# Patient Record
Sex: Female | Born: 1995 | Race: White | Hispanic: No | Marital: Single | Smoking: Former smoker
Health system: Southern US, Community
[De-identification: ages and names within clinical notes are randomized; demographics above are authoritative.]

## PROBLEM LIST (undated history)

## (undated) DIAGNOSIS — O418X10 Other specified disorders of amniotic fluid and membranes, first trimester, not applicable or unspecified: Secondary | ICD-10-CM

## (undated) DIAGNOSIS — R569 Unspecified convulsions: Secondary | ICD-10-CM

## (undated) DIAGNOSIS — G43909 Migraine, unspecified, not intractable, without status migrainosus: Secondary | ICD-10-CM

## (undated) DIAGNOSIS — Z87442 Personal history of urinary calculi: Secondary | ICD-10-CM

## (undated) DIAGNOSIS — Z62820 Parent-biological child conflict: Secondary | ICD-10-CM

## (undated) DIAGNOSIS — M1990 Unspecified osteoarthritis, unspecified site: Secondary | ICD-10-CM

## (undated) DIAGNOSIS — N83209 Unspecified ovarian cyst, unspecified side: Secondary | ICD-10-CM

## (undated) DIAGNOSIS — F419 Anxiety disorder, unspecified: Secondary | ICD-10-CM

## (undated) DIAGNOSIS — F1910 Other psychoactive substance abuse, uncomplicated: Secondary | ICD-10-CM

## (undated) DIAGNOSIS — O468X1 Other antepartum hemorrhage, first trimester: Secondary | ICD-10-CM

## (undated) DIAGNOSIS — R87629 Unspecified abnormal cytological findings in specimens from vagina: Secondary | ICD-10-CM

## (undated) DIAGNOSIS — I10 Essential (primary) hypertension: Secondary | ICD-10-CM

## (undated) DIAGNOSIS — F909 Attention-deficit hyperactivity disorder, unspecified type: Secondary | ICD-10-CM

## (undated) HISTORY — DX: Unspecified abnormal cytological findings in specimens from vagina: R87.629

## (undated) HISTORY — DX: Unspecified ovarian cyst, unspecified side: N83.209

## (undated) HISTORY — PX: WISDOM TOOTH EXTRACTION: SHX21

## (undated) HISTORY — DX: Other antepartum hemorrhage, first trimester: O46.8X1

## (undated) HISTORY — DX: Migraine, unspecified, not intractable, without status migrainosus: G43.909

## (undated) HISTORY — PX: EXTRACORPOREAL SHOCK WAVE LITHOTRIPSY: SHX1557

## (undated) HISTORY — PX: COLPOSCOPY: SHX161

## (undated) HISTORY — DX: Anxiety disorder, unspecified: F41.9

## (undated) HISTORY — DX: Attention-deficit hyperactivity disorder, unspecified type: F90.9

## (undated) HISTORY — DX: Other specified disorders of amniotic fluid and membranes, first trimester, not applicable or unspecified: O41.8X10

## (undated) HISTORY — DX: Parent-biological child conflict: Z62.820

## (undated) HISTORY — DX: Unspecified convulsions: R56.9

---
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
## 2015-03-18 ENCOUNTER — Ambulatory Visit (INDEPENDENT_AMBULATORY_CARE_PROVIDER_SITE_OTHER): Payer: Medicaid Other | Admitting: *Deleted

## 2015-03-18 ENCOUNTER — Encounter: Payer: Self-pay | Admitting: *Deleted

## 2015-03-18 DIAGNOSIS — Z3202 Encounter for pregnancy test, result negative: Secondary | ICD-10-CM

## 2015-03-18 DIAGNOSIS — Z3042 Encounter for surveillance of injectable contraceptive: Secondary | ICD-10-CM

## 2015-03-18 LAB — POCT URINE PREGNANCY: PREG TEST UR: NEGATIVE

## 2015-03-18 MED ORDER — MEDROXYPROGESTERONE ACETATE 150 MG/ML IM SUSP
150.0000 mg | Freq: Once | INTRAMUSCULAR | Status: AC
  Administered 2015-03-18: 150 mg via INTRAMUSCULAR

## 2015-03-18 NOTE — Progress Notes (Signed)
Pt here for Depo. Pt's urine was dark in color. I dipped it and it showed some blood. Pt has a history of kidney stones. Pt not having any pain. I advised to drink plenty of water, flush system out and if she starts having pain, call us or her PCP. Pt voiced understanding. Return in 12 weeks for next shot. JSY

## 2015-06-10 ENCOUNTER — Ambulatory Visit (INDEPENDENT_AMBULATORY_CARE_PROVIDER_SITE_OTHER): Payer: Medicaid Other | Admitting: *Deleted

## 2015-06-10 ENCOUNTER — Encounter: Payer: Self-pay | Admitting: *Deleted

## 2015-06-10 DIAGNOSIS — Z3042 Encounter for surveillance of injectable contraceptive: Secondary | ICD-10-CM | POA: Diagnosis not present

## 2015-06-10 DIAGNOSIS — Z3202 Encounter for pregnancy test, result negative: Secondary | ICD-10-CM | POA: Diagnosis not present

## 2015-06-10 LAB — POCT URINE PREGNANCY: PREG TEST UR: NEGATIVE

## 2015-06-10 MED ORDER — MEDROXYPROGESTERONE ACETATE 150 MG/ML IM SUSP
150.0000 mg | Freq: Once | INTRAMUSCULAR | Status: AC
  Administered 2015-06-10: 150 mg via INTRAMUSCULAR

## 2015-06-10 NOTE — Progress Notes (Signed)
Pt here for Depo. Reports no problems at this time. Return in 12 weeks for next shot. JSY 

## 2015-06-22 ENCOUNTER — Ambulatory Visit: Payer: Medicaid Other | Admitting: Obstetrics & Gynecology

## 2015-06-28 ENCOUNTER — Encounter: Payer: Self-pay | Admitting: Obstetrics & Gynecology

## 2015-06-28 ENCOUNTER — Ambulatory Visit: Payer: Medicaid Other | Admitting: Obstetrics & Gynecology

## 2015-07-10 ENCOUNTER — Emergency Department (HOSPITAL_COMMUNITY)
Admission: EM | Admit: 2015-07-10 | Discharge: 2015-07-10 | Disposition: A | Discharge status: Home / Self Care | Payer: Medicaid Other | Attending: Emergency Medicine | Admitting: Emergency Medicine

## 2015-07-10 ENCOUNTER — Encounter (HOSPITAL_COMMUNITY): Payer: Self-pay | Admitting: Emergency Medicine

## 2015-07-10 DIAGNOSIS — Z8742 Personal history of other diseases of the female genital tract: Secondary | ICD-10-CM | POA: Insufficient documentation

## 2015-07-10 DIAGNOSIS — Z8679 Personal history of other diseases of the circulatory system: Secondary | ICD-10-CM | POA: Diagnosis not present

## 2015-07-10 DIAGNOSIS — K088 Other specified disorders of teeth and supporting structures: Secondary | ICD-10-CM | POA: Diagnosis present

## 2015-07-10 DIAGNOSIS — F419 Anxiety disorder, unspecified: Secondary | ICD-10-CM | POA: Insufficient documentation

## 2015-07-10 DIAGNOSIS — J45909 Unspecified asthma, uncomplicated: Secondary | ICD-10-CM | POA: Diagnosis not present

## 2015-07-10 DIAGNOSIS — K0381 Cracked tooth: Secondary | ICD-10-CM | POA: Diagnosis not present

## 2015-07-10 DIAGNOSIS — Z87442 Personal history of urinary calculi: Secondary | ICD-10-CM | POA: Diagnosis not present

## 2015-07-10 DIAGNOSIS — S02609A Fracture of mandible, unspecified, initial encounter for closed fracture: Secondary | ICD-10-CM

## 2015-07-10 MED ORDER — KETOROLAC TROMETHAMINE 10 MG PO TABS
10.0000 mg | ORAL_TABLET | Freq: Once | ORAL | Status: AC
  Administered 2015-07-10: 10 mg via ORAL
  Filled 2015-07-10: qty 1

## 2015-07-10 MED ORDER — MELOXICAM 15 MG PO TABS: 15.0000 mg | ORAL_TABLET | Freq: Every day | ORAL | Status: DC

## 2015-07-10 MED ORDER — TRAMADOL HCL 50 MG PO TABS: 50.0000 mg | ORAL_TABLET | Freq: Four times a day (QID) | ORAL | Status: DC | PRN

## 2015-07-10 MED ORDER — CLINDAMYCIN HCL 150 MG PO CAPS: ORAL_CAPSULE | ORAL | Status: DC

## 2015-07-10 MED ORDER — PROMETHAZINE HCL 12.5 MG PO TABS
12.5000 mg | ORAL_TABLET | Freq: Once | ORAL | Status: AC
  Administered 2015-07-10: 12.5 mg via ORAL
  Filled 2015-07-10: qty 1

## 2015-07-10 MED ORDER — CLINDAMYCIN HCL 150 MG PO CAPS
300.0000 mg | ORAL_CAPSULE | Freq: Once | ORAL | Status: AC
  Administered 2015-07-10: 300 mg via ORAL
  Filled 2015-07-10: qty 2

## 2015-07-10 MED ORDER — PROMETHAZINE HCL 12.5 MG PO TABS: 12.5000 mg | ORAL_TABLET | Freq: Four times a day (QID) | ORAL | Status: DC | PRN

## 2015-07-10 MED ORDER — TRAMADOL HCL 50 MG PO TABS: ORAL_TABLET | ORAL | Status: DC

## 2015-07-10 MED ORDER — TRAMADOL HCL 50 MG PO TABS
100.0000 mg | ORAL_TABLET | Freq: Once | ORAL | Status: AC
  Administered 2015-07-10: 100 mg via ORAL
  Filled 2015-07-10: qty 2
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
## 2015-07-10 NOTE — ED Provider Notes (Signed)
CSN: [id number]     Arrival date & time 07/10/15  1236 History   First MD Initiated Contact with Patient 07/10/15 1357     Chief Complaint  Patient presents with  . Dental Pain     (Consider location/radiation/quality/duration/timing/severity/associated sxs/prior Treatment) HPI Comments: Patient is a 19 year old Andrea [name] who presents to the emergency department with lower jaw area pain.  The patient states that on Friday, July 1 she had wisdom teeth removed. She continued to have problems and was seen in the dentist's office on Wednesday, July 27. She was told that time that the bottom jaw crack as a result of the wisdom tooth surgery. The patient has an appointment with an oral surgeon on next week, but presents to the emergency department because of pain and discomfort.There has  been no high fever reported. His been no other injury or trauma to the lower jaw area. Patient does complain of difficulty with eating due to the pain of her jaw. She was treated with 3 days of ultram, but states her appointment is not until next week.  Patient is a 19 y.o. Andrea [name] presenting with tooth pain. The history is provided by the patient.  Dental Pain Location:  Lower Associated symptoms: headaches     Past Medical History  Diagnosis Date  . ADHD (attention deficit hyperactivity disorder)   . Anxiety   . Parent-child relational problem   . Asthma   . Kidney stones   . Ovarian cyst   . Migraines    Past Surgical History  Procedure Laterality Date  . Extracorporeal shock wave lithotripsy     Family History  Problem Relation Age of Onset  . Heart disease Other   . Birth defects Other   . Cancer Other   . Diabetes Other    History  Substance Use Topics  . Smoking status: Never Smoker   . Smokeless tobacco: Never Used  . Alcohol Use: No   OB History    No data available     Review of Systems  HENT: Positive for dental problem.   Neurological: Positive for headaches.   Psychiatric/Behavioral: The patient is nervous/anxious.   All other systems reviewed and are negative.     Allergies  Tylenol  Home Medications   Prior to Admission medications   Medication Sig Start Date End Date Taking? Authorizing Provider  medroxyPROGESTERone (DEPO-PROVERA) 150 MG/ML injection INJECT 1 VIAL INTRAMUSCULARLY EVERY 3 MONTHS IN OFFICE. 12/23/14   Adline Potter, NP   BP 143/88 mmHg  Pulse 83  Temp(Src) 98.4 F (36.9 C) (Oral)  Resp 20  Ht 4\' 9"  (1.448 m)  Wt 149 lb (67.586 kg)  BMI 32.23 kg/m2  SpO2 100% Physical Exam  Constitutional: She is oriented to person, place, and time. She appears well-developed and well-nourished.  Non-toxic appearance.  HENT:  Head: Normocephalic.  Right Ear: Tympanic membrane and external ear normal.  Left Ear: Tympanic membrane and external ear normal.  There is swelling of the left lower gum. There is pain to palpation of the left lower jaw. There is mild swelling present. The airway is patent. There is no drooling, no trismus, speech is clear and understandable.  Eyes: EOM and lids are normal. Pupils are equal, round, and reactive to light.  Neck: Normal range of motion. Neck supple. Carotid bruit is not present.  Cardiovascular: Normal rate, regular rhythm, normal heart sounds, intact distal pulses and normal pulses.   Pulmonary/Chest: Breath sounds normal. No respiratory distress.  Abdominal: Soft.  Bowel sounds are normal. There is no tenderness. There is no guarding.  Musculoskeletal: Normal range of motion.  Lymphadenopathy:       Head (right side): No submandibular adenopathy present.       Head (left side): No submandibular adenopathy present.    She has no cervical adenopathy.  Neurological: She is alert and oriented to person, place, and time. She has normal strength. No cranial nerve deficit or sensory deficit.  Skin: Skin is warm and dry.  Psychiatric: She has a normal mood and affect. Her speech is normal.   Nursing note and vitals reviewed.   ED Course  Procedures (including critical care time) Labs Review Labs Reviewed - No data to display  Imaging Review No results found.   EKG Interpretation None      MDM  Vital signs within normal limits. The patient states she has had "green drainage" from the excision site of the wisdom teeth. Patient will be treated with Amoxil 500 mg 3 times daily. The patient complains of increasing pain, she is allergic to Tylenol. Will continue the Ultram, and add Mobic to assist with her discomfort and for inflammation. Patient will follow-up with the oral surgeon next week as scheduled.    Final diagnoses:  None    **I have reviewed nursing notes, vital signs, and all appropriate lab and imaging results for this patient.Ivery Quale, PA-C 07/12/15 Avon Gully, MD 07/20/15 [phone]

## 2015-07-10 NOTE — Discharge Instructions (Signed)
Please use clindamycin 3 times daily, mobile daily with food, Ultram every 6 hours as needed for pain. Please see your oral surgeon as previously scheduled. High protein liquids like boost, [employer], or similar products may be helpful until you're able to chew effectively. Fractured-Jaw Meal Plan The purpose of the fractured-jaw meal plan is to provide foods that can be easily blended and easily swallowed. This plan is typically used after jaw or mouth surgery, wired jaw surgery, or dental surgery. Foods in this plan need to be blended so that they can be sipped from a straw or given through a syringe. You should try to have at least three meals and three snacks daily. It is important to make sure you get enough calories and protein to prevent weight loss and help your body heal, especially after surgery. You may wish to include a liquid multivitamin in your plan to ensure that you get all the vitamins and minerals you need. Ask your health care provider for a recommendation.  HOW DO I PREPARE MY MEALS? All foods in this plan must be blended. Avoid nuts, seeds, skins, peels, bones, or any foods that cannot be blended to the right consistency. Make sure to eat a variety of foods from each food group every day. The following tips can help you as you blend your food:  Remove skins, seeds, and peels from food.  Cook meats and vegetables thoroughly.  Cut foods into small pieces and mix with a small amount of liquid in a food processor or blender. Continue to add liquid until the food becomes thin enough to sip through a straw.  Adding liquids such as juice, milk, cream, broth, gravy, or vegetable juice can help add flavor to foods.  Heat foods after they have been blended to reduce the amount of foam created from blending.  Heat or cool your foods to lukewarm temperatures if your teeth and mouth are sensitive to extreme temperatures. WHAT FOODS CAN I EAT? Make sure to eat a variety  of foods from each food group.  Grains  Hot cereals, such as oatmeal, grits, ground wheat cereals, and polenta.  Rice and pasta.  Couscous. Vegetables  All cooked or canned vegetables, without seeds and skins.  Vegetable juices.  Cooked potatoes, without skins. Fruit  Any cooked or canned fruits, without seeds and skins.  Fresh, peeled soft fruits, such as bananas and peaches, that can be blended until smooth.  All fruit juices, without seeds and skins. Meat and Other Protein Sources  Soft-boiled eggs, scrambled eggs, powdered eggs, pasteurized egg mixtures, and custard.  Ground meats, such as hamburger, [place], sausage, and meatloaf.  Tender, well-cooked meat, poultry, and fish prepared without bones or skin.  Soft soy foods (such as tofu).  Smooth nut butters. Dairy  All are allowed. Beverages  Coffee (regular or decaffeinated), tea, and mineral water. Condiments  All seasonings and condiments that blend well. WHEN MAY I NEED TO SUPPLEMENT MY MEALS? If you begin to lose weight on this plan, you may need to increase the amount of food you are eating or the number of calories in your food or both. You can increase the number of calories by adding any of the following foods:  Protein powder or powdered milk.  Extra fats, such as margarine (without trans fat), sour cream, cream cheese, cream, and nut butters, such as peanut butter or almond butter.  Sweets, such as honey, ice cream, blackstrap molasses, or sugar. Document Released: 05/17/2010 Document Revised:  04/13/2014 Document Reviewed: 10/24/2013 ExitCare Patient Information 2015 [place], [place]. This information is not intended to replace advice given to you by your health care provider. Make sure you discuss any questions you have with your health care provider.

## 2015-09-02 ENCOUNTER — Ambulatory Visit: Payer: Medicaid Other

## 2015-09-03 ENCOUNTER — Ambulatory Visit (INDEPENDENT_AMBULATORY_CARE_PROVIDER_SITE_OTHER): Payer: Medicaid Other | Admitting: *Deleted

## 2015-09-03 ENCOUNTER — Encounter: Payer: Self-pay | Admitting: *Deleted

## 2015-09-03 DIAGNOSIS — Z3200 Encounter for pregnancy test, result unknown: Secondary | ICD-10-CM

## 2015-09-03 DIAGNOSIS — Z3202 Encounter for pregnancy test, result negative: Secondary | ICD-10-CM | POA: Diagnosis not present

## 2015-09-03 DIAGNOSIS — Z3042 Encounter for surveillance of injectable contraceptive: Secondary | ICD-10-CM | POA: Diagnosis not present

## 2015-09-03 LAB — POCT URINE PREGNANCY: Preg Test, Ur: NEGATIVE

## 2015-09-03 MED ORDER — MEDROXYPROGESTERONE ACETATE 150 MG/ML IM SUSP
150.0000 mg | Freq: Once | INTRAMUSCULAR | Status: AC
  Administered 2015-09-03: 150 mg via INTRAMUSCULAR

## 2015-09-03 NOTE — Progress Notes (Signed)
Patient ID: Andrea [name], female   DOB: [birth year], 19 y.o.   MRN: [id number] Pt here today for DEPO injection. Pt denies any problems or concerns at this time.

## 2015-11-26 ENCOUNTER — Ambulatory Visit: Payer: Medicaid Other

## 2015-12-23 ENCOUNTER — Other Ambulatory Visit: Payer: Medicaid Other

## 2015-12-23 ENCOUNTER — Ambulatory Visit (INDEPENDENT_AMBULATORY_CARE_PROVIDER_SITE_OTHER): Payer: Medicaid Other | Admitting: *Deleted

## 2015-12-23 ENCOUNTER — Encounter: Payer: Self-pay | Admitting: *Deleted

## 2015-12-23 DIAGNOSIS — Z3042 Encounter for surveillance of injectable contraceptive: Secondary | ICD-10-CM | POA: Diagnosis not present

## 2015-12-23 DIAGNOSIS — Z3200 Encounter for pregnancy test, result unknown: Secondary | ICD-10-CM

## 2015-12-23 LAB — BETA HCG QUANT (REF LAB): hCG Quant: <1 m[IU]/mL

## 2015-12-23 MED ORDER — MEDROXYPROGESTERONE ACETATE 150 MG/ML IM SUSP
150.0000 mg | Freq: Once | INTRAMUSCULAR | Status: AC
  Administered 2015-12-23: 150 mg via INTRAMUSCULAR

## 2015-12-23 NOTE — Progress Notes (Signed)
Patient ID: Andrea [name], female   DOB: Oct 29, 1996, 20 y.o.   MRN: [id number] Pt here today for DEPO injection. Pt had negative QHCG this morning. Pt tolerated injection well.

## 2016-03-15 ENCOUNTER — Other Ambulatory Visit: Payer: Self-pay | Admitting: Adult Health

## 2016-03-16 ENCOUNTER — Encounter: Payer: Self-pay | Admitting: *Deleted

## 2016-03-16 ENCOUNTER — Ambulatory Visit (INDEPENDENT_AMBULATORY_CARE_PROVIDER_SITE_OTHER): Payer: Medicaid Other | Admitting: *Deleted

## 2016-03-16 DIAGNOSIS — Z3202 Encounter for pregnancy test, result negative: Secondary | ICD-10-CM

## 2016-03-16 DIAGNOSIS — Z3042 Encounter for surveillance of injectable contraceptive: Secondary | ICD-10-CM | POA: Diagnosis not present

## 2016-03-16 LAB — POCT URINE PREGNANCY: PREG TEST UR: NEGATIVE

## 2016-03-16 MED ORDER — MEDROXYPROGESTERONE ACETATE 150 MG/ML IM SUSP
150.0000 mg | Freq: Once | INTRAMUSCULAR | Status: AC
  Administered 2016-03-16: 150 mg via INTRAMUSCULAR

## 2016-03-16 NOTE — Progress Notes (Signed)
Pt here for Depo. Pt tolerated shot well. Return in 12 weeks for next shot. JSY 

## 2016-03-16 NOTE — Addendum Note (Signed)
Addended by: Colen DarlingYOUNG, Juliet Vasbinder S on: 03/16/2016 09:48 AM   Modules accepted: Orders

## 2016-04-24 ENCOUNTER — Encounter (HOSPITAL_COMMUNITY): Payer: Self-pay

## 2016-04-24 ENCOUNTER — Emergency Department (HOSPITAL_COMMUNITY)
Admission: EM | Admit: 2016-04-24 | Discharge: 2016-04-24 | Disposition: A | Discharge status: Home / Self Care | Payer: Medicaid Other | Attending: Emergency Medicine | Admitting: Emergency Medicine

## 2016-04-24 DIAGNOSIS — F17200 Nicotine dependence, unspecified, uncomplicated: Secondary | ICD-10-CM | POA: Diagnosis not present

## 2016-04-24 DIAGNOSIS — K029 Dental caries, unspecified: Secondary | ICD-10-CM | POA: Diagnosis not present

## 2016-04-24 DIAGNOSIS — K047 Periapical abscess without sinus: Secondary | ICD-10-CM

## 2016-04-24 DIAGNOSIS — J45909 Unspecified asthma, uncomplicated: Secondary | ICD-10-CM | POA: Diagnosis not present

## 2016-04-24 DIAGNOSIS — K0889 Other specified disorders of teeth and supporting structures: Secondary | ICD-10-CM | POA: Diagnosis present

## 2016-04-24 MED ORDER — ONDANSETRON HCL 4 MG PO TABS
4.0000 mg | ORAL_TABLET | Freq: Once | ORAL | Status: AC
  Administered 2016-04-24: 4 mg via ORAL
  Filled 2016-04-24: qty 1

## 2016-04-24 MED ORDER — AMOXICILLIN 500 MG PO CAPS: 500.0000 mg | ORAL_CAPSULE | Freq: Three times a day (TID) | ORAL | Status: DC

## 2016-04-24 MED ORDER — CEFTRIAXONE SODIUM 1 G IJ SOLR
1.0000 g | Freq: Once | INTRAMUSCULAR | Status: AC
  Administered 2016-04-24: 1 g via INTRAMUSCULAR
  Filled 2016-04-24: qty 10

## 2016-04-24 MED ORDER — IBUPROFEN 800 MG PO TABS: 800.0000 mg | ORAL_TABLET | Freq: Three times a day (TID) | ORAL | Status: DC

## 2016-04-24 MED ORDER — LIDOCAINE HCL (PF) 1 % IJ SOLN
INTRAMUSCULAR | Status: AC
  Filled 2016-04-24: qty 5

## 2016-04-24 MED ORDER — IBUPROFEN 800 MG PO TABS
800.0000 mg | ORAL_TABLET | Freq: Once | ORAL | Status: AC
  Administered 2016-04-24: 800 mg via ORAL
  Filled 2016-04-24: qty 1

## 2016-04-24 MED ORDER — TRAMADOL HCL 50 MG PO TABS
100.0000 mg | ORAL_TABLET | Freq: Once | ORAL | Status: AC
Start: 2016-04-24 — End: 2016-04-24
  Administered 2016-04-24: 100 mg via ORAL
  Filled 2016-04-24: qty 2

## 2016-04-24 NOTE — ED Notes (Addendum)
Pt reports dental pain left t upper tooth for a month, worsening on Friday. Swelling noted left cheek. Verbalizes  Had wisdom teeth removed first of year

## 2016-04-24 NOTE — Discharge Instructions (Signed)
Dental Caries °Dental caries is tooth decay. This decay can cause a hole in teeth (cavity) that can get bigger and deeper over time. °HOME CARE °· Brush and floss your teeth. Do this at least two times a day. °· Use a fluoride toothpaste. °· Use a mouth rinse if told by your dentist or doctor. °· Eat less sugary and starchy foods. Drink less sugary drinks. °· Avoid snacking often on sugary and starchy foods. Avoid sipping often on sugary drinks. °· Keep regular checkups and cleanings with your dentist. °· Use fluoride supplements if told by your dentist or doctor. °· Allow fluoride to be applied to teeth if told by your dentist or doctor. °  °This information is not intended to replace advice given to you by your health care provider. Make sure you discuss any questions you have with your health care provider. °  °Document Released: 09/05/2008 Document Revised: 12/18/2014 Document Reviewed: 11/29/2012 °Elsevier Interactive Patient Education ©2016 Elsevier Inc. ° °

## 2016-04-24 NOTE — ED Notes (Signed)
Pt stated that she had ibuprofen and amoxicillin at home and that they don't help.  Pt stated that she had amoxicillin at home and that it didn't do any good.  Pt was about to leave without d/c instructions and prescriptions, handed them to her as she was leaving.  Mother had stated that pt had an appt with dentist on Wednesday.  Loney LaurenceH. Bryant, PA made aware of pt stating that the meds she had at home and that pt stated that they did not do any good.  Mother had stated earlier while in room that she would call pt's dentist concerning about getting pain medication.

## 2016-04-24 NOTE — ED Notes (Signed)
Pt with upper left dental pain since Friday.  Pt states she has woke up with blood on her pillow and has spit out brown in color.

## 2016-04-24 NOTE — ED Provider Notes (Signed)
CSN: [id number]     Arrival date & time 04/24/16  1100 History  By signing my name below, I, Placido Sou, attest that this documentation has been prepared under the direction and in the presence of Ivery Quale, PA-C. Electronically Signed: Placido Sou, ED Scribe. 04/24/2016. 12:33 PM.   Chief Complaint  Patient presents with  . Dental Pain   Patient is a 20 y.o. female presenting with tooth pain. The history is provided by the patient. No language interpreter was used.  Dental Pain Location:  Upper Severity:  Moderate Onset quality:  Gradual Duration:  1 month Timing:  Constant Progression:  Worsening Chronicity:  New Context: dental caries   Associated symptoms: facial swelling   Associated symptoms: no fever     HPI Comments: Andrea [name] is a 20 y.o. female who presents to the Emergency Department complaining of constant, moderate, left upper dental pain x 1 month that began worsening 3 days ago. Pt reports associated, mild, facial swelling further noting that she has woken the past 2 days with blood on her pillow and has been producing a brown discharge from the site. She has reached out to her dental provider but was unable to make an appointment on such short notice. She reports a hx of wisdom tooth extraction in early 2017. Pt denies any known allergies, denies currently breastfeeding, denies being on any other rx medications and denies any possibility of pregnancy. She denies fevers, chills and vomiting.    Past Medical History  Diagnosis Date  . ADHD (attention deficit hyperactivity disorder)   . Anxiety   . Parent-child relational problem   . Asthma   . Kidney stones   . Ovarian cyst   . Migraines    Past Surgical History  Procedure Laterality Date  . Extracorporeal shock wave lithotripsy    . Wisdom tooth extraction     Family History  Problem Relation Age of Onset  . Heart disease Other   . Birth defects Other   . Cancer Other   . Diabetes Other     Social History  Substance Use Topics  . Smoking status: Current Every Day Smoker -- 0.50 packs/day  . Smokeless tobacco: Never Used  . Alcohol Use: No   OB History    No data available     Review of Systems  Constitutional: Negative for fever and chills.  HENT: Positive for dental problem and facial swelling.   Gastrointestinal: Negative for vomiting.  All other systems reviewed and are negative.   Allergies  Tylenol  Home Medications   Prior to Admission medications   Medication Sig Start Date End Date Taking? Authorizing Provider  ibuprofen (ADVIL,MOTRIN) 800 MG tablet Take 800 mg by mouth every 8 (eight) hours as needed for moderate pain.    Historical Provider, MD  MedroxyPROGESTERone Acetate 150 MG/ML SUSY INJECT 1 SYRINGE INTRAMUSCULARLY EVERY 3 MONTHS IN OFFICE. 03/15/16   Adline Potter, NP   BP 133/85 mmHg  Pulse 98  Temp(Src) 99.5 F (37.5 C) (Oral)  Resp 16  Ht  (1.549 m)  Wt 140 lb (63.504 kg)  BMI 26.47 kg/m2  SpO2 100%    Physical Exam  Constitutional: She is oriented to person, place, and time. She appears well-developed and well-nourished.  HENT:  Head: Normocephalic and atraumatic.  No temperature changes about the face. Cavity of the left upper posterior molar. No visible abscess. Airway is patent. No swelling beneath the tongue.   Neck: Normal range of motion.  Cardiovascular: Normal rate, regular rhythm, normal heart sounds and intact distal pulses.  Exam reveals no gallop and no friction rub.   No murmur heard. Pulmonary/Chest: Effort normal and breath sounds normal. No respiratory distress. She has no wheezes. She has no rales. She exhibits no tenderness.  Abdominal: Soft.  Musculoskeletal: Normal range of motion.  Neurological: She is alert and oriented to person, place, and time.  Skin: Skin is warm and dry.  Psychiatric: She has a normal mood and affect.  Nursing note and vitals reviewed.   ED Course  Procedures  DIAGNOSTIC  STUDIES: Oxygen Saturation is 100% on RA, normal by my interpretation.    COORDINATION OF CARE: 12:20 PM Discussed next steps with pt including rocephin, amoxil and ibuprofen. She verbalized understanding and is agreeable with the plan.   Labs Review Labs Reviewed - No data to display  Imaging Review No results found.   EKG Interpretation None      MDM  No trismus. No evidence for Ludwig's angina. Pt treated with amoxil and ibuprofen. IM rocephin given in the ED. Pt has appointment with a dentist on Wed 5/17.   Final diagnoses:  None    **I have reviewed nursing notes, vital signs, and all appropriate lab and imaging results for this patient.*  **I personally performed the services described in this documentation, which was scribed in my presence. The recorded information has been reviewed and is accurate.   Ivery QualeHobson Armstead Heiland, PA-C 04/24/16 1306  Geoffery Lyonsouglas Delo, MD 04/24/16 [phone]

## 2016-06-08 ENCOUNTER — Encounter: Payer: Self-pay | Admitting: *Deleted

## 2016-06-08 ENCOUNTER — Ambulatory Visit (INDEPENDENT_AMBULATORY_CARE_PROVIDER_SITE_OTHER): Payer: Medicaid Other | Admitting: *Deleted

## 2016-06-08 DIAGNOSIS — Z3202 Encounter for pregnancy test, result negative: Secondary | ICD-10-CM | POA: Diagnosis not present

## 2016-06-08 DIAGNOSIS — Z3042 Encounter for surveillance of injectable contraceptive: Secondary | ICD-10-CM | POA: Diagnosis not present

## 2016-06-08 LAB — POCT URINE PREGNANCY: Preg Test, Ur: NEGATIVE

## 2016-06-08 MED ORDER — MEDROXYPROGESTERONE ACETATE 150 MG/ML IM SUSP
150.0000 mg | Freq: Once | INTRAMUSCULAR | Status: AC
  Administered 2016-06-08: 150 mg via INTRAMUSCULAR

## 2016-06-08 NOTE — Progress Notes (Signed)
Pt here for Depo. Pt tolerated shot well. Return in 12 weeks for next shot. JSY 

## 2016-07-03 ENCOUNTER — Telehealth: Payer: Self-pay | Admitting: Obstetrics & Gynecology

## 2016-07-03 NOTE — Telephone Encounter (Signed)
Pharmacy changed to [facility] [place] in pt chart.

## 2016-07-03 NOTE — Telephone Encounter (Signed)
Pt called stating that she would like to switch pharmacy. Pt states that she would like to use [facility] [place] now. Pt does not need a call back

## 2016-08-31 ENCOUNTER — Encounter: Payer: Self-pay | Admitting: *Deleted

## 2016-08-31 ENCOUNTER — Ambulatory Visit (INDEPENDENT_AMBULATORY_CARE_PROVIDER_SITE_OTHER): Payer: Medicaid Other | Admitting: *Deleted

## 2016-08-31 DIAGNOSIS — Z3202 Encounter for pregnancy test, result negative: Secondary | ICD-10-CM

## 2016-08-31 DIAGNOSIS — Z3042 Encounter for surveillance of injectable contraceptive: Secondary | ICD-10-CM | POA: Diagnosis not present

## 2016-08-31 LAB — POCT URINE PREGNANCY: Preg Test, Ur: NEGATIVE

## 2016-08-31 MED ORDER — MEDROXYPROGESTERONE ACETATE 150 MG/ML IM SUSP
150.0000 mg | Freq: Once | INTRAMUSCULAR | Status: AC
  Administered 2016-08-31: 150 mg via INTRAMUSCULAR

## 2016-08-31 NOTE — Progress Notes (Signed)
Depo Provera 150 mg IM left deltoid with no complications, negative pregnancy test. Pt to return in 12 weeks for next injection.

## 2016-11-23 ENCOUNTER — Ambulatory Visit: Payer: Medicaid Other

## 2016-11-23 ENCOUNTER — Encounter: Payer: Self-pay | Admitting: *Deleted

## 2016-11-29 ENCOUNTER — Other Ambulatory Visit: Payer: Self-pay | Admitting: Adult Health

## 2016-11-30 ENCOUNTER — Ambulatory Visit (INDEPENDENT_AMBULATORY_CARE_PROVIDER_SITE_OTHER): Payer: Medicaid Other | Admitting: *Deleted

## 2016-11-30 ENCOUNTER — Encounter: Payer: Self-pay | Admitting: *Deleted

## 2016-11-30 DIAGNOSIS — Z3202 Encounter for pregnancy test, result negative: Secondary | ICD-10-CM

## 2016-11-30 DIAGNOSIS — Z3042 Encounter for surveillance of injectable contraceptive: Secondary | ICD-10-CM | POA: Diagnosis not present

## 2016-11-30 DIAGNOSIS — Z308 Encounter for other contraceptive management: Secondary | ICD-10-CM

## 2016-11-30 LAB — POCT URINE PREGNANCY: PREG TEST UR: NEGATIVE

## 2016-11-30 MED ORDER — MEDROXYPROGESTERONE ACETATE 150 MG/ML IM SUSP
150.0000 mg | Freq: Once | INTRAMUSCULAR | Status: AC
  Administered 2016-11-30: 150 mg via INTRAMUSCULAR

## 2016-11-30 NOTE — Progress Notes (Signed)
Pt here for Depo. Pt tolerated shot well. Return in 12 weeks for next shot. JSY 

## 2017-02-22 ENCOUNTER — Ambulatory Visit: Payer: Medicaid Other

## 2017-02-28 ENCOUNTER — Other Ambulatory Visit (HOSPITAL_COMMUNITY)
Admission: RE | Admit: 2017-02-28 | Discharge: 2017-02-28 | Disposition: A | Discharge status: Home / Self Care | Payer: Medicaid Other | Source: Ambulatory Visit | Attending: Obstetrics & Gynecology | Admitting: Obstetrics & Gynecology

## 2017-02-28 ENCOUNTER — Ambulatory Visit (INDEPENDENT_AMBULATORY_CARE_PROVIDER_SITE_OTHER): Payer: Medicaid Other | Admitting: Women's Health

## 2017-02-28 ENCOUNTER — Encounter: Payer: Self-pay | Admitting: Women's Health

## 2017-02-28 VITALS — BP 120/78 | HR 80 | Ht 59.0 in | Wt 135.0 lb

## 2017-02-28 DIAGNOSIS — Z308 Encounter for other contraceptive management: Secondary | ICD-10-CM

## 2017-02-28 DIAGNOSIS — Z3009 Encounter for other general counseling and advice on contraception: Secondary | ICD-10-CM

## 2017-02-28 DIAGNOSIS — Z01419 Encounter for gynecological examination (general) (routine) without abnormal findings: Secondary | ICD-10-CM | POA: Insufficient documentation

## 2017-02-28 DIAGNOSIS — K5900 Constipation, unspecified: Secondary | ICD-10-CM | POA: Insufficient documentation

## 2017-02-28 DIAGNOSIS — Z113 Encounter for screening for infections with a predominantly sexual mode of transmission: Secondary | ICD-10-CM | POA: Insufficient documentation

## 2017-02-28 NOTE — Progress Notes (Signed)
Subjective:   Andrea [name] is a 21 y.o. G0P0000 Caucasian female here for a routine well-woman exam.  No LMP recorded. Patient has had an injection.    Current complaints: constipation, sometimes can go 'weeks' w/o bm, taking stool softener PCP: Denny Peon or Katie at [facility]       Does not desire labs, other than required FP [facility] STD screen  Social History: Sexual: heterosexual Marital Status: single Living situation: with mother Occupation: unemployed Tobacco/alcohol: 1/2ppd cigarettes, etoh: none Illicit drugs: no history of illicit drug use  The following portions of the patient's history were reviewed and updated as appropriate: allergies, current medications, past family history, past medical history, past social history, past surgical history and problem list.  Past Medical History Past Medical History:  Diagnosis Date  . ADHD (attention deficit hyperactivity disorder)   . Anxiety   . Asthma   . Kidney stones   . Migraines   . Ovarian cyst   . Parent-child relational problem     Past Surgical History Past Surgical History:  Procedure Laterality Date  . EXTRACORPOREAL SHOCK WAVE LITHOTRIPSY    . WISDOM TOOTH EXTRACTION      Gynecologic History G0P0000  No LMP recorded. Patient has had an injection. Contraception: Depo-Provera injections Last Pap: never. Results were: n/a Last mammogram: never. Results were: n/a Last TCS: never  Obstetric History OB History  Gravida Para Term Preterm AB Living  0 0 0 0 0 0  SAB TAB Ectopic Multiple Live Births  0 0 0 0 0        Current Medications Current Outpatient Prescriptions on File Prior to Visit  Medication Sig Dispense Refill  . ALPRAZolam (XANAX) 1 MG tablet Take 1 mg by mouth 2 (two) times daily.  2  . baclofen (LIORESAL) 10 MG tablet TAKE 1 TABLET THREE TIMES A DAY AS NEEDED FOR MUSCLE SPASM  0  . ibuprofen (ADVIL,MOTRIN) 800 MG tablet Take 1 tablet (800 mg total) by mouth 3 (three) times daily. Please take  with food (Patient taking differently: Take 800 mg by mouth as needed. Please take with food) 21 tablet 0  . MedroxyPROGESTERone Acetate 150 MG/ML SUSY INJECT 1 SYRINGE INTRAMUSCULARLY EVERY 3 MONTHS IN OFFICE. 1 mL 0  . venlafaxine (EFFEXOR) 75 MG tablet Take 75 mg by mouth 2 (two) times daily.  3   No current facility-administered medications on file prior to visit.     Review of Systems Patient denies any headaches, blurred vision, shortness of breath, chest pain, abdominal pain, problems with urination, or intercourse.  Objective:  BP 120/78 (BP Location: Right Arm, Patient Position: Sitting, Cuff Size: Normal)   Pulse 80   Ht 4\' 11"  (1.499 m)   Wt 135 lb (61.2 kg)   BMI 27.27 kg/m  Physical Exam  General:  Well developed, well nourished, no acute distress. She is alert and oriented x3. Skin:  Warm and dry Neck:  Midline trachea, no thyromegaly or nodules Cardiovascular: Regular rate and rhythm, no murmur heard Lungs:  Effort normal, all lung fields clear to auscultation bilaterally Breasts:  No dominant palpable mass, retraction, or nipple discharge Abdomen:  Soft, non tender, no hepatosplenomegaly or masses Pelvic:  External genitalia is normal in appearance.  The vagina is normal in appearance. The cervix is bulbous, no CMT.  Thin prep pap is done w/ reflex HR HPV cotesting. Uterus is felt to be normal size, shape, and contour.  No adnexal masses or tenderness noted. Extremities:  No swelling  or varicosities noted Psych:  She has a normal mood and affect  Assessment:   Healthy well-woman exam FP [facility] STD screen Constipation  Plan:  GC/CT from pap, HIV, RPR today F/U tomorrow am for depo, then [id number] for physical, or sooner if needed Gave printed prevention/relief measures for constipation, if not helping to discuss w/ PCP Mammogram @21yo  or sooner if problems Colonoscopy @21yo  or sooner if problems  Marge DuncansBooker, Katelen Luepke Randall CNM, [facility]NP-BC 02/28/2017 12:16 PM

## 2017-02-28 NOTE — Patient Instructions (Signed)

## 2017-03-01 ENCOUNTER — Ambulatory Visit (INDEPENDENT_AMBULATORY_CARE_PROVIDER_SITE_OTHER): Payer: Medicaid Other | Admitting: *Deleted

## 2017-03-01 ENCOUNTER — Encounter: Payer: Self-pay | Admitting: *Deleted

## 2017-03-01 DIAGNOSIS — Z3042 Encounter for surveillance of injectable contraceptive: Secondary | ICD-10-CM

## 2017-03-01 DIAGNOSIS — Z3202 Encounter for pregnancy test, result negative: Secondary | ICD-10-CM

## 2017-03-01 DIAGNOSIS — Z3049 Encounter for surveillance of other contraceptives: Secondary | ICD-10-CM | POA: Diagnosis not present

## 2017-03-01 LAB — POCT URINE PREGNANCY: PREG TEST UR: NEGATIVE

## 2017-03-01 LAB — HIV ANTIBODY (ROUTINE TESTING W REFLEX): HIV Screen 4th Generation wRfx: NONREACTIVE

## 2017-03-01 LAB — RPR: RPR Ser Ql: NONREACTIVE

## 2017-03-01 MED ORDER — MEDROXYPROGESTERONE ACETATE 150 MG/ML IM SUSP
150.0000 mg | Freq: Once | INTRAMUSCULAR | Status: AC
  Administered 2017-03-01: 150 mg via INTRAMUSCULAR

## 2017-03-01 NOTE — Progress Notes (Signed)
Pt here for Depo. Pt tolerated shot well. Return in 12 weeks for next shot. JSY 

## 2017-03-02 ENCOUNTER — Telehealth: Payer: Self-pay | Admitting: *Deleted

## 2017-03-02 LAB — CYTOLOGY - PAP
Chlamydia: NEGATIVE
Diagnosis: HIGH — AB
Neisseria Gonorrhea: NEGATIVE

## 2017-03-02 NOTE — Telephone Encounter (Signed)
Andrea BondsGloria from [facility] called to report an abnormal PAP on pt.  Report is in Epic but wanted to make sure Andrea [name], CNM looks at results.

## 2017-03-05 ENCOUNTER — Telehealth: Payer: Self-pay | Admitting: *Deleted

## 2017-03-07 ENCOUNTER — Encounter: Payer: Self-pay | Admitting: *Deleted

## 2017-03-08 ENCOUNTER — Telehealth: Payer: Self-pay | Admitting: *Deleted

## 2017-03-08 ENCOUNTER — Telehealth: Payer: Self-pay | Admitting: Women's Health

## 2017-03-08 ENCOUNTER — Encounter: Payer: Self-pay | Admitting: Women's Health

## 2017-03-08 NOTE — Telephone Encounter (Signed)
Sent Patient a letter stating that she would need to make an appointment with our office as soon as possible for a Colpo appointment

## 2017-03-08 NOTE — Telephone Encounter (Signed)
Attempted to call all numbers listed for patient. None of the numbers go to [facility]. Letter sent to inform patient of need to schedule appointment.

## 2017-03-12 ENCOUNTER — Encounter: Payer: Self-pay | Admitting: Women's Health

## 2017-03-12 ENCOUNTER — Telehealth: Payer: Self-pay | Admitting: Women's Health

## 2017-03-12 ENCOUNTER — Telehealth: Payer: Self-pay | Admitting: *Deleted

## 2017-03-12 DIAGNOSIS — R87619 Unspecified abnormal cytological findings in specimens from cervix uteri: Secondary | ICD-10-CM | POA: Insufficient documentation

## 2017-03-12 NOTE — Telephone Encounter (Signed)
Patient called very tearful about abnormal pap, concerned she may have cancer. Informed patient that she needed a colpo to further testing to examine the cells more closely. Explained to patient what a colposcopy was. Pt verbalized understanding.

## 2017-03-12 NOTE — Telephone Encounter (Signed)
Still attempting to contact pt regarding abnormal pap/need for colpo. No answer, no option to leave voicemail. Certified letter sent last week. Will continue trying to contact.  Cheral Marker, CNM, WHNP-BC

## 2017-03-13 NOTE — Telephone Encounter (Signed)
Was unable to reach pt, she has since called back and spoke with Tish.

## 2017-03-22 ENCOUNTER — Encounter: Payer: Medicaid Other | Admitting: Obstetrics and Gynecology

## 2017-04-02 ENCOUNTER — Encounter: Payer: Medicaid Other | Admitting: Obstetrics and Gynecology

## 2017-04-09 ENCOUNTER — Encounter: Payer: Medicaid Other | Admitting: Obstetrics & Gynecology

## 2017-04-09 ENCOUNTER — Encounter: Payer: Self-pay | Admitting: *Deleted

## 2017-04-24 ENCOUNTER — Ambulatory Visit (INDEPENDENT_AMBULATORY_CARE_PROVIDER_SITE_OTHER): Payer: Self-pay | Admitting: Obstetrics & Gynecology

## 2017-04-24 ENCOUNTER — Encounter: Payer: Self-pay | Admitting: Obstetrics & Gynecology

## 2017-04-24 ENCOUNTER — Other Ambulatory Visit: Payer: Self-pay | Admitting: Obstetrics & Gynecology

## 2017-04-24 VITALS — BP 104/60 | HR 76 | Wt 132.0 lb

## 2017-04-24 DIAGNOSIS — R87613 High grade squamous intraepithelial lesion on cytologic smear of cervix (HGSIL): Secondary | ICD-10-CM

## 2017-04-24 DIAGNOSIS — Z3202 Encounter for pregnancy test, result negative: Secondary | ICD-10-CM

## 2017-04-24 LAB — POCT URINE PREGNANCY: PREG TEST UR: NEGATIVE

## 2017-04-24 NOTE — Progress Notes (Signed)
Colposcopy Procedure Note:  Colposcopy Procedure Note  Indications: Pap smear 2 months ago showed: high-grade squamous intraepithelial neoplasia  (HGSIL-encompassing moderate and severe dysplasia). The prior pap showed this is her first Pap.  Prior cervical/vaginal disease: this is her first Pap. Prior cervical treatment: n/a.  Smoker:  Yes.   New sexual partner:  No.  : time frame:    History of abnormal Pap: no ths was her first Pap smear  Procedure Details  The risks and benefits of the procedure and Written informed consent obtained.  Speculum placed in vagina and excellent visualization of cervix achieved, cervix swabbed x 3 with acetic acid solution.  Findings: Cervix: visible lesion(s) at 12  o'clock, acetowhite lesion(s) noted at 12 o'clock, punctation noted at 12 o'clock and mosaicism noted at 12 o'clock; SCJ visualized - lesion at 12 o'clock, cervical biopsies taken at 12 o'clock, specimen labelled and sent to pathology and hemostasis achieved with Monsel's solution. Vaginal inspection: normal without visible lesions. Vulvar colposcopy: vulvar colposcopy not performed.  Specimens: cervical biopsy  Complications: none.  Plan: Specimens labelled and sent to Pathology. Return to discuss Pathology results in 1 week.

## 2017-04-24 NOTE — Addendum Note (Signed)
Addended by: Tish FredericksonLANCASTER, Kennice Finnie A on: 04/24/2017 10:24 AM   Modules accepted: Orders

## 2017-05-04 ENCOUNTER — Encounter: Payer: Self-pay | Admitting: Obstetrics & Gynecology

## 2017-05-04 ENCOUNTER — Ambulatory Visit (INDEPENDENT_AMBULATORY_CARE_PROVIDER_SITE_OTHER): Payer: Self-pay | Admitting: Obstetrics & Gynecology

## 2017-05-04 VITALS — BP 110/58 | HR 75 | Ht <= 58 in | Wt 131.0 lb

## 2017-05-04 DIAGNOSIS — Z3202 Encounter for pregnancy test, result negative: Secondary | ICD-10-CM

## 2017-05-04 DIAGNOSIS — R87613 High grade squamous intraepithelial lesion on cytologic smear of cervix (HGSIL): Secondary | ICD-10-CM

## 2017-05-04 LAB — POCT URINE PREGNANCY: Preg Test, Ur: NEGATIVE

## 2017-05-04 NOTE — Progress Notes (Signed)
Preoperative History and Physical  Andrea [name] is a 21 y.o. G0P0000 with No LMP recorded. Patient has had an injection. admitted for a colposcopically directed biopsies reveal HSIL, colposcopy revealed a large expansive cervical lesion which requires laser ablation of the cervical lesion.    PMH:    Past Medical History:  Diagnosis Date  . ADHD (attention deficit hyperactivity disorder)   . Anxiety   . Asthma   . Kidney stones   . Migraines   . Ovarian cyst   . Parent-child relational problem     PSH:     Past Surgical History:  Procedure Laterality Date  . COLPOSCOPY    . EXTRACORPOREAL SHOCK WAVE LITHOTRIPSY    . WISDOM TOOTH EXTRACTION      POb/GynH:      OB History    Gravida Para Term Preterm AB Living   0 0 0 0 0 0   SAB TAB Ectopic Multiple Live Births   0 0 0 0 0      SH:   Social History  Substance Use Topics  . Smoking status: Current Every Day Smoker    Packs/day: 0.50    Years: 3.00    Types: Cigarettes  . Smokeless tobacco: Never Used  . Alcohol use No    FH:    Family History  Problem Relation Age of Onset  . Heart disease Other   . Birth defects Other   . Cancer Other   . Diabetes Other      Allergies:  Allergies  Allergen Reactions  . Tylenol [Acetaminophen] Hives    Medications:       Current Outpatient Prescriptions:  .  ALPRAZolam (XANAX) 1 MG tablet, Take 1 mg by mouth 3 (three) times daily. , Disp: , Rfl: 2 .  baclofen (LIORESAL) 10 MG tablet, TAKE 1 TABLET THREE TIMES A DAY AS NEEDED FOR MUSCLE SPASM, Disp: , Rfl: 0 .  ibuprofen (ADVIL,MOTRIN) 800 MG tablet, Take 1 tablet (800 mg total) by mouth 3 (three) times daily. Please take with food (Patient taking differently: Take 800 mg by mouth as needed. Please take with food), Disp: 21 tablet, Rfl: 0 .  MedroxyPROGESTERone Acetate 150 MG/ML SUSY, INJECT 1 SYRINGE INTRAMUSCULARLY EVERY 3 MONTHS IN OFFICE., Disp: 1 mL, Rfl: 0 .  venlafaxine (EFFEXOR) 75 MG tablet, Take 75 mg by  mouth 2 (two) times daily., Disp: , Rfl: 3  Review of Systems:   Review of Systems  Constitutional: Negative for fever, chills, weight loss, malaise/fatigue and diaphoresis.  HENT: Negative for hearing loss, ear pain, nosebleeds, congestion, sore throat, neck pain, tinnitus and ear discharge.   Eyes: Negative for blurred vision, double vision, photophobia, pain, discharge and redness.  Respiratory: Negative for cough, hemoptysis, sputum production, shortness of breath, wheezing and stridor.   Cardiovascular: Negative for chest pain, palpitations, orthopnea, claudication, leg swelling and PND.  Gastrointestinal: Positive for abdominal pain. Negative for heartburn, nausea, vomiting, diarrhea, constipation, blood in stool and melena.  Genitourinary: Negative for dysuria, urgency, frequency, hematuria and flank pain.  Musculoskeletal: Negative for myalgias, back pain, joint pain and falls.  Skin: Negative for itching and rash.  Neurological: Negative for dizziness, tingling, tremors, sensory change, speech change, focal weakness, seizures, loss of consciousness, weakness and headaches.  Endo/Heme/Allergies: Negative for environmental allergies and polydipsia. Does not bruise/bleed easily.  Psychiatric/Behavioral: Negative for depression, suicidal ideas, hallucinations, memory loss and substance abuse. The patient is not nervous/anxious and does not have insomnia.  PHYSICAL EXAM:  Blood pressure (!) 110/58, pulse 75, height 4\' 9"  (1.448 m), weight 131 lb (59.4 kg).    Vitals reviewed. Constitutional: She is oriented to person, place, and time. She appears well-developed and well-nourished.  HENT:  Head: Normocephalic and atraumatic.  Right Ear: External ear normal.  Left Ear: External ear normal.  Nose: Nose normal.  Mouth/Throat: Oropharynx is clear and moist.  Eyes: Conjunctivae and EOM are normal. Pupils are equal, round, and reactive to light. Right eye exhibits no discharge. Left  eye exhibits no discharge. No scleral icterus.  Neck: Normal range of motion. Neck supple. No tracheal deviation present. No thyromegaly present.  Cardiovascular: Normal rate, regular rhythm, normal heart sounds and intact distal pulses.  Exam reveals no gallop and no friction rub.   No murmur heard. Respiratory: Effort normal and breath sounds normal. No respiratory distress. She has no wheezes. She has no rales. She exhibits no tenderness.  GI: Soft. Bowel sounds are normal. She exhibits no distension and no mass. There is tenderness. There is no rebound and no guarding.  Genitourinary:       Vulva is normal without lesions Vagina is pink moist without discharge Cervix normal in appearance grossly , see colpo note Uterus is normal size, contour, position, consistency, mobility, non-tender Adnexa is negative with normal sized ovaries by sonogram  Musculoskeletal: Normal range of motion. She exhibits no edema and no tenderness.  Neurological: She is alert and oriented to person, place, and time. She has normal reflexes. She displays normal reflexes. No cranial nerve deficit. She exhibits normal muscle tone. Coordination normal.  Skin: Skin is warm and dry. No rash noted. No erythema. No pallor.  Psychiatric: She has a normal mood and affect. Her behavior is normal. Judgment and thought content normal.    Labs: Results for orders placed or performed in visit on 05/04/17 (from the past 336 hour(s))  POCT urine pregnancy   Collection Time: 05/04/17 11:26 AM  Result Value Ref Range   Preg Test, Ur Negative Negative  Results for orders placed or performed in visit on 04/24/17 (from the past 336 hour(s))  POCT urine pregnancy   Collection Time: 04/24/17  9:40 AM  Result Value Ref Range   Preg Test, Ur Negative Negative    EKG: No orders found for this or any previous visit.  Imaging Studies: No results found.    Assessment: High grade squamous intraepithelial cervical dysplasia -  large expansive lesion will require laser ablation, laser conization would be too destructive to the cervix  Pregnancy examination or test, negative result - Plan: POCT urine pregnancy   Patient Active Problem List   Diagnosis Date Noted  . Abnormal Pap smear of cervix 03/12/2017  . Constipation 02/28/2017    Plan: Laser conization of the cervix due to high grade dysplasia with a very large lesion: 05/23/2017  EURE,LUTHER H 05/04/2017 12:05 PM      Face to face time:  25 minutes  Greater than 50% of the visit time was spent in counseling and coordination of care with the patient.  The summary and outline of the counseling and care coordination is summarized in the note above.   All questions were answered.

## 2017-05-15 NOTE — Patient Instructions (Signed)
Andrea [name]  05/15/2017     @PREFPERIOPPHARMACY @   Your procedure is scheduled on 05/23/2017.  Report to [facility] at 9:30 A.M.  Call this number if you have problems the morning of surgery:  [phone]   Remember:  Do not eat food or drink liquids after midnight.  Take these medicines the morning of surgery with A SIP OF WATER Xanax, Celexa, Baclofen   Do not wear jewelry, make-up or nail polish.  Do not wear lotions, powders, or perfumes, or deoderant.  Do not shave 48 hours prior to surgery.  Men may shave face and neck.  Do not bring valuables to the hospital.  [facility] is not responsible for any belongings or valuables.  Contacts, dentures or bridgework may not be worn into surgery.  Leave your suitcase in the car.  After surgery it may be brought to your room.  For patients admitted to the hospital, discharge time will be determined by your treatment team.  Patients discharged the day of surgery will not be allowed to drive home.    Please read over the following fact sheets that you were given. Surgical Site Infection Prevention and Anesthesia Post-op Instructions     PATIENT INSTRUCTIONS POST-ANESTHESIA  IMMEDIATELY FOLLOWING SURGERY:  Do not drive or operate machinery for the first twenty four hours after surgery.  Do not make any important decisions for twenty four hours after surgery or while taking narcotic pain medications or sedatives.  If you develop intractable nausea and vomiting or a severe headache please notify your doctor immediately.  FOLLOW-UP:  Please make an appointment with your surgeon as instructed. You do not need to follow up with anesthesia unless specifically instructed to do so.  WOUND CARE INSTRUCTIONS (if applicable):  Keep a dry clean dressing on the anesthesia/puncture wound site if there is drainage.  Once the wound has quit draining you may leave it open to air.  Generally you should leave the bandage intact for twenty four  hours unless there is drainage.  If the epidural site drains for more than 36-48 hours please call the anesthesia department.  QUESTIONS?:  Please feel free to call your physician or the hospital operator if you have any questions, and they will be happy to assist you.      Cervical Laser Surgery Cervical laser surgery is a procedure that uses a focused beam of light (laser) to shrink, destroy, or remove tissue from the opening of the uterus (cervix). You may have this surgery if:  You have abnormal cells (dysplasia) in your cervix that are an early sign of cancer.  Tissue from your cervix is to be tested for signs of disease.  Tell a health care provider about:  Any allergies you have.  All medicines you are taking, including vitamins, herbs, eye drops, creams, and over-the-counter medicines.  Any problems you or family members have had with anesthetic medicines.  Any blood disorders you have.  Any surgeries you have had.  Any medical conditions you have.  Whether you are pregnant or may be pregnant. What are the risks? Generally, this is a safe procedure. However, problems may occur, including:  Bleeding.  Infection.  Allergic reactions to medicines or dyes.  Damage to other structures or organs.  Narrowing of the cervix (cervical stenosis). This can lead to infertility, cervical incompetency, and miscarriage or preterm labor in a future pregnancy.  What happens before the procedure?  Ask your health care provider about changing or stopping  your regular medicines. This is especially important if you are taking diabetes medicines or blood thinners.  For at least 24 hours before the procedure: ? Do not have sex. ? Do not use tampons. ? Do not douche. ? Do not use vaginal creams or medicines. What happens during the procedure?  To lower your risk of infection, your health care team will wash or sanitize their hands.  You will lie on your back with your feet raised  in footrests (stirrups).  A device called a speculum will be put into your vagina to hold it open.  You will be given a medicine to numb the area (local anesthetic). You may feel cramping when the medicine is injected.  A magnifying instrument called a colposcope will be placed into your vagina. It will shine a light and allow your health care provider to see your vagina and cervix.  A solution will be swabbed on your cervix to help your health care provider see the tissue.  A thin, flexible tube called an endoscope will be inserted into your vagina. The laser will be on the end of the endoscope.  The laser will be used to shrink, destroy, or remove the tissue.  A cotton swab soaked in solution may be used to remove any burned or destroyed tissue.  A paste may be applied to your cervix to stop bleeding. The procedure may vary among health care providers and hospitals. What happens after the procedure?  You may feel some pain or cramping for a few days.  You may have some bleeding and brownish discharge.  Wear sanitary pads for discharge.  Do not have sex or put anything in your vagina until your health care provider says it is okay.  Your health care provider may recommend that you limit your physical activity for a few days. Ask your health care provider what activities are safe for you. Summary  Cervical laser surgery is a procedure that uses a focused beam of light (laser) to shrink, destroy, or remove tissue from the cervix.  You may have this procedure if you have abnormal cells in your cervix that are an early sign of cancer, or if tissue from your cervix is to be tested for signs of disease.  Generally, this is a safe procedure. However, problems may occur.  Do not have sex, use tampons, douche, or use vaginal creams or medicines for at least 24 hours before the procedure.  You may have pain, cramping, bleeding, and discharge for a few days after the procedure. This  information is not intended to replace advice given to you by your health care provider. Make sure you discuss any questions you have with your health care provider. Document Released: 10/16/2016 Document Revised: 10/16/2016 Document Reviewed: 10/16/2016 Elsevier Interactive Patient Education  [employer]2018 [employer]

## 2017-05-17 ENCOUNTER — Encounter (HOSPITAL_COMMUNITY): Payer: Self-pay

## 2017-05-17 ENCOUNTER — Encounter (HOSPITAL_COMMUNITY)
Admission: RE | Admit: 2017-05-17 | Discharge: 2017-05-17 | Disposition: A | Discharge status: Home / Self Care | Payer: Medicaid Other | Source: Ambulatory Visit | Attending: Obstetrics & Gynecology | Admitting: Obstetrics & Gynecology

## 2017-05-17 ENCOUNTER — Inpatient Hospital Stay (HOSPITAL_COMMUNITY): Admission: RE | Admit: 2017-05-17 | Payer: Medicaid Other | Source: Ambulatory Visit

## 2017-05-17 DIAGNOSIS — Z01812 Encounter for preprocedural laboratory examination: Secondary | ICD-10-CM | POA: Insufficient documentation

## 2017-05-17 HISTORY — DX: Unspecified osteoarthritis, unspecified site: M19.90

## 2017-05-17 HISTORY — DX: Personal history of urinary calculi: Z87.442

## 2017-05-17 LAB — COMPREHENSIVE METABOLIC PANEL
ALT: 22 U/L (ref 14–54)
AST: 23 U/L (ref 15–41)
Albumin: 4.2 g/dL (ref 3.5–5.0)
Alkaline Phosphatase: 73 U/L (ref 38–126)
Anion gap: 8 (ref 5–15)
BILIRUBIN TOTAL: 0.4 mg/dL (ref 0.3–1.2)
BUN: 13 mg/dL (ref 6–20)
CO2: 23 mmol/L (ref 22–32)
CREATININE: 0.75 mg/dL (ref 0.44–1.00)
Calcium: 9 mg/dL (ref 8.9–10.3)
Chloride: 106 mmol/L (ref 101–111)
GFR calc Af Amer: >60 mL/min (ref >60)
Glucose, Bld: 71 mg/dL (ref 65–99)
POTASSIUM: 3.4 mmol/L — AB (ref 3.5–5.1)
Sodium: 137 mmol/L (ref 135–145)
TOTAL PROTEIN: 7.5 g/dL (ref 6.5–8.1)

## 2017-05-17 LAB — URINALYSIS, ROUTINE W REFLEX MICROSCOPIC
BILIRUBIN URINE: NEGATIVE
GLUCOSE, UA: NEGATIVE mg/dL
KETONES UR: NEGATIVE mg/dL
NITRITE: NEGATIVE
PROTEIN: NEGATIVE mg/dL
Specific Gravity, Urine: 1.017 (ref 1.005–1.030)
pH: 5 (ref 5.0–8.0)

## 2017-05-17 LAB — CBC
HCT: 40.9 % (ref 36.0–46.0)
Hemoglobin: 14 g/dL (ref 12.0–15.0)
MCH: 31 pg (ref 26.0–34.0)
MCHC: 34.2 g/dL (ref 30.0–36.0)
MCV: 90.5 fL (ref 78.0–100.0)
PLATELETS: 295 10*3/uL (ref 150–400)
RBC: 4.52 MIL/uL (ref 3.87–5.11)
RDW: 12.6 % (ref 11.5–15.5)
WBC: 7.7 10*3/uL (ref 4.0–10.5)

## 2017-05-17 LAB — RAPID HIV SCREEN (HIV 1/2 AB+AG)
HIV 1/2 Antibodies: NONREACTIVE
HIV-1 P24 Antigen - HIV24: NONREACTIVE

## 2017-05-17 LAB — SURGICAL PCR SCREEN
MRSA, PCR: NEGATIVE
Staphylococcus aureus: NEGATIVE

## 2017-05-17 LAB — HCG, QUANTITATIVE, PREGNANCY

## 2017-05-17 LAB — TSH: TSH: 1.17 u[IU]/mL (ref 0.350–4.500)

## 2017-05-23 ENCOUNTER — Ambulatory Visit (HOSPITAL_COMMUNITY)
Admission: RE | Admit: 2017-05-23 | Discharge: 2017-05-23 | Disposition: A | Discharge status: Home / Self Care | Payer: Self-pay | Source: Ambulatory Visit | Attending: Obstetrics & Gynecology | Admitting: Obstetrics & Gynecology

## 2017-05-23 ENCOUNTER — Ambulatory Visit (HOSPITAL_COMMUNITY): Payer: Self-pay | Admitting: Anesthesiology

## 2017-05-23 ENCOUNTER — Encounter (HOSPITAL_COMMUNITY)
Admission: RE | Disposition: A | Discharge status: Home / Self Care | Payer: Self-pay | Source: Ambulatory Visit | Attending: Obstetrics & Gynecology

## 2017-05-23 ENCOUNTER — Encounter (HOSPITAL_COMMUNITY): Payer: Self-pay | Admitting: *Deleted

## 2017-05-23 DIAGNOSIS — F17210 Nicotine dependence, cigarettes, uncomplicated: Secondary | ICD-10-CM | POA: Insufficient documentation

## 2017-05-23 DIAGNOSIS — R87613 High grade squamous intraepithelial lesion on cytologic smear of cervix (HGSIL): Secondary | ICD-10-CM

## 2017-05-23 DIAGNOSIS — F419 Anxiety disorder, unspecified: Secondary | ICD-10-CM | POA: Insufficient documentation

## 2017-05-23 DIAGNOSIS — Z79899 Other long term (current) drug therapy: Secondary | ICD-10-CM | POA: Insufficient documentation

## 2017-05-23 HISTORY — PX: CERVICAL ABLATION: SHX5771

## 2017-05-23 SURGERY — ABLATION, CERVIX
Anesthesia: General

## 2017-05-23 MED ORDER — CEFAZOLIN SODIUM-DEXTROSE 2-4 GM/100ML-% IV SOLN
2.0000 g | INTRAVENOUS | Status: AC
  Administered 2017-05-23: 2 g via INTRAVENOUS
  Filled 2017-05-23: qty 100

## 2017-05-23 MED ORDER — FENTANYL CITRATE (PF) 100 MCG/2ML IJ SOLN
INTRAMUSCULAR | Status: AC
  Filled 2017-05-23: qty 2

## 2017-05-23 MED ORDER — KETOROLAC TROMETHAMINE 30 MG/ML IJ SOLN
30.0000 mg | Freq: Once | INTRAMUSCULAR | Status: AC
  Administered 2017-05-23: 30 mg via INTRAVENOUS
  Filled 2017-05-23: qty 1

## 2017-05-23 MED ORDER — PROPOFOL 10 MG/ML IV BOLUS
INTRAVENOUS | Status: AC
  Filled 2017-05-23: qty 40

## 2017-05-23 MED ORDER — FERRIC SUBSULFATE 259 MG/GM EX SOLN
CUTANEOUS | Status: DC | PRN
  Administered 2017-05-23: 1

## 2017-05-23 MED ORDER — FENTANYL CITRATE (PF) 100 MCG/2ML IJ SOLN
25.0000 ug | INTRAMUSCULAR | Status: DC | PRN
  Administered 2017-05-23: 25 ug via INTRAVENOUS

## 2017-05-23 MED ORDER — LACTATED RINGERS IV SOLN
INTRAVENOUS | Status: DC
  Administered 2017-05-23: 10:00:00 via INTRAVENOUS

## 2017-05-23 MED ORDER — ONDANSETRON HCL 4 MG/2ML IJ SOLN
4.0000 mg | Freq: Once | INTRAMUSCULAR | Status: AC
  Administered 2017-05-23: 4 mg via INTRAVENOUS

## 2017-05-23 MED ORDER — LIDOCAINE HCL (PF) 1 % IJ SOLN
INTRAMUSCULAR | Status: AC
  Filled 2017-05-23: qty 15

## 2017-05-23 MED ORDER — OXYCODONE HCL 5 MG PO TABS: 5.0000 mg | ORAL_TABLET | ORAL | 0 refills | Status: DC | PRN

## 2017-05-23 MED ORDER — ONDANSETRON 8 MG PO TBDP: 8.0000 mg | ORAL_TABLET | Freq: Three times a day (TID) | ORAL | 0 refills | Status: DC | PRN

## 2017-05-23 MED ORDER — FERRIC SUBSULFATE 259 MG/GM EX SOLN
CUTANEOUS | Status: AC
  Filled 2017-05-23: qty 8

## 2017-05-23 MED ORDER — MIDAZOLAM HCL 2 MG/2ML IJ SOLN
INTRAMUSCULAR | Status: AC
  Filled 2017-05-23: qty 2

## 2017-05-23 MED ORDER — LIDOCAINE HCL (CARDIAC) 20 MG/ML IV SOLN
INTRAVENOUS | Status: DC | PRN
  Administered 2017-05-23: 40 mg via INTRAVENOUS

## 2017-05-23 MED ORDER — ONDANSETRON HCL 4 MG/2ML IJ SOLN
INTRAMUSCULAR | Status: AC
  Filled 2017-05-23: qty 2

## 2017-05-23 MED ORDER — KETOROLAC TROMETHAMINE 10 MG PO TABS: 10.0000 mg | ORAL_TABLET | Freq: Three times a day (TID) | ORAL | 0 refills | Status: DC | PRN

## 2017-05-23 MED ORDER — FENTANYL CITRATE (PF) 100 MCG/2ML IJ SOLN
25.0000 ug | Freq: Once | INTRAMUSCULAR | Status: AC
  Administered 2017-05-23: 25 ug via INTRAVENOUS

## 2017-05-23 MED ORDER — PROPOFOL 10 MG/ML IV BOLUS
INTRAVENOUS | Status: DC | PRN
  Administered 2017-05-23 (×2): 50 mg via INTRAVENOUS
  Administered 2017-05-23: 150 mg via INTRAVENOUS

## 2017-05-23 MED ORDER — MIDAZOLAM HCL 2 MG/2ML IJ SOLN
1.0000 mg | INTRAMUSCULAR | Status: AC
  Administered 2017-05-23: 2 mg via INTRAVENOUS

## 2017-05-23 MED ORDER — FENTANYL CITRATE (PF) 100 MCG/2ML IJ SOLN
INTRAMUSCULAR | Status: DC | PRN
  Administered 2017-05-23: 50 ug via INTRAVENOUS
  Administered 2017-05-23: 25 ug via INTRAVENOUS

## 2017-05-23 SURGICAL SUPPLY — 24 items
APL FBRTP 16 NS LF PRCTSCP (MISCELLANEOUS) ×2
APPLICATOR COTTON TIP 6IN STRL (MISCELLANEOUS) ×3 IMPLANT
BAG HAMPER (MISCELLANEOUS) ×3 IMPLANT
CLOTH BEACON ORANGE TIMEOUT ST (SAFETY) ×3 IMPLANT
COVER LIGHT HANDLE STERIS (MISCELLANEOUS) ×6 IMPLANT
COVER TABLE BACK 60X90 (DRAPES) ×3 IMPLANT
GAUZE SPONGE 4X4 16PLY XRAY LF (GAUZE/BANDAGES/DRESSINGS) ×3 IMPLANT
GLOVE BIOGEL PI IND STRL 7.0 (GLOVE) ×1 IMPLANT
GLOVE BIOGEL PI IND STRL 8 (GLOVE) ×1 IMPLANT
GLOVE BIOGEL PI INDICATOR 7.0 (GLOVE) ×2
GLOVE BIOGEL PI INDICATOR 8 (GLOVE) ×2
GLOVE ECLIPSE 8.0 STRL XLNG CF (GLOVE) ×3 IMPLANT
GOWN STRL REUS W/TWL LRG LVL3 (GOWN DISPOSABLE) ×3 IMPLANT
GOWN STRL REUS W/TWL XL LVL3 (GOWN DISPOSABLE) ×3 IMPLANT
KIT ROOM TURNOVER APOR (KITS) ×3 IMPLANT
LASER FIBER DISP 1000U (UROLOGICAL SUPPLIES) ×3 IMPLANT
MANIFOLD NEPTUNE II (INSTRUMENTS) ×3 IMPLANT
MARKER SKIN DUAL TIP RULER LAB (MISCELLANEOUS) ×3 IMPLANT
PAD ARMBOARD 7.5X6 YLW CONV (MISCELLANEOUS) ×3 IMPLANT
PREFILTER SMOKE EVAC (FILTER) ×3 IMPLANT
SET BASIN LINEN APH (SET/KITS/TRAYS/PACK) ×3 IMPLANT
SWAB PROCTOSCOPIC (MISCELLANEOUS) ×6 IMPLANT
TUBING SMOKE EVAC CO2 (TUBING) ×3 IMPLANT
WATER STERILE IRR 1000ML POUR (IV SOLUTION) ×6 IMPLANT

## 2017-05-23 NOTE — Discharge Instructions (Signed)
Cervical Laser Surgery, Care After °This sheet gives you information about how to care for yourself after your procedure. Your health care provider may also give you more specific instructions. If you have problems or questions, contact your health care provider. °What can I expect after the procedure? °After the procedure, it is common to have: °· Pain or discomfort. °· Mild cramping. °· Bleeding, spotting, or brownish discharge from your vagina. ° °Follow these instructions at home: °Activity °· Return to your normal activities as told by your health care provider. Ask your health care provider what activities are safe for you. °· Do not lift anything that is heavier than 10 lb (4.5 kg), or the limit that your health care provider tells you, until he or she says that it is safe. °· Do not have sex or put anything in your vagina until your health care provider says it is okay. °General instructions °· Take over-the-counter and prescription medicines only as told by your health care provider. °· Do not drive or use heavy machinery while taking prescription pain medicine. °· Wear sanitary pads to protect from bleeding, spotting, and discharge. °· Do not use tampons or douche until your health care provider says it is okay. °· It is up to you to get the results of your procedure. Ask your health care provider, or the department that is doing the procedure, when your results will be ready. °· Keep all follow-up visits as told by your health care provider. This is important. °Contact a health care provider if: °· Your pain or cramping does not improve. °· Your periods are more painful than usual. °· You do not get your period as expected. °Get help right away if: °· You have any symptoms of infection, such as: °? A fever. °? Chills. °? Discharge that smells bad. °· You have severe pain in your abdomen. °· You have heavy bleeding from your vagina (more than a normal period). °· You have vaginal bleeding with clumps of  blood (blood clots). °Summary °· After this procedure, it is common to have pain or discomfort and mild cramping. It is also common to have bleeding, spotting, or brownish discharge from your vagina. °· You may need to wear sanitary pads to protect from bleeding, spotting, and discharge. °· Do not have sex, use tampons, or douche until your health care provider says it is okay. °· Return to your normal activities as told by your health care provider. Ask your health care provider what activities are safe for you. °· Take over-the-counter and prescription medicines only as told by your health care provider. These include medicines for pain. °This information is not intended to replace advice given to you by your health care provider. Make sure you discuss any questions you have with your health care provider. °Document Released: 10/16/2016 Document Revised: 10/16/2016 Document Reviewed: 10/16/2016 °Elsevier Interactive Patient Education © 2018 Elsevier Inc. ° °

## 2017-05-23 NOTE — Anesthesia Preprocedure Evaluation (Signed)
Anesthesia Evaluation  Patient identified by MRN, date of birth, ID band Patient awake    Reviewed: Allergy & Precautions, NPO status , Patient's Chart, lab work & pertinent test results  Airway Mallampati: II  TM Distance: >3 FB     Dental  (+) Teeth Intact   Pulmonary asthma (inactive) , Current Smoker,    breath sounds clear to auscultation       Cardiovascular negative cardio ROS   Rhythm:Regular Rate:Normal     Neuro/Psych  Headaches, PSYCHIATRIC DISORDERS (ADHD) Anxiety    GI/Hepatic negative GI ROS,   Endo/Other    Renal/GU      Musculoskeletal   Abdominal   Peds  Hematology   Anesthesia Other Findings   Reproductive/Obstetrics                             Anesthesia Physical Anesthesia Plan  ASA: II  Anesthesia Plan: General   Post-op Pain Management:    Induction: Intravenous  PONV Risk Score and Plan:   Airway Management Planned: LMA  Additional Equipment:   Intra-op Plan:   Post-operative Plan: Extubation in OR  Informed Consent:   Plan Discussed with:   Anesthesia Plan Comments:         Anesthesia Quick Evaluation

## 2017-05-23 NOTE — Transfer of Care (Signed)
Immediate Anesthesia Transfer of Care Note  Patient: Andrea [name]  Procedure(s) Performed: Procedure(s): Laser Ablation of Cervix (N/A)  Patient Location: PACU  Anesthesia Type:General  Level of Consciousness: awake, sedated and patient cooperative  Airway & Oxygen Therapy: Patient Spontanous Breathing  Post-op Assessment: Report given to RN and Post -op Vital signs reviewed and stable  Post vital signs: Reviewed and stable  Last Vitals:  Vitals:   05/23/17 1100 05/23/17 1200  BP: 109/68 102/60  Pulse:    Resp: 12   Temp:      Last Pain:  Vitals:   05/23/17 0922  TempSrc: Oral         Complications: No apparent anesthesia complications

## 2017-05-23 NOTE — Anesthesia Postprocedure Evaluation (Signed)
Anesthesia Post Note  Patient: Andrea [name]  Procedure(s) Performed: Procedure(s) (LRB): Laser Ablation of Cervix (N/[name])  Patient location during evaluation: PACU Anesthesia Type: General Level of consciousness: awake and alert and oriented Pain management: pain level controlled Vital Signs Assessment: post-procedure vital signs reviewed and stable Respiratory status: spontaneous breathing Cardiovascular status: stable Postop Assessment: no signs of nausea or vomiting Anesthetic complications: no     Last Vitals:  Vitals:   05/23/17 1300 05/23/17 1301  BP: 121/75 121/75  Pulse: 96 96  Resp:  10  Temp:  36.6 C    Last Pain:  Vitals:   05/23/17 1337  TempSrc:   PainSc: 2                  Andrea [name]

## 2017-05-23 NOTE — Op Note (Signed)
Preoperative Diagnosis:  High Grade Squamous Intraepithelial lesion, adequate colposcopy with large lesion  Postoperative Diagnosis:  Same as above  Procedure:  Laser ablation of the cervix  Surgeon:  Lazaro ArmsLuther H Eure MD  Anaesthesia:  Laryngeal Mask Airway  Findings:  Patient had an abnormal pap smear which was evaluated in the office with colposcopy and directed biopsies.  Pathology report returned as high Grade SIL. The lesion was quite large and expansive. The colposcopy was adequate.  At her age if I did a conization it would destroy too much cervix.  As a result, the patient is admitted for laser ablation of the cervix.  Description of Note:  Patient was taken to the OR and placed in the supine position where she underwent laryngeal mask airway anaesthesia.  She was placed in the dorsal lithotomy position.  She was draped for laser.  Graves speculum was placed and 3% acetic acid used and the laser microscope employed to perform colposcopy which confirmed the office findings.  Laser was used on typical cervical settings and used to vaporized the squamocolumnar junction to  depth of  5-7 mm peripherally and 7-9 mm centrally.  Surgical margin of several mm was employed beyond the acetowhite epithelium.  Hemostasis was achieved with the laser and Monsel's solution.  Patient was awakened from anaesthesia in good stable condition and all counts were correct.  She received Ancef 1 gram and Toradol 30 mg IV preoperatively prophylactically.  Lazaro ArmsEURE,LUTHER H, MD 05/23/2017 12:56 PM

## 2017-05-23 NOTE — H&P (Signed)
Preoperative History and Physical  Andrea [name] is a 21 y.o. G0P0000 with No LMP recorded. Patient has had an injection. admitted for a colposcopically directed biopsies reveal HSIL, colposcopy revealed a large expansive cervical lesion which requires laser ablation of the cervical lesion.    PMH:        Past Medical History:  Diagnosis Date  . ADHD (attention deficit hyperactivity disorder)   . Anxiety   . Asthma   . Kidney stones   . Migraines   . Ovarian cyst   . Parent-child relational problem     PSH:     Past Surgical History:  Procedure Laterality Date  . COLPOSCOPY    . EXTRACORPOREAL SHOCK WAVE LITHOTRIPSY    . WISDOM TOOTH EXTRACTION      POb/GynH:              OB History    Gravida Para Term Preterm AB Living   0 0 0 0 0 0   SAB TAB Ectopic Multiple Live Births   0 0 0 0 0      SH:        Social History  Substance Use Topics  . Smoking status: Current Every Day Smoker    Packs/day: 0.50    Years: 3.00    Types: Cigarettes  . Smokeless tobacco: Never Used  . Alcohol use No    FH:         Family History  Problem Relation Age of Onset  . Heart disease Other   . Birth defects Other   . Cancer Other   . Diabetes Other      Allergies:      Allergies  Allergen Reactions  . Tylenol [Acetaminophen] Hives    Medications:       Current Outpatient Prescriptions:  .  ALPRAZolam (XANAX) 1 MG tablet, Take 1 mg by mouth 3 (three) times daily. , Disp: , Rfl: 2 .  baclofen (LIORESAL) 10 MG tablet, TAKE 1 TABLET THREE TIMES A DAY AS NEEDED FOR MUSCLE SPASM, Disp: , Rfl: 0 .  ibuprofen (ADVIL,MOTRIN) 800 MG tablet, Take 1 tablet (800 mg total) by mouth 3 (three) times daily. Please take with food (Patient taking differently: Take 800 mg by mouth as needed. Please take with food), Disp: 21 tablet, Rfl: 0 .  MedroxyPROGESTERone Acetate 150 MG/ML SUSY, INJECT 1 SYRINGE INTRAMUSCULARLY EVERY 3 MONTHS IN  OFFICE., Disp: 1 mL, Rfl: 0 .  venlafaxine (EFFEXOR) 75 MG tablet, Take 75 mg by mouth 2 (two) times daily., Disp: , Rfl: 3  Review of Systems:   Review of Systems  Constitutional: Negative for fever, chills, weight loss, malaise/fatigue and diaphoresis.  HENT: Negative for hearing loss, ear pain, nosebleeds, congestion, sore throat, neck pain, tinnitus and ear discharge.   Eyes: Negative for blurred vision, double vision, photophobia, pain, discharge and redness.  Respiratory: Negative for cough, hemoptysis, sputum production, shortness of breath, wheezing and stridor.   Cardiovascular: Negative for chest pain, palpitations, orthopnea, claudication, leg swelling and PND.  Gastrointestinal: Positive for abdominal pain. Negative for heartburn, nausea, vomiting, diarrhea, constipation, blood in stool and melena.  Genitourinary: Negative for dysuria, urgency, frequency, hematuria and flank pain.  Musculoskeletal: Negative for myalgias, back pain, joint pain and falls.  Skin: Negative for itching and rash.  Neurological: Negative for dizziness, tingling, tremors, sensory change, speech change, focal weakness, seizures, loss of consciousness, weakness and headaches.  Endo/Heme/Allergies: Negative for environmental allergies and polydipsia. Does not bruise/bleed easily.  Psychiatric/Behavioral: Negative for depression, suicidal ideas, hallucinations, memory loss and substance abuse. The patient is not nervous/anxious and does not have insomnia.      PHYSICAL EXAM:  Blood pressure (!) 110/58, pulse 75, height 4\' 9"  (1.448 m), weight 131 lb (59.4 kg).    Vitals reviewed. Constitutional: She is oriented to person, place, and time. She appears well-developed and well-nourished.  HENT:  Head: Normocephalic and atraumatic.  Right Ear: External ear normal.  Left Ear: External ear normal.  Nose: Nose normal.  Mouth/Throat: Oropharynx is clear and moist.  Eyes: Conjunctivae and EOM are  normal. Pupils are equal, round, and reactive to light. Right eye exhibits no discharge. Left eye exhibits no discharge. No scleral icterus.  Neck: Normal range of motion. Neck supple. No tracheal deviation present. No thyromegaly present.  Cardiovascular: Normal rate, regular rhythm, normal heart sounds and intact distal pulses.  Exam reveals no gallop and no friction rub.   No murmur heard. Respiratory: Effort normal and breath sounds normal. No respiratory distress. She has no wheezes. She has no rales. She exhibits no tenderness.  GI: Soft. Bowel sounds are normal. She exhibits no distension and no mass. There is tenderness. There is no rebound and no guarding.  Genitourinary:       Vulva is normal without lesions Vagina is pink moist without discharge Cervix normal in appearance grossly , see colpo note Uterus is normal size, contour, position, consistency, mobility, non-tender Adnexa is negative with normal sized ovaries by sonogram  Musculoskeletal: Normal range of motion. She exhibits no edema and no tenderness.  Neurological: She is alert and oriented to person, place, and time. She has normal reflexes. She displays normal reflexes. No cranial nerve deficit. She exhibits normal muscle tone. Coordination normal.  Skin: Skin is warm and dry. No rash noted. No erythema. No pallor.  Psychiatric: She has a normal mood and affect. Her behavior is normal. Judgment and thought content normal.    Labs: Results for orders placed or performed during the hospital encounter of 05/17/17 (from the past 168 hour(s))  TSH   Collection Time: 05/17/17  2:17 PM  Result Value Ref Range   TSH 1.170 0.350 - 4.500 uIU/mL  CBC   Collection Time: 05/17/17  2:17 PM  Result Value Ref Range   WBC 7.7 4.0 - 10.5 K/uL   RBC 4.52 3.87 - 5.11 MIL/uL   Hemoglobin 14.0 12.0 - 15.0 g/dL   HCT 16.1 09.6 - 04.5 %   MCV 90.5 78.0 - 100.0 fL   MCH 31.0 26.0 - 34.0 pg   MCHC 34.2 30.0 - 36.0 g/dL   RDW 40.9 81.1  - 91.4 %   Platelets 295 150 - 400 K/uL  Comprehensive metabolic panel   Collection Time: 05/17/17  2:17 PM  Result Value Ref Range   Sodium 137 135 - 145 mmol/L   Potassium 3.4 (L) 3.5 - 5.1 mmol/L   Chloride 106 101 - 111 mmol/L   CO2 23 22 - 32 mmol/L   Glucose, Bld 71 65 - 99 mg/dL   BUN 13 6 - 20 mg/dL   Creatinine, Ser 7.82 0.44 - 1.00 mg/dL   Calcium 9.0 8.9 - 95.6 mg/dL   Total Protein 7.5 6.5 - 8.1 g/dL   Albumin 4.2 3.5 - 5.0 g/dL   AST 23 15 - 41 U/L   ALT 22 14 - 54 U/L   Alkaline Phosphatase 73 38 - 126 U/L   Total Bilirubin 0.4 0.3 - 1.2  mg/dL   GFR calc non Af Amer >60 >60 mL/min   GFR calc Af Amer >60 >60 mL/min   Anion gap 8 5 - 15  hCG, quantitative, pregnancy   Collection Time: 05/17/17  2:17 PM  Result Value Ref Range   hCG, Beta Chain, Quant, S <1 <5 mIU/mL  Rapid HIV screen (HIV 1/2 Ab+Ag)   Collection Time: 05/17/17  2:17 PM  Result Value Ref Range   HIV-1 P24 Antigen - HIV24 NON REACTIVE NON REACTIVE   HIV 1/2 Antibodies NON REACTIVE NON REACTIVE   Interpretation (HIV Ag Ab)      A non reactive test result means that HIV 1 or HIV 2 antibodies and HIV 1 p24 antigen were not detected in the specimen.  Urinalysis, Routine w reflex microscopic   Collection Time: 05/17/17  2:17 PM  Result Value Ref Range   Color, Urine YELLOW YELLOW   APPearance CLOUDY (A) CLEAR   Specific Gravity, Urine 1.017 1.005 - 1.030   pH 5.0 5.0 - 8.0   Glucose, UA NEGATIVE NEGATIVE mg/dL   Hgb urine dipstick LARGE (A) NEGATIVE   Bilirubin Urine NEGATIVE NEGATIVE   Ketones, ur NEGATIVE NEGATIVE mg/dL   Protein, ur NEGATIVE NEGATIVE mg/dL   Nitrite NEGATIVE NEGATIVE   Leukocytes, UA LARGE (A) NEGATIVE   RBC / HPF TOO NUMEROUS TO COUNT 0 - 5 RBC/hpf   WBC, UA 6-30 0 - 5 WBC/hpf   Bacteria, UA RARE (A) NONE SEEN   Squamous Epithelial / LPF 6-30 (A) NONE SEEN   Mucous PRESENT   Type and screen   Collection Time: 05/17/17  2:17 PM  Result Value Ref Range   ABO/RH(D) A POS     Antibody Screen NEG    Sample Expiration 05/31/2017   Surgical pcr screen   Collection Time: 05/17/17  2:21 PM  Result Value Ref Range   MRSA, PCR NEGATIVE NEGATIVE   Staphylococcus aureus NEGATIVE NEGATIVE         Results for orders placed or performed in visit on 05/04/17 (from the past 336 hour(s))  POCT urine pregnancy   Collection Time: 05/04/17 11:26 AM  Result Value Ref Range   Preg Test, Ur Negative Negative  Results for orders placed or performed in visit on 04/24/17 (from the past 336 hour(s))  POCT urine pregnancy   Collection Time: 04/24/17  9:40 AM  Result Value Ref Range   Preg Test, Ur Negative Negative    EKG: No orders found for this or any previous visit.  Imaging Studies: ImagingResults  No results found.      Assessment: High grade squamous intraepithelial cervical dysplasia - large expansive lesion will require laser ablation, laser conization would be too destructive to the cervix  Pregnancy examination or test, negative result - Plan: POCT urine pregnancy       Patient Active Problem List   Diagnosis Date Noted  . Abnormal Pap smear of cervix 03/12/2017  . Constipation 02/28/2017    Plan: Laser conization of the cervix due to high grade dysplasia with a very large lesion: 05/23/2017  Willette Mudry H 05/23/2017 11:37 AM

## 2017-05-23 NOTE — Anesthesia Procedure Notes (Signed)
Procedure Name: LMA Insertion Date/Time: 05/23/2017 12:14 PM Performed by: Pernell DupreADAMS, AMY A Pre-anesthesia Checklist: Patient identified, Timeout performed, Emergency Drugs available, Suction available and Patient being monitored Patient Re-evaluated:Patient Re-evaluated prior to inductionOxygen Delivery Method: Circle system utilized Preoxygenation: Pre-oxygenation with 100% oxygen Intubation Type: IV induction LMA: LMA inserted LMA Size: 3.0 Number of attempts: 1 Tube secured with: Tape Dental Injury: Teeth and Oropharynx as per pre-operative assessment

## 2017-05-24 ENCOUNTER — Ambulatory Visit: Payer: Medicaid Other

## 2017-05-24 ENCOUNTER — Encounter (HOSPITAL_COMMUNITY): Payer: Self-pay | Admitting: Obstetrics & Gynecology

## 2017-05-25 LAB — TYPE AND SCREEN
ABO/RH(D): A POS
Antibody Screen: NEGATIVE

## 2017-05-28 ENCOUNTER — Telehealth: Payer: Self-pay | Admitting: Obstetrics & Gynecology

## 2017-05-28 ENCOUNTER — Other Ambulatory Visit: Payer: Self-pay | Admitting: Obstetrics & Gynecology

## 2017-05-28 ENCOUNTER — Telehealth: Payer: Self-pay | Admitting: *Deleted

## 2017-05-28 MED ORDER — PROMETHAZINE HCL 25 MG PO TABS: 25.0000 mg | ORAL_TABLET | Freq: Four times a day (QID) | ORAL | 1 refills | Status: DC | PRN

## 2017-05-28 MED ORDER — OXYCODONE HCL 5 MG PO TABS: 5.0000 mg | ORAL_TABLET | ORAL | 0 refills | Status: DC | PRN

## 2017-05-28 MED ORDER — ONDANSETRON 8 MG PO TBDP: 8.0000 mg | ORAL_TABLET | Freq: Three times a day (TID) | ORAL | 0 refills | Status: DC | PRN

## 2017-05-28 NOTE — Telephone Encounter (Signed)
Phenergan was e prescribed

## 2017-05-28 NOTE — Telephone Encounter (Deleted)
Patient had surgery on Wednesday and is still in a lot of pain. Patient would like a refill of pain medication and naussea medication. Patient is also having a black discharge.

## 2017-05-28 NOTE — Telephone Encounter (Signed)
She will have to come and pick up the oxycodone, the zofran was e prescribed

## 2017-05-28 NOTE — Telephone Encounter (Signed)
Informed patient nausea and pain medications prescriptions were refilled but she will need to come pick up. Verbalized understaning. Will pick up soon.

## 2017-05-28 NOTE — Telephone Encounter (Signed)
Patient had surgery on Wednesday and is still in a lot of pain. Patient is having a black discharge and would like a refill of pain medication and naussea medication.

## 2017-05-28 NOTE — Telephone Encounter (Signed)
Informed patient phenergan was e prescribed and advised it will make her drowsy so not to drive after she takes it. Verbalized understanding.

## 2017-05-28 NOTE — Telephone Encounter (Signed)
Patient states Medicaid will not cover Zofran. Any thing else she can try? Please advise.

## 2017-05-28 NOTE — Telephone Encounter (Signed)
Patient states she is still having some dark bleeding, nausea and pain. She is requesting more pain and nausea medicine. Please advise.

## 2017-05-31 ENCOUNTER — Ambulatory Visit (INDEPENDENT_AMBULATORY_CARE_PROVIDER_SITE_OTHER): Payer: Self-pay | Admitting: Obstetrics & Gynecology

## 2017-05-31 ENCOUNTER — Other Ambulatory Visit: Payer: Self-pay | Admitting: Adult Health

## 2017-05-31 ENCOUNTER — Encounter: Payer: Self-pay | Admitting: Obstetrics & Gynecology

## 2017-05-31 VITALS — BP 100/70 | HR 88 | Wt 136.0 lb

## 2017-05-31 DIAGNOSIS — R87613 High grade squamous intraepithelial lesion on cytologic smear of cervix (HGSIL): Secondary | ICD-10-CM

## 2017-05-31 DIAGNOSIS — Z9889 Other specified postprocedural states: Secondary | ICD-10-CM

## 2017-05-31 NOTE — Progress Notes (Signed)
  HPI: Patient returns for routine postoperative follow-up having undergone laser ablation of the cervix on 05/23/2017.  The patient's immediate postoperative recovery has been unremarkable. Since hospital discharge the patient reports cramping and foul-smelling discharge both of which are normal.   Current Outpatient Prescriptions: ALPRAZolam (XANAX) 1 MG tablet, Take 1 mg by mouth 3 (three) times daily. , Disp: , Rfl: 2 baclofen (LIORESAL) 10 MG tablet, TAKE 1 TABLET TWICE DAILY AS NEEDED FOR MUSCLE SPASMS, Disp: , Rfl: 0 citalopram (CELEXA) 20 MG tablet, Take 20 mg by mouth daily., Disp: , Rfl:  ibuprofen (ADVIL,MOTRIN) 800 MG tablet, Take 1 tablet (800 mg total) by mouth 3 (three) times daily. Please take with food (Patient taking differently: Take 800 mg by mouth 2 (two) times daily as needed for moderate pain. Please take with food), Disp: 21 tablet, Rfl: 0 MedroxyPROGESTERone Acetate 150 MG/ML SUSY, INJECT 1 SYRINGE INTRAMUSCULARLY EVERY 3 MONTHS IN OFFICE., Disp: 1 mL, Rfl: 0 ondansetron (ZOFRAN ODT) 8 MG disintegrating tablet, Take 1 tablet (8 mg total) by mouth every 8 (eight) hours as needed for nausea or vomiting., Disp: 20 tablet, Rfl: 0 oxyCODONE (OXY IR/ROXICODONE) 5 MG immediate release tablet, Take 1 tablet (5 mg total) by mouth every 4 (four) hours as needed for severe pain., Disp: 20 tablet, Rfl: 0 promethazine (PHENERGAN) 25 MG tablet, Take 1 tablet (25 mg total) by mouth every 6 (six) hours as needed for nausea or vomiting., Disp: 15 tablet, Rfl: 1  No current facility-administered medications for this visit.     Blood pressure 100/70, pulse 88, weight 136 lb (61.7 kg).  Physical Exam: Abdomen soft Speculum exam reveals a dry cervical ablation bed no active bleeding Nonetheless I put more Monsel's and the laser bed of the cervix  Diagnostic Tests:   Pathology: None  Impression: Status post laser ablation of the cervix due to high-grade dysplasia  Plan: Repeat  Pap smear every 6 months for the next 2 years Patient is reminded to not have any intercourse of proceeding thing in her vagina for at least the next 5 weeks for her final postop visit  Follow up: 5  weeks for final postop visit  Lazaro ArmsEURE,LUTHER H, MD

## 2017-06-01 ENCOUNTER — Encounter: Payer: Self-pay | Admitting: *Deleted

## 2017-06-01 ENCOUNTER — Ambulatory Visit (INDEPENDENT_AMBULATORY_CARE_PROVIDER_SITE_OTHER): Payer: Medicaid Other | Admitting: *Deleted

## 2017-06-01 DIAGNOSIS — Z3042 Encounter for surveillance of injectable contraceptive: Secondary | ICD-10-CM

## 2017-06-01 DIAGNOSIS — Z3049 Encounter for surveillance of other contraceptives: Secondary | ICD-10-CM | POA: Diagnosis not present

## 2017-06-01 MED ORDER — MEDROXYPROGESTERONE ACETATE 150 MG/ML IM SUSP
150.0000 mg | Freq: Once | INTRAMUSCULAR | Status: AC
  Administered 2017-06-01: 150 mg via INTRAMUSCULAR

## 2017-06-01 NOTE — Progress Notes (Signed)
Pt here for Depo. No need for urine pregnancy test per Dr. Despina HiddenEure. Pt tolerated shot well. Return in 12 weeks for next shot. JSY

## 2017-07-05 ENCOUNTER — Encounter: Payer: Medicaid Other | Admitting: Obstetrics & Gynecology

## 2017-07-17 ENCOUNTER — Encounter: Payer: Medicaid Other | Admitting: Obstetrics & Gynecology

## 2017-08-24 ENCOUNTER — Ambulatory Visit: Payer: Medicaid Other

## 2017-08-24 ENCOUNTER — Encounter: Payer: Self-pay | Admitting: *Deleted

## 2018-04-27 ENCOUNTER — Emergency Department (HOSPITAL_COMMUNITY)
Admission: EM | Admit: 2018-04-27 | Discharge: 2018-04-27 | Disposition: A | Discharge status: Home / Self Care | Payer: No Typology Code available for payment source | Attending: Emergency Medicine | Admitting: Emergency Medicine

## 2018-04-27 ENCOUNTER — Emergency Department (HOSPITAL_COMMUNITY): Payer: No Typology Code available for payment source

## 2018-04-27 ENCOUNTER — Encounter (HOSPITAL_COMMUNITY): Payer: Self-pay

## 2018-04-27 DIAGNOSIS — Y939 Activity, unspecified: Secondary | ICD-10-CM | POA: Diagnosis not present

## 2018-04-27 DIAGNOSIS — Y92410 Unspecified street and highway as the place of occurrence of the external cause: Secondary | ICD-10-CM | POA: Insufficient documentation

## 2018-04-27 DIAGNOSIS — R55 Syncope and collapse: Secondary | ICD-10-CM | POA: Diagnosis present

## 2018-04-27 DIAGNOSIS — Z79899 Other long term (current) drug therapy: Secondary | ICD-10-CM | POA: Insufficient documentation

## 2018-04-27 DIAGNOSIS — J45909 Unspecified asthma, uncomplicated: Secondary | ICD-10-CM | POA: Diagnosis not present

## 2018-04-27 DIAGNOSIS — Y998 Other external cause status: Secondary | ICD-10-CM | POA: Diagnosis not present

## 2018-04-27 DIAGNOSIS — F17210 Nicotine dependence, cigarettes, uncomplicated: Secondary | ICD-10-CM | POA: Insufficient documentation

## 2018-04-27 LAB — URINALYSIS, ROUTINE W REFLEX MICROSCOPIC
BACTERIA UA: NONE SEEN
BILIRUBIN URINE: NEGATIVE
Glucose, UA: NEGATIVE mg/dL
KETONES UR: 5 mg/dL — AB
Leukocytes, UA: NEGATIVE
Nitrite: NEGATIVE
Protein, ur: NEGATIVE mg/dL
SPECIFIC GRAVITY, URINE: 1.018 (ref 1.005–1.030)
pH: 6 (ref 5.0–8.0)

## 2018-04-27 LAB — CBC WITH DIFFERENTIAL/PLATELET
Basophils Absolute: 0 10*3/uL (ref 0.0–0.1)
Basophils Relative: 0 %
EOS PCT: 2 %
Eosinophils Absolute: 0.2 10*3/uL (ref 0.0–0.7)
HEMATOCRIT: 39 % (ref 36.0–46.0)
Hemoglobin: 13 g/dL (ref 12.0–15.0)
LYMPHS ABS: 2 10*3/uL (ref 0.7–4.0)
LYMPHS PCT: 15 %
MCH: 30.5 pg (ref 26.0–34.0)
MCHC: 33.3 g/dL (ref 30.0–36.0)
MCV: 91.5 fL (ref 78.0–100.0)
MONO ABS: 0.8 10*3/uL (ref 0.1–1.0)
MONOS PCT: 6 %
NEUTROS ABS: 10.5 10*3/uL — AB (ref 1.7–7.7)
Neutrophils Relative %: 77 %
Platelets: 226 10*3/uL (ref 150–400)
RBC: 4.26 MIL/uL (ref 3.87–5.11)
RDW: 12.8 % (ref 11.5–15.5)
WBC: 13.6 10*3/uL — ABNORMAL HIGH (ref 4.0–10.5)

## 2018-04-27 LAB — BASIC METABOLIC PANEL
Anion gap: 7 (ref 5–15)
BUN: 12 mg/dL (ref 6–20)
CALCIUM: 8.4 mg/dL — AB (ref 8.9–10.3)
CO2: 24 mmol/L (ref 22–32)
Chloride: 107 mmol/L (ref 101–111)
Creatinine, Ser: 0.6 mg/dL (ref 0.44–1.00)
GFR calc Af Amer: >60 mL/min (ref >60)
GFR calc non Af Amer: >60 mL/min (ref >60)
GLUCOSE: 100 mg/dL — AB (ref 65–99)
Potassium: 3.5 mmol/L (ref 3.5–5.1)
Sodium: 138 mmol/L (ref 135–145)

## 2018-04-27 LAB — RAPID URINE DRUG SCREEN, HOSP PERFORMED
Amphetamines: NOT DETECTED
Barbiturates: NOT DETECTED
Benzodiazepines: POSITIVE — AB
COCAINE: POSITIVE — AB
OPIATES: NOT DETECTED
Tetrahydrocannabinol: POSITIVE — AB

## 2018-04-27 LAB — HCG, SERUM, QUALITATIVE: PREG SERUM: NEGATIVE

## 2018-04-27 MED ORDER — SODIUM CHLORIDE 0.9 % IV BOLUS
1000.0000 mL | Freq: Once | INTRAVENOUS | Status: AC
  Administered 2018-04-27: 1000 mL via INTRAVENOUS
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
## 2018-04-27 NOTE — ED Notes (Signed)
Called to pt's room, states she was going to leave. Advised that transport was on the way to pick her up for radiology, pt states she would have xrays done but is leaving 30 minutes afterward.

## 2018-10-04 ENCOUNTER — Ambulatory Visit (INDEPENDENT_AMBULATORY_CARE_PROVIDER_SITE_OTHER): Payer: Medicaid Other | Admitting: Adult Health

## 2018-10-04 ENCOUNTER — Encounter: Payer: Self-pay | Admitting: Adult Health

## 2018-10-04 VITALS — BP 131/83 | HR 108 | Ht 59.0 in | Wt 150.0 lb

## 2018-10-04 DIAGNOSIS — N926 Irregular menstruation, unspecified: Secondary | ICD-10-CM

## 2018-10-04 DIAGNOSIS — O219 Vomiting of pregnancy, unspecified: Secondary | ICD-10-CM | POA: Insufficient documentation

## 2018-10-04 DIAGNOSIS — R112 Nausea with vomiting, unspecified: Secondary | ICD-10-CM

## 2018-10-04 DIAGNOSIS — O3680X0 Pregnancy with inconclusive fetal viability, not applicable or unspecified: Secondary | ICD-10-CM

## 2018-10-04 DIAGNOSIS — Z3A01 Less than 8 weeks gestation of pregnancy: Secondary | ICD-10-CM | POA: Insufficient documentation

## 2018-10-04 DIAGNOSIS — Z3201 Encounter for pregnancy test, result positive: Secondary | ICD-10-CM

## 2018-10-04 LAB — POCT URINE PREGNANCY: Preg Test, Ur: POSITIVE — AB

## 2018-10-04 MED ORDER — PROMETHAZINE HCL 25 MG PO TABS: 25.0000 mg | ORAL_TABLET | Freq: Four times a day (QID) | ORAL | 0 refills | Status: DC | PRN

## 2018-10-04 MED ORDER — PRENATAL PLUS 27-1 MG PO TABS
1.0000 | ORAL_TABLET | Freq: Every day | ORAL | 12 refills | Status: DC
Start: 2018-10-04 — End: 2019-07-14

## 2018-10-04 NOTE — Patient Instructions (Signed)
First Trimester of Pregnancy The first trimester of pregnancy is from week 1 until the end of week 13 (months 1 through 3). A week after a sperm fertilizes an egg, the egg will implant on the wall of the uterus. This embryo will begin to develop into a baby. Genes from you and your partner will form the baby. The female genes will determine whether the baby will be a boy or a girl. At 6-8 weeks, the eyes and face will be formed, and the heartbeat can be seen on ultrasound. At the end of 12 weeks, all the baby's organs will be formed. Now that you are pregnant, you will want to do everything you can to have a healthy baby. Two of the most important things are to get good prenatal care and to follow your health care provider's instructions. Prenatal care is all the medical care you receive before the baby's birth. This care will help prevent, find, and treat any problems during the pregnancy and childbirth. Body changes during your first trimester Your body goes through many changes during pregnancy. The changes vary from woman to woman.  You may gain or lose a couple of pounds at first.  You may feel sick to your stomach (nauseous) and you may throw up (vomit). If the vomiting is uncontrollable, call your health care provider.  You may tire easily.  You may develop headaches that can be relieved by medicines. All medicines should be approved by your health care provider.  You may urinate more often. Painful urination may mean you have a bladder infection.  You may develop heartburn as a result of your pregnancy.  You may develop constipation because certain hormones are causing the muscles that push stool through your intestines to slow down.  You may develop hemorrhoids or swollen veins (varicose veins).  Your breasts may begin to grow larger and become tender. Your nipples may stick out more, and the tissue that surrounds them (areola) may become darker.  Your gums may bleed and may be  sensitive to brushing and flossing.  Dark spots or blotches (chloasma, mask of pregnancy) may develop on your face. This will likely fade after the baby is born.  Your menstrual periods will stop.  You may have a loss of appetite.  You may develop cravings for certain kinds of food.  You may have changes in your emotions from day to day, such as being excited to be pregnant or being concerned that something may go wrong with the pregnancy and baby.  You may have more vivid and strange dreams.  You may have changes in your hair. These can include thickening of your hair, rapid growth, and changes in texture. Some women also have hair loss during or after pregnancy, or hair that feels dry or thin. Your hair will most likely return to normal after your baby is born.  What to expect at prenatal visits During a routine prenatal visit:  You will be weighed to make sure you and the baby are growing normally.  Your blood pressure will be taken.  Your abdomen will be measured to track your baby's growth.  The fetal heartbeat will be listened to between weeks 10 and 14 of your pregnancy.  Test results from any previous visits will be discussed.  Your health care provider may ask you:  How you are feeling.  If you are feeling the baby move.  If you have had any abnormal symptoms, such as leaking fluid, bleeding, severe headaches,   or abdominal cramping.  If you are using any tobacco products, including cigarettes, chewing tobacco, and electronic cigarettes.  If you have any questions.  Other tests that may be performed during your first trimester include:  Blood tests to find your blood type and to check for the presence of any previous infections. The tests will also be used to check for low iron levels (anemia) and protein on red blood cells (Rh antibodies). Depending on your risk factors, or if you previously had diabetes during pregnancy, you may have tests to check for high blood  sugar that affects pregnant women (gestational diabetes).  Urine tests to check for infections, diabetes, or protein in the urine.  An ultrasound to confirm the proper growth and development of the baby.  Fetal screens for spinal cord problems (spina bifida) and Down syndrome.  HIV (human immunodeficiency virus) testing. Routine prenatal testing includes screening for HIV, unless you choose not to have this test.  You may need other tests to make sure you and the baby are doing well.  Follow these instructions at home: Medicines  Follow your health care provider's instructions regarding medicine use. Specific medicines may be either safe or unsafe to take during pregnancy.  Take a prenatal vitamin that contains at least 600 micrograms (mcg) of folic acid.  If you develop constipation, try taking a stool softener if your health care provider approves. Eating and drinking  Eat a balanced diet that includes fresh fruits and vegetables, whole grains, good sources of protein such as meat, eggs, or tofu, and low-fat dairy. Your health care provider will help you determine the amount of weight gain that is right for you.  Avoid raw meat and uncooked cheese. These carry germs that can cause birth defects in the baby.  Eating four or five small meals rather than three large meals a day may help relieve nausea and vomiting. If you start to feel nauseous, eating a few soda crackers can be helpful. Drinking liquids between meals, instead of during meals, also seems to help ease nausea and vomiting.  Limit foods that are high in fat and processed sugars, such as fried and sweet foods.  To prevent constipation: ? Eat foods that are high in fiber, such as fresh fruits and vegetables, whole grains, and beans. ? Drink enough fluid to keep your urine clear or pale yellow. Activity  Exercise only as directed by your health care provider. Most women can continue their usual exercise routine during  pregnancy. Try to exercise for 30 minutes at least 5 days a week. Exercising will help you: ? Control your weight. ? Stay in shape. ? Be prepared for labor and delivery.  Experiencing pain or cramping in the lower abdomen or lower back is a good sign that you should stop exercising. Check with your health care provider before continuing with normal exercises.  Try to avoid standing for long periods of time. Move your legs often if you must stand in one place for a long time.  Avoid heavy lifting.  Wear low-heeled shoes and practice good posture.  You may continue to have sex unless your health care provider tells you not to. Relieving pain and discomfort  Wear a good support bra to relieve breast tenderness.  Take warm sitz baths to soothe any pain or discomfort caused by hemorrhoids. Use hemorrhoid cream if your health care provider approves.  Rest with your legs elevated if you have leg cramps or low back pain.  If you develop   varicose veins in your legs, wear support hose. Elevate your feet for 15 minutes, 3-4 times a day. Limit salt in your diet. Prenatal care  Schedule your prenatal visits by the twelfth week of pregnancy. They are usually scheduled monthly at first, then more often in the last 2 months before delivery.  Write down your questions. Take them to your prenatal visits.  Keep all your prenatal visits as told by your health care provider. This is important. Safety  Wear your seat belt at all times when driving.  Make a list of emergency phone numbers, including numbers for family, friends, the hospital, and police and fire departments. General instructions  Ask your health care provider for a referral to a local prenatal education class. Begin classes no later than the beginning of month 6 of your pregnancy.  Ask for help if you have counseling or nutritional needs during pregnancy. Your health care provider can offer advice or refer you to specialists for help  with various needs.  Do not use hot tubs, steam rooms, or saunas.  Do not douche or use tampons or scented sanitary pads.  Do not cross your legs for long periods of time.  Avoid cat litter boxes and soil used by cats. These carry germs that can cause birth defects in the baby and possibly loss of the fetus by miscarriage or stillbirth.  Avoid all smoking, herbs, alcohol, and medicines not prescribed by your health care provider. Chemicals in these products affect the formation and growth of the baby.  Do not use any products that contain nicotine or tobacco, such as cigarettes and e-cigarettes. If you need help quitting, ask your health care provider. You may receive counseling support and other resources to help you quit.  Schedule a dentist appointment. At home, brush your teeth with a soft toothbrush and be gentle when you floss. Contact a health care provider if:  You have dizziness.  You have mild pelvic cramps, pelvic pressure, or nagging pain in the abdominal area.  You have persistent nausea, vomiting, or diarrhea.  You have a bad smelling vaginal discharge.  You have pain when you urinate.  You notice increased swelling in your face, hands, legs, or ankles.  You are exposed to fifth disease or chickenpox.  You are exposed to German measles (rubella) and have never had it. Get help right away if:  You have a fever.  You are leaking fluid from your vagina.  You have spotting or bleeding from your vagina.  You have severe abdominal cramping or pain.  You have rapid weight gain or loss.  You vomit blood or material that looks like coffee grounds.  You develop a severe headache.  You have shortness of breath.  You have any kind of trauma, such as from a fall or a car accident. Summary  The first trimester of pregnancy is from week 1 until the end of week 13 (months 1 through 3).  Your body goes through many changes during pregnancy. The changes vary from  woman to woman.  You will have routine prenatal visits. During those visits, your health care provider will examine you, discuss any test results you may have, and talk with you about how you are feeling. This information is not intended to replace advice given to you by your health care provider. Make sure you discuss any questions you have with your health care provider. Document Released: 11/21/2001 Document Revised: 11/08/2016 Document Reviewed: 11/08/2016 Elsevier Interactive Patient Education  2018 [employer]   Inc[employer]  

## 2018-10-04 NOTE — Progress Notes (Signed)
  Subjective:     Patient ID: Andrea [name], female   DOB: 14-Jun-1996, 22 y.o.   MRN: [id number]  HPI Andrea [name] is a 22 year old white female in for UPT, has missed a period and had 2+HPTs and has nausea and vomiting.  Review of Systems +missed period with 2+HPTs +nausea and vomiting Reviewed past medical,surgical, social and family history. Reviewed medications and allergies.     Objective:   Physical Exam BP 131/83 (BP Location: Left Arm, Patient Position: Sitting, Cuff Size: Normal)   Pulse (!) 108   Ht 4\' 11"  (1.499 m)   Wt 150 lb (68 kg)   LMP 08/16/2018 (Approximate)   BMI 30.30 kg/m UPT +about 7 weeks  By LMP with EDD 05/24/19.Skin warm and dry. Neck: mid line trachea, normal thyroid, good ROM, no lymphadenopathy noted. Lungs: clear to ausculation bilaterally. Cardiovascular: regular rate and rhythm.Abdomen is soft and non tender.    Assessment:     1. Pregnancy examination or test, positive result   2. Less than [redacted] weeks gestation of pregnancy   3. Encounter to determine fetal viability of pregnancy, single or unspecified fetus   4. Nausea and vomiting during pregnancy prior to [redacted] weeks gestation       Plan:     Meds ordered this encounter  Medications  . prenatal vitamin w/FE, FA (PRENATAL 1 + 1) 27-1 MG TABS tablet    Sig: Take 1 tablet by mouth daily at 12 noon.    Dispense:  30 each    Refill:  12    Order Specific Question:   Supervising Provider    Answer:   Despina Hidden, LUTHER H [[id number]]  . promethazine (PHENERGAN) 25 MG tablet    Sig: Take 1 tablet (25 mg total) by mouth every 6 (six) hours as needed for nausea or vomiting.    Dispense:  30 tablet    Refill:  0    Order Specific Question:   Supervising Provider    Answer:   Duane Lope H [[id number]]  Return in 1 week for dating [place] and 3 weeks for new OB Review handouts on First trimester and by [facility]

## 2018-10-14 ENCOUNTER — Ambulatory Visit (INDEPENDENT_AMBULATORY_CARE_PROVIDER_SITE_OTHER): Payer: Medicaid Other

## 2018-10-14 DIAGNOSIS — O3680X0 Pregnancy with inconclusive fetal viability, not applicable or unspecified: Secondary | ICD-10-CM

## 2018-10-14 NOTE — Progress Notes (Addendum)
[place] 10+5 wks,single IUP,positive FHT 176 bpm,normal ovaries bilat,crl 38.54 mm,subchorionic hemorrhage 5.5 x 3.6 x 1.7 cm

## 2018-10-17 ENCOUNTER — Encounter: Payer: Self-pay | Admitting: Adult Health

## 2018-10-17 ENCOUNTER — Telehealth: Payer: Self-pay | Admitting: Adult Health

## 2018-10-17 DIAGNOSIS — O208 Other hemorrhage in early pregnancy: Secondary | ICD-10-CM | POA: Insufficient documentation

## 2018-10-17 DIAGNOSIS — O468X1 Other antepartum hemorrhage, first trimester: Principal | ICD-10-CM

## 2018-10-17 DIAGNOSIS — O418X10 Other specified disorders of amniotic fluid and membranes, first trimester, not applicable or unspecified: Secondary | ICD-10-CM | POA: Insufficient documentation

## 2018-10-17 HISTORY — DX: Other specified disorders of amniotic fluid and membranes, first trimester, not applicable or unspecified: O41.8X10

## 2018-10-17 HISTORY — DX: Other hemorrhage in early pregnancy: O20.8

## 2018-10-17 NOTE — Telephone Encounter (Signed)
Pt aware US showed [facility] , so no sex for now, will recheck at 19 week [place]

## 2018-10-28 ENCOUNTER — Ambulatory Visit (INDEPENDENT_AMBULATORY_CARE_PROVIDER_SITE_OTHER): Payer: Medicaid Other | Admitting: Women's Health

## 2018-10-28 ENCOUNTER — Ambulatory Visit: Payer: Medicaid Other | Admitting: *Deleted

## 2018-10-28 ENCOUNTER — Other Ambulatory Visit (HOSPITAL_COMMUNITY)
Admission: RE | Admit: 2018-10-28 | Discharge: 2018-10-28 | Disposition: A | Discharge status: Home / Self Care | Payer: Medicaid Other | Source: Ambulatory Visit | Attending: Obstetrics & Gynecology | Admitting: Obstetrics & Gynecology

## 2018-10-28 ENCOUNTER — Encounter: Payer: Self-pay | Admitting: Women's Health

## 2018-10-28 VITALS — BP 126/82 | HR 99 | Wt 153.0 lb

## 2018-10-28 DIAGNOSIS — Z3A12 12 weeks gestation of pregnancy: Secondary | ICD-10-CM | POA: Diagnosis not present

## 2018-10-28 DIAGNOSIS — Z3682 Encounter for antenatal screening for nuchal translucency: Secondary | ICD-10-CM

## 2018-10-28 DIAGNOSIS — Z3401 Encounter for supervision of normal first pregnancy, first trimester: Secondary | ICD-10-CM

## 2018-10-28 DIAGNOSIS — Z3400 Encounter for supervision of normal first pregnancy, unspecified trimester: Secondary | ICD-10-CM | POA: Insufficient documentation

## 2018-10-28 DIAGNOSIS — Z1389 Encounter for screening for other disorder: Secondary | ICD-10-CM

## 2018-10-28 DIAGNOSIS — F418 Other specified anxiety disorders: Secondary | ICD-10-CM

## 2018-10-28 DIAGNOSIS — Z331 Pregnant state, incidental: Secondary | ICD-10-CM

## 2018-10-28 DIAGNOSIS — F17200 Nicotine dependence, unspecified, uncomplicated: Secondary | ICD-10-CM | POA: Insufficient documentation

## 2018-10-28 DIAGNOSIS — Z23 Encounter for immunization: Secondary | ICD-10-CM | POA: Diagnosis not present

## 2018-10-28 DIAGNOSIS — F1121 Opioid dependence, in remission: Secondary | ICD-10-CM

## 2018-10-28 DIAGNOSIS — Z79899 Other long term (current) drug therapy: Secondary | ICD-10-CM

## 2018-10-28 LAB — POCT URINALYSIS DIPSTICK OB
Blood, UA: NEGATIVE
Glucose, UA: NEGATIVE
Ketones, UA: NEGATIVE
Leukocytes, UA: NEGATIVE
NITRITE UA: NEGATIVE
PROTEIN: NEGATIVE

## 2018-10-28 MED ORDER — ALBUTEROL SULFATE HFA 108 (90 BASE) MCG/ACT IN AERS: 2.0000 | INHALATION_SPRAY | Freq: Four times a day (QID) | RESPIRATORY_TRACT | 2 refills | Status: DC | PRN
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
## 2018-10-28 NOTE — Patient Instructions (Addendum)
Andrea [name], I greatly value your feedback.  If you receive a survey following your visit with [place] today, we appreciate you taking the time to fill it out.  Thanks, Joellyn Haff, CNM, [facility]  [facility]  . [employer]   [address] [place], [phone]  . [facility], [facility]. Cathey Endow  [address] [place] (beside [address]) [phone]  . [facility]  [address] [place], [phone]    Nausea & Vomiting  Have saltine crackers or pretzels by your bed and eat a few bites before you raise your head out of bed in the morning  Eat small frequent meals throughout the day instead of large meals  Drink plenty of fluids throughout the day to stay hydrated, just don't drink a lot of fluids with your meals.  This can make your stomach fill up faster making you feel sick  Do not brush your teeth right after you eat  Products with real ginger are good for nausea, like ginger ale and ginger hard candy Make sure it says made with real ginger!  Sucking on sour candy like lemon heads is also good for nausea  If your prenatal vitamins make you nauseated, take them at night so you will sleep through the nausea  Sea Bands  If you feel like you need medicine for the nausea & vomiting please let us know  If you are unable to keep any fluids or food down please let us know   Constipation  Drink plenty of fluid, preferably water, throughout the day  Eat foods high in fiber such as fruits, vegetables, and grains  Exercise, such as walking, is a good way to keep your bowels regular  Drink warm fluids, especially warm prune juice, or decaf coffee  Eat a 1/2 cup of real oatmeal (not instant), 1/2 cup applesauce, and 1/2-1 cup warm prune juice every day  If needed, you may take Colace (docusate sodium) stool softener once or twice a day to help keep the stool soft. If you are pregnant, wait until  you are out of your first trimester (12-14 weeks of pregnancy)  If you still are having problems with constipation, you may take Miralax once daily as needed to help keep your bowels regular.  If you are pregnant, wait until you are out of your first trimester (12-14 weeks of pregnancy)   First Trimester of Pregnancy The first trimester of pregnancy is from week 1 until the end of week 12 (months 1 through 3). A week after a sperm fertilizes an egg, the egg will implant on the wall of the uterus. This embryo will begin to develop into a baby. Genes from you and your partner are forming the baby. The female genes determine whether the baby is a boy or a girl. At 6-8 weeks, the eyes and face are formed, and the heartbeat can be seen on ultrasound. At the end of 12 weeks, all the baby's organs are formed.  Now that you are pregnant, you will want to do everything you can to have a healthy baby. Two of the most important things are to get good prenatal care and to follow your health care provider's instructions. Prenatal care is all the medical care you receive before the baby's birth. This care will help prevent, find, and treat any problems during the pregnancy and childbirth. BODY CHANGES Your body goes through many changes during pregnancy. The changes vary from woman  to woman.   You may gain or lose a couple of pounds at first.  You may feel sick to your stomach (nauseous) and throw up (vomit). If the vomiting is uncontrollable, call your health care provider.  You may tire easily.  You may develop headaches that can be relieved by medicines approved by your health care provider.  You may urinate more often. Painful urination may mean you have a bladder infection.  You may develop heartburn as a result of your pregnancy.  You may develop constipation because certain hormones are causing the muscles that push waste through your intestines to slow down.  You may develop hemorrhoids or swollen,  bulging veins (varicose veins).  Your breasts may begin to grow larger and become tender. Your nipples may stick out more, and the tissue that surrounds them (areola) may become darker.  Your gums may bleed and may be sensitive to brushing and flossing.  Dark spots or blotches (chloasma, mask of pregnancy) may develop on your face. This will likely fade after the baby is born.  Your menstrual periods will stop.  You may have a loss of appetite.  You may develop cravings for certain kinds of food.  You may have changes in your emotions from day to day, such as being excited to be pregnant or being concerned that something may go wrong with the pregnancy and baby.  You may have more vivid and strange dreams.  You may have changes in your hair. These can include thickening of your hair, rapid growth, and changes in texture. Some women also have hair loss during or after pregnancy, or hair that feels dry or thin. Your hair will most likely return to normal after your baby is born. WHAT TO EXPECT AT YOUR PRENATAL VISITS During a routine prenatal visit:  You will be weighed to make sure you and the baby are growing normally.  Your blood pressure will be taken.  Your abdomen will be measured to track your baby's growth.  The fetal heartbeat will be listened to starting around week 10 or 12 of your pregnancy.  Test results from any previous visits will be discussed. Your health care provider may ask you:  How you are feeling.  If you are feeling the baby move.  If you have had any abnormal symptoms, such as leaking fluid, bleeding, severe headaches, or abdominal cramping.  If you have any questions. Other tests that may be performed during your first trimester include:  Blood tests to find your blood type and to check for the presence of any previous infections. They will also be used to check for low iron levels (anemia) and Rh antibodies. Later in the pregnancy, blood tests for  diabetes will be done along with other tests if problems develop.  Urine tests to check for infections, diabetes, or protein in the urine.  An ultrasound to confirm the proper growth and development of the baby.  An amniocentesis to check for possible genetic problems.  Fetal screens for spina bifida and Down syndrome.  You may need other tests to make sure you and the baby are doing well. HOME CARE INSTRUCTIONS  Medicines  Follow your health care provider's instructions regarding medicine use. Specific medicines may be either safe or unsafe to take during pregnancy.  Take your prenatal vitamins as directed.  If you develop constipation, try taking a stool softener if your health care provider approves. Diet  Eat regular, well-balanced meals. Choose a variety of foods, such as  meat or vegetable-based protein, fish, milk and low-fat dairy products, vegetables, fruits, and whole grain breads and cereals. Your health care provider will help you determine the amount of weight gain that is right for you.  Avoid raw meat and uncooked cheese. These carry germs that can cause birth defects in the baby.  Eating four or five small meals rather than three large meals a day may help relieve nausea and vomiting. If you start to feel nauseous, eating a few soda crackers can be helpful. Drinking liquids between meals instead of during meals also seems to help nausea and vomiting.  If you develop constipation, eat more high-fiber foods, such as fresh vegetables or fruit and whole grains. Drink enough fluids to keep your urine clear or pale yellow. Activity and Exercise  Exercise only as directed by your health care provider. Exercising will help you:  Control your weight.  Stay in shape.  Be prepared for labor and delivery.  Experiencing pain or cramping in the lower abdomen or low back is a good sign that you should stop exercising. Check with your health care provider before continuing normal  exercises.  Try to avoid standing for long periods of time. Move your legs often if you must stand in one place for a long time.  Avoid heavy lifting.  Wear low-heeled shoes, and practice good posture.  You may continue to have sex unless your health care provider directs you otherwise. Relief of Pain or Discomfort  Wear a good support bra for breast tenderness.   Take warm sitz baths to soothe any pain or discomfort caused by hemorrhoids. Use hemorrhoid cream if your health care provider approves.   Rest with your legs elevated if you have leg cramps or low back pain.  If you develop varicose veins in your legs, wear support hose. Elevate your feet for 15 minutes, 3-4 times a day. Limit salt in your diet. Prenatal Care  Schedule your prenatal visits by the twelfth week of pregnancy. They are usually scheduled monthly at first, then more often in the last 2 months before delivery.  Write down your questions. Take them to your prenatal visits.  Keep all your prenatal visits as directed by your health care provider. Safety  Wear your seat belt at all times when driving.  Make a list of emergency phone numbers, including numbers for family, friends, the hospital, and police and fire departments. General Tips  Ask your health care provider for a referral to a local prenatal education class. Begin classes no later than at the beginning of month 6 of your pregnancy.  Ask for help if you have counseling or nutritional needs during pregnancy. Your health care provider can offer advice or refer you to specialists for help with various needs.  Do not use hot tubs, steam rooms, or saunas.  Do not douche or use tampons or scented sanitary pads.  Do not cross your legs for long periods of time.  Avoid cat litter boxes and soil used by cats. These carry germs that can cause birth defects in the baby and possibly loss of the fetus by miscarriage or stillbirth.  Avoid all smoking,  herbs, alcohol, and medicines not prescribed by your health care provider. Chemicals in these affect the formation and growth of the baby.  Schedule a dentist appointment. At home, brush your teeth with a soft toothbrush and be gentle when you floss. SEEK MEDICAL CARE IF:   You have dizziness.  You have mild pelvic cramps,  pelvic pressure, or nagging pain in the abdominal area.  You have persistent nausea, vomiting, or diarrhea.  You have a bad smelling vaginal discharge.  You have pain with urination.  You notice increased swelling in your face, hands, legs, or ankles. SEEK IMMEDIATE MEDICAL CARE IF:   You have a fever.  You are leaking fluid from your vagina.  You have spotting or bleeding from your vagina.  You have severe abdominal cramping or pain.  You have rapid weight gain or loss.  You vomit blood or material that looks like coffee grounds.  You are exposed to [place] and have never had them.  You are exposed to fifth disease or chickenpox.  You develop a severe headache.  You have shortness of breath.  You have any kind of trauma, such as from a fall or a car accident. Document Released: 11/21/2001 Document Revised: 04/13/2014 Document Reviewed: 10/07/2013 [facility] Patient Information 2015 [place], [place]. This information is not intended to replace advice given to you by your health care provider. Make sure you discuss any questions you have with your health care provider.

## 2018-10-29 ENCOUNTER — Other Ambulatory Visit: Payer: Self-pay | Admitting: Women's Health

## 2018-10-29 DIAGNOSIS — Z79899 Other long term (current) drug therapy: Secondary | ICD-10-CM | POA: Insufficient documentation

## 2018-10-29 DIAGNOSIS — Z3682 Encounter for antenatal screening for nuchal translucency: Secondary | ICD-10-CM

## 2018-10-29 DIAGNOSIS — F1120 Opioid dependence, uncomplicated: Secondary | ICD-10-CM | POA: Insufficient documentation

## 2018-10-29 LAB — OBSTETRIC PANEL, INCLUDING HIV
Antibody Screen: NEGATIVE
BASOS ABS: 0 10*3/uL (ref 0.0–0.2)
Basos: 0 %
EOS (ABSOLUTE): 0.1 10*3/uL (ref 0.0–0.4)
Eos: 1 %
HEP B S AG: NEGATIVE
HIV SCREEN 4TH GENERATION: NONREACTIVE
Hematocrit: 36 % (ref 34.0–46.6)
Hemoglobin: 12.4 g/dL (ref 11.1–15.9)
IMMATURE GRANULOCYTES: 0 %
Immature Grans (Abs): 0 10*3/uL (ref 0.0–0.1)
LYMPHS ABS: 2.1 10*3/uL (ref 0.7–3.1)
Lymphs: 17 %
MCH: 30.7 pg (ref 26.6–33.0)
MCHC: 34.4 g/dL (ref 31.5–35.7)
MCV: 89 fL (ref 79–97)
MONOS ABS: 0.7 10*3/uL (ref 0.1–0.9)
Monocytes: 6 %
NEUTROS PCT: 76 %
Neutrophils Absolute: 9.8 10*3/uL — ABNORMAL HIGH (ref 1.4–7.0)
PLATELETS: 252 10*3/uL (ref 150–450)
RBC: 4.04 x10E6/uL (ref 3.77–5.28)
RDW: 12.2 % — ABNORMAL LOW (ref 12.3–15.4)
RPR Ser Ql: NONREACTIVE
Rh Factor: POSITIVE
Rubella Antibodies, IGG: 7.69 index (ref >0.99)
WBC: 12.9 10*3/uL — ABNORMAL HIGH (ref 3.4–10.8)

## 2018-10-29 LAB — URINALYSIS, ROUTINE W REFLEX MICROSCOPIC
Bilirubin, UA: NEGATIVE
GLUCOSE, UA: NEGATIVE
KETONES UA: NEGATIVE
Leukocytes, UA: NEGATIVE
Nitrite, UA: NEGATIVE
PROTEIN UA: NEGATIVE
RBC, UA: NEGATIVE
SPEC GRAV UA: 1.013 (ref 1.005–1.030)
UUROB: 0.2 mg/dL (ref 0.2–1.0)
pH, UA: 5.5 (ref 5.0–7.5)

## 2018-10-29 LAB — PMP SCREEN PROFILE (10S), URINE
AMPHETAMINE SCREEN URINE: NEGATIVE ng/mL
BARBITURATE SCREEN URINE: NEGATIVE ng/mL
BENZODIAZEPINE SCREEN, URINE: NEGATIVE ng/mL
CANNABINOIDS UR QL SCN: NEGATIVE ng/mL
COCAINE(METAB.)SCREEN, URINE: NEGATIVE ng/mL
Creatinine(Crt), U: 46.5 mg/dL (ref 20.0–300.0)
Methadone Screen, Urine: NEGATIVE ng/mL
OXYCODONE+OXYMORPHONE UR QL SCN: NEGATIVE ng/mL
Opiate Scrn, Ur: NEGATIVE ng/mL
PHENCYCLIDINE QUANTITATIVE URINE: NEGATIVE ng/mL
Ph of Urine: 5.7 (ref 4.5–8.9)
Propoxyphene Scrn, Ur: NEGATIVE ng/mL

## 2018-10-30 ENCOUNTER — Other Ambulatory Visit: Payer: Medicaid Other

## 2018-10-30 ENCOUNTER — Ambulatory Visit (INDEPENDENT_AMBULATORY_CARE_PROVIDER_SITE_OTHER): Payer: Medicaid Other

## 2018-10-30 DIAGNOSIS — F17200 Nicotine dependence, unspecified, uncomplicated: Secondary | ICD-10-CM

## 2018-10-30 DIAGNOSIS — Z3682 Encounter for antenatal screening for nuchal translucency: Secondary | ICD-10-CM

## 2018-10-30 DIAGNOSIS — Z3402 Encounter for supervision of normal first pregnancy, second trimester: Secondary | ICD-10-CM

## 2018-10-30 LAB — CYTOLOGY - PAP
Chlamydia: NEGATIVE
DIAGNOSIS: NEGATIVE
Neisseria Gonorrhea: NEGATIVE

## 2018-10-30 LAB — URINE CULTURE

## 2018-10-30 NOTE — Progress Notes (Signed)
US 13 wks,measurements c/w dates,normal ovaries bilat,NB present,NT 1.9 mm,crl 65.88 mm,fhr 174 bpm,posterior placenta gr 0,subchorionic hemorrhage x 2,(#1) superior left 2.9 x 2.2 x 2.3 cm (#2) inferior left 3.9 x 4.5 x 2.9 cm,fhr 174 bpm

## 2018-11-01 LAB — INTEGRATED 1
Crown Rump Length: 65.9 mm
Gest. Age on Collection Date: 12.7 weeks
Maternal Age at EDD: 23.3 yr
Nuchal Translucency (NT): 1.9 mm
Number of Fetuses: 1
PAPP-A Value: 885.3 ng/mL
Weight: 152 [lb_av]

## 2018-11-02 LAB — CYSTIC FIBROSIS MUTATION 97: Interpretation: NOT DETECTED

## 2018-11-25 ENCOUNTER — Ambulatory Visit (INDEPENDENT_AMBULATORY_CARE_PROVIDER_SITE_OTHER): Payer: Medicaid Other | Admitting: Women's Health

## 2018-11-25 ENCOUNTER — Other Ambulatory Visit: Payer: Self-pay

## 2018-11-25 ENCOUNTER — Encounter: Payer: Self-pay | Admitting: Women's Health

## 2018-11-25 VITALS — BP 123/80 | HR 98 | Wt 156.0 lb

## 2018-11-25 DIAGNOSIS — Z1379 Encounter for other screening for genetic and chromosomal anomalies: Secondary | ICD-10-CM

## 2018-11-25 DIAGNOSIS — Z363 Encounter for antenatal screening for malformations: Secondary | ICD-10-CM

## 2018-11-25 DIAGNOSIS — Z3A16 16 weeks gestation of pregnancy: Secondary | ICD-10-CM

## 2018-11-25 DIAGNOSIS — Z331 Pregnant state, incidental: Secondary | ICD-10-CM

## 2018-11-25 DIAGNOSIS — Z1389 Encounter for screening for other disorder: Secondary | ICD-10-CM

## 2018-11-25 DIAGNOSIS — Z3402 Encounter for supervision of normal first pregnancy, second trimester: Secondary | ICD-10-CM

## 2018-11-25 LAB — POCT URINALYSIS DIPSTICK OB
Glucose, UA: NEGATIVE
KETONES UA: NEGATIVE
LEUKOCYTES UA: NEGATIVE
NITRITE UA: NEGATIVE
PROTEIN: NEGATIVE
RBC UA: NEGATIVE

## 2018-11-25 MED ORDER — AMOXICILLIN-POT CLAVULANATE 875-125 MG PO TABS: 1.0000 | ORAL_TABLET | Freq: Two times a day (BID) | ORAL | 0 refills | Status: DC

## 2018-11-25 NOTE — Patient Instructions (Signed)
Andrea [name], I greatly value your feedback.  If you receive a survey following your visit with us today, we appreciate you taking the time to fill it out.  Thanks, Andrea [name], CNM, WHNP-BC   Second Trimester of Pregnancy The second trimester is from week 14 through week 27 (months 4 through 6). The second trimester is often a time when you feel your best. Your body has adjusted to being pregnant, and you begin to feel better physically. Usually, morning sickness has lessened or quit completely, you may have more energy, and you may have an increase in appetite. The second trimester is also a time when the fetus is growing rapidly. At the end of the sixth month, the fetus is about 9 inches long and weighs about 1 pounds. You will likely begin to feel the baby move (quickening) between 16 and 20 weeks of pregnancy. Body changes during your second trimester Your body continues to go through many changes during your second trimester. The changes vary from woman to woman.  Your weight will continue to increase. You will notice your lower abdomen bulging out.  You may begin to get stretch marks on your hips, abdomen, and breasts.  You may develop headaches that can be relieved by medicines. The medicines should be approved by your health care provider.  You may urinate more often because the fetus is pressing on your bladder.  You may develop or continue to have heartburn as a result of your pregnancy.  You may develop constipation because certain hormones are causing the muscles that push waste through your intestines to slow down.  You may develop hemorrhoids or swollen, bulging veins (varicose veins).  You may have back pain. This is caused by: ? Weight gain. ? Pregnancy hormones that are relaxing the joints in your pelvis. ? A shift in weight and the muscles that support your balance.  Your breasts will continue to grow and they will continue to become tender.  Your gums may bleed  and may be sensitive to brushing and flossing.  Dark spots or blotches (chloasma, mask of pregnancy) may develop on your face. This will likely fade after the baby is born.  A dark line from your belly button to the pubic area (linea nigra) may appear. This will likely fade after the baby is born.  You may have changes in your hair. These can include thickening of your hair, rapid growth, and changes in texture. Some women also have hair loss during or after pregnancy, or hair that feels dry or thin. Your hair will most likely return to normal after your baby is born.  What to expect at prenatal visits During a routine prenatal visit:  You will be weighed to make sure you and the fetus are growing normally.  Your blood pressure will be taken.  Your abdomen will be measured to track your baby's growth.  The fetal heartbeat will be listened to.  Any test results from the previous visit will be discussed.  Your health care provider may ask you:  How you are feeling.  If you are feeling the baby move.  If you have had any abnormal symptoms, such as leaking fluid, bleeding, severe headaches, or abdominal cramping.  If you are using any tobacco products, including cigarettes, chewing tobacco, and electronic cigarettes.  If you have any questions.  Other tests that may be performed during your second trimester include:  Blood tests that check for: ? Low iron levels (anemia). ? High  blood sugar that affects pregnant women (gestational diabetes) between 3 and 28 weeks. ? Rh antibodies. This is to check for a protein on red blood cells (Rh factor).  Urine tests to check for infections, diabetes, or protein in the urine.  An ultrasound to confirm the proper growth and development of the baby.  An amniocentesis to check for possible genetic problems.  Fetal screens for spina bifida and Down syndrome.  HIV (human immunodeficiency virus) testing. Routine prenatal testing includes  screening for HIV, unless you choose not to have this test.  Follow these instructions at home: Medicines  Follow your health care provider's instructions regarding medicine use. Specific medicines may be either safe or unsafe to take during pregnancy.  Take a prenatal vitamin that contains at least 600 micrograms (mcg) of folic acid.  If you develop constipation, try taking a stool softener if your health care provider approves. Eating and drinking  Eat a balanced diet that includes fresh fruits and vegetables, whole grains, good sources of protein such as meat, eggs, or tofu, and low-fat dairy. Your health care provider will help you determine the amount of weight gain that is right for you.  Avoid raw meat and uncooked cheese. These carry germs that can cause birth defects in the baby.  If you have low calcium intake from food, talk to your health care provider about whether you should take a daily calcium supplement.  Limit foods that are high in fat and processed sugars, such as fried and sweet foods.  To prevent constipation: ? Drink enough fluid to keep your urine clear or pale yellow. ? Eat foods that are high in fiber, such as fresh fruits and vegetables, whole grains, and beans. Activity  Exercise only as directed by your health care provider. Most women can continue their usual exercise routine during pregnancy. Try to exercise for 30 minutes at least 5 days a week. Stop exercising if you experience uterine contractions.  Avoid heavy lifting, wear low heel shoes, and practice good posture.  A sexual relationship may be continued unless your health care provider directs you otherwise. Relieving pain and discomfort  Wear a good support bra to prevent discomfort from breast tenderness.  Take warm sitz baths to soothe any pain or discomfort caused by hemorrhoids. Use hemorrhoid cream if your health care provider approves.  Rest with your legs elevated if you have leg cramps  or low back pain.  If you develop varicose veins, wear support hose. Elevate your feet for 15 minutes, 3-4 times a day. Limit salt in your diet. Prenatal Care  Write down your questions. Take them to your prenatal visits.  Keep all your prenatal visits as told by your health care provider. This is important. Safety  Wear your seat belt at all times when driving.  Make a list of emergency phone numbers, including numbers for family, friends, the hospital, and police and fire departments. General instructions  Ask your health care provider for a referral to a local prenatal education class. Begin classes no later than the beginning of month 6 of your pregnancy.  Ask for help if you have counseling or nutritional needs during pregnancy. Your health care provider can offer advice or refer you to specialists for help with various needs.  Do not use hot tubs, steam rooms, or saunas.  Do not douche or use tampons or scented sanitary pads.  Do not cross your legs for long periods of time.  Avoid cat litter boxes and soil  used by cats. These carry germs that can cause birth defects in the baby and possibly loss of the fetus by miscarriage or stillbirth.  Avoid all smoking, herbs, alcohol, and unprescribed drugs. Chemicals in these products can affect the formation and growth of the baby.  Do not use any products that contain nicotine or tobacco, such as cigarettes and e-cigarettes. If you need help quitting, ask your health care provider.  Visit your dentist if you have not gone yet during your pregnancy. Use a soft toothbrush to brush your teeth and be gentle when you floss. Contact a health care provider if:  You have dizziness.  You have mild pelvic cramps, pelvic pressure, or nagging pain in the abdominal area.  You have persistent nausea, vomiting, or diarrhea.  You have a bad smelling vaginal discharge.  You have pain when you urinate. Get help right away if:  You have a  fever.  You are leaking fluid from your vagina.  You have spotting or bleeding from your vagina.  You have severe abdominal cramping or pain.  You have rapid weight gain or weight loss.  You have shortness of breath with chest pain.  You notice sudden or extreme swelling of your face, hands, ankles, feet, or legs.  You have not felt your baby move in over an hour.  You have severe headaches that do not go away when you take medicine.  You have vision changes. Summary  The second trimester is from week 14 through week 27 (months 4 through 6). It is also a time when the fetus is growing rapidly.  Your body goes through many changes during pregnancy. The changes vary from woman to woman.  Avoid all smoking, herbs, alcohol, and unprescribed drugs. These chemicals affect the formation and growth your baby.  Do not use any tobacco products, such as cigarettes, chewing tobacco, and e-cigarettes. If you need help quitting, ask your health care provider.  Contact your health care provider if you have any questions. Keep all prenatal visits as told by your health care provider. This is important. This information is not intended to replace advice given to you by your health care provider. Make sure you discuss any questions you have with your health care provider. Document Released: 11/21/2001 Document Revised: 05/04/2016 Document Reviewed: 01/28/2013 [employer]vier Interactive Patient Education  2017 [employer].

## 2018-11-25 NOTE — Progress Notes (Signed)
LOW-RISK PREGNANCY VISIT Patient name: Andrea [name] MRN [id number]  Date of birth: Jul 29, 1996 Chief Complaint:   Routine Prenatal Visit (2nd IT)  History of Present Illness:   Andrea [name] is a 22 y.o. G1P0000 female at [redacted]w[redacted]d with an Estimated Date of Delivery: 05/07/19 being seen today for ongoing management of a low-risk pregnancy.  Today she reports Lt ear pain and drainage and trouble hearing x , keeps forgetting to mention it. H/O opiate addiction, on suboxone 12.5mg  daily, has appt w/ Subutex provider in [place] this week.  . Vag. Bleeding: None.  Movement: Present. denies leaking of fluid. Review of Systems:   Pertinent items are noted in HPI Denies abnormal vaginal discharge w/ itching/odor/irritation, headaches, visual changes, shortness of breath, chest pain, abdominal pain, severe nausea/vomiting, or problems with urination or bowel movements unless otherwise stated above. Pertinent History Reviewed:  Reviewed past medical,surgical, social, obstetrical and family history.  Reviewed problem list, medications and allergies. Physical Assessment:   Vitals:   11/25/18 1523  BP: 123/80  Pulse: 98  Weight: 156 lb (70.8 kg)  Body mass index is 31.51 kg/m.        Physical Examination:   General appearance: Well appearing, and in no distress  Mental status: Alert, oriented to person, place, and time  Ears: Ears - right ear white patches on TM, Lt ear bulging TM w/ white patches and erythematous areas, tender to exam, large amount wax in canal   Skin: Warm & dry  Cardiovascular: Normal heart rate noted  Respiratory: Normal respiratory effort, no distress  Abdomen: Soft, gravid, nontender  Pelvic: Cervical exam deferred         Extremities: Edema: Trace  Fetal Status: Fetal Heart Rate (bpm): 151   Movement: Present    Results for orders placed or performed in visit on 11/25/18 (from the past 24 hour(s))  POC Urinalysis Dipstick OB   Collection Time: 11/25/18   3:22 PM  Result Value Ref Range   Color, UA     Clarity, UA     Glucose, UA Negative Negative   Bilirubin, UA     Ketones, UA neg    Spec Grav, UA     Blood, UA neg    pH, UA     POC,PROTEIN,UA Negative Negative, Trace, Small (1+), Moderate (2+), Large (3+), 4+   Urobilinogen, UA     Nitrite, UA neg    Leukocytes, UA Negative Negative   Appearance     Odor      Assessment & Plan:  1) Low-risk pregnancy G1P0000 at [redacted]w[redacted]d with an Estimated Date of Delivery: 05/07/19   2) H/O opiate addiction, on suboxone 12.5mg  daily-lost her provider in [place], has appt w/ provider in [place] this week to switch to subutex  3) Lt otitis media> rx augmentin, get otc drops to clear wax   Meds:  Meds ordered this encounter  Medications  . amoxicillin-clavulanate (AUGMENTIN) 875-125 MG tablet    Sig: Take 1 tablet by mouth 2 (two) times daily. X 7days    Dispense:  14 tablet    Refill:  0    Order Specific Question:   Supervising Provider    Answer:   Lazaro Arms [[id number]]   Labs/procedures today: 2nd IT  Plan:  Continue routine obstetrical care   Reviewed: Preterm labor symptoms and general obstetric precautions including but not limited to vaginal bleeding, contractions, leaking of fluid and fetal movement were reviewed in detail with the patient.  All  questions were answered  Follow-up: Return in about 2 weeks (around 12/09/2018) for LROB, UJ:[id number]:Anatomy.  Orders Placed This Encounter  Procedures  . US OB Comp + 14 Wk  . INTEGRATED 2  . POC Urinalysis Dipstick OB   Cheral MarkerKimberly R Ngai Parcell CNM, [facility]NP-BC 11/25/2018 3:47 PM

## 2018-11-27 LAB — INTEGRATED 2
AFP MOM: 1.16
Alpha-Fetoprotein: 38.1 ng/mL
Crown Rump Length: 65.9 mm
DIA MoM: 2.44
DIA Value: 398.2 pg/mL
Estriol, Unconjugated: 1.41 ng/mL
GEST. AGE ON COLLECTION DATE: 12.7 wk
Gestational Age: 16.4 weeks
HCG MOM: 1.79
HCG VALUE: 59 [IU]/mL
MATERNAL AGE AT EDD: 23.3 a
NUCHAL TRANSLUCENCY (NT): 1.9 mm
Nuchal Translucency MoM: 1.28
Number of Fetuses: 1
PAPP-A MoM: 0.87
PAPP-A VALUE: 885.3 ng/mL
Test Results:: NEGATIVE
WEIGHT: 152 [lb_av]
Weight: 152 [lb_av]
uE3 MoM: 1.48

## 2018-12-05 ENCOUNTER — Telehealth: Payer: Self-pay | Admitting: Women's Health

## 2018-12-05 NOTE — Telephone Encounter (Signed)
Patient called, is sick with a sore throat, really red, possibly a low grade fever, nauseated.  She would like to know what she can take.  [facility]  [phone]

## 2018-12-11 NOTE — L&D Delivery Note (Signed)
OB/GYN Faculty Practice Delivery Note  Andrea [name] is a 23 y.o. G1P0000 s/p VAVD at [redacted]w[redacted]d. She was admitted for induction of labor for gestational hypertension.   ROM: 8h 39m with clear fluid GBS Status: negative Maximum Maternal Temperature: Temp (24hrs), Avg:98.7 F (37.1 C), Min:98.1 F (36.7 C), Max:99.3 F (37.4 C)  Labor Progress: . Induction started with pitocin given favorable cervix . FB placed . Continued on pitocin for just over 12 hours, 4-hour break given minimal change . Pitocin restarted, SROM clear fluid . Progressed to complete  Delivery Date/Time: 04/25/19 at 2315 Delivery: Called to room and patient had been complete for nearly 2 hours and laboring down. Pushed for an hour with minimal progress so labored down with peanut ball for additional 30 minutes. Resumed pushing for another hour after epidural turned off to help with feeling contractions. Progressed to +3 station, offered vacuum assistance to help with delivery given prolonged 2nd stage. Risks/benefits explained by Dr. Emelda Fear and patient elected to proceed with vacuum. Kiwi vacuum placed and head delivered LOA with 4 pulls over the course of 2 contractions. No nuchal cord present. Shoulder and body delivered in usual fashion. Infant with spontaneous cry, placed on mother's abdomen, dried and stimulated. Cord clamped x 2 after 1-minute delay, and cut by father of baby. Cord blood drawn. Placenta delivered spontaneously with gentle cord traction. Fundus firm with massage and Pitocin. Labia, perineum, vagina, and cervix inspected inspected with 3A laceration.   Placenta: spontaneous, intact, 3-vessel cord Complications: vacuum assistance Lacerations: 3A - repaired in usual fashion with 3-0 Vicryl EBL: 329cc per triton Analgesia: epidural and lidocaine  Postpartum Planning [x]  message to sent to schedule follow-up  [x]  vaccines UTD  Infant: Vigorous female  APGARs 8, 9  weight pending but appears  AGA  Brighid Koch S. Earlene Plater, DO OB/GYN Fellow, Faculty Practice

## 2018-12-12 ENCOUNTER — Ambulatory Visit (INDEPENDENT_AMBULATORY_CARE_PROVIDER_SITE_OTHER): Payer: Medicaid Other

## 2018-12-12 DIAGNOSIS — Z363 Encounter for antenatal screening for malformations: Secondary | ICD-10-CM | POA: Diagnosis not present

## 2018-12-12 DIAGNOSIS — Z3402 Encounter for supervision of normal first pregnancy, second trimester: Secondary | ICD-10-CM

## 2018-12-12 DIAGNOSIS — F17200 Nicotine dependence, unspecified, uncomplicated: Secondary | ICD-10-CM

## 2018-12-12 NOTE — Progress Notes (Signed)
[place] 19+1 wks,breech,posterior placenta gr 0,normal ovaries bilat,cx 3.1 cm,svp of fluid 6 cm,fhr 150 bpm,efw 264 g ,anatomy complete,no obvious abnormalities

## 2018-12-13 ENCOUNTER — Encounter: Payer: Medicaid Other | Admitting: Women's Health

## 2018-12-19 ENCOUNTER — Encounter: Payer: Medicaid Other | Admitting: Women's Health

## 2018-12-26 ENCOUNTER — Encounter: Payer: Medicaid Other | Admitting: Advanced Practice Midwife

## 2019-01-02 ENCOUNTER — Ambulatory Visit (INDEPENDENT_AMBULATORY_CARE_PROVIDER_SITE_OTHER): Payer: Medicaid Other | Admitting: Obstetrics and Gynecology

## 2019-01-02 VITALS — BP 134/83 | HR 108 | Wt 164.8 lb

## 2019-01-02 DIAGNOSIS — Z3A22 22 weeks gestation of pregnancy: Secondary | ICD-10-CM

## 2019-01-02 DIAGNOSIS — Z331 Pregnant state, incidental: Secondary | ICD-10-CM

## 2019-01-02 DIAGNOSIS — Z1389 Encounter for screening for other disorder: Secondary | ICD-10-CM

## 2019-01-02 DIAGNOSIS — Z3402 Encounter for supervision of normal first pregnancy, second trimester: Secondary | ICD-10-CM

## 2019-01-02 LAB — POCT URINALYSIS DIPSTICK OB
Glucose, UA: NEGATIVE
Ketones, UA: NEGATIVE
Leukocytes, UA: NEGATIVE
Nitrite, UA: NEGATIVE
POC,PROTEIN,UA: NEGATIVE
RBC UA: NEGATIVE

## 2019-01-02 NOTE — Progress Notes (Signed)
   LOW-RISK PREGNANCY VISIT Patient name: Andrea [name] MRN [id number]  Date of birth: September 19, 1996 Chief Complaint:   Routine Prenatal Visit  History of Present Illness:   Andrea [name] is a 23 y.o. G1P0000 female at [redacted]w[redacted]d with an Estimated Date of Delivery: 05/07/19 being seen today for ongoing management of a low-risk pregnancy. She works at a [employer] and stands on her feet 8 hours 7 days a week.  Today she reports no complaints.  .  .   . denies leaking of fluid. Review of Systems:   Pertinent items are noted in HPI Denies abnormal vaginal discharge w/ itching/odor/irritation, headaches, visual changes, shortness of breath, chest pain, abdominal pain, severe nausea/vomiting, or problems with urination or bowel movements unless otherwise stated above. Pertinent History Reviewed:  Reviewed past medical,surgical, social, obstetrical and family history.  Reviewed problem list, medications and allergies. Physical Assessment:  There were no vitals filed for this visit.There is no height or weight on file to calculate BMI.        Physical Examination:   General appearance: Well appearing, and in no distress  Mental status: Alert, oriented to person, place, and time  Skin: Warm & dry  Cardiovascular: Normal heart rate noted  Respiratory: Normal respiratory effort, no distress  Abdomen: Soft, gravid, nontender  Pelvic: Cervical exam deferred         Extremities:    Fetal Status:          No results found for this or any previous visit (from the past 24 hour(s)).  Assessment & Plan:  1) Low-risk pregnancy G1P0000 at [redacted]w[redacted]d with an Estimated Date of Delivery: 05/07/19    Meds: No orders of the defined types were placed in this encounter.  Labs/procedures today: N/A  Plan:  Continue routine obstetrical care. Follow up in 4 weeks for LROB and PN2.   Reviewed: Preterm labor symptoms and general obstetric precautions including but not limited to vaginal bleeding,  contractions, leaking of fluid and fetal movement were reviewed in detail with the patient.  All questions were answered  Follow-up: No follow-ups on file.  4-week visit for prenatal 2 labs and routine low risk OB appointment  Orders Placed This Encounter  Procedures  . POC Urinalysis Dipstick OB   Tilda Burrow, MD 01/02/2019 4:03 PM   By signing my name below, I, Soijett Blue, attest that this documentation has been prepared under the direction and in the presence of Tilda Burrow, MD. Electronically Signed: Soijett Blue, Stage manager. 01/02/19. 4:03 PM.  I personally performed the services described in this documentation, which was SCRIBED in my presence. The recorded information has been reviewed and considered accurate. It has been edited as necessary during review. Tilda Burrow, MD

## 2019-01-30 ENCOUNTER — Other Ambulatory Visit: Payer: Medicaid Other

## 2019-01-30 ENCOUNTER — Ambulatory Visit (INDEPENDENT_AMBULATORY_CARE_PROVIDER_SITE_OTHER): Payer: Medicaid Other | Admitting: Advanced Practice Midwife

## 2019-01-30 VITALS — BP 121/80 | HR 93 | Wt 167.0 lb

## 2019-01-30 DIAGNOSIS — Z3A26 26 weeks gestation of pregnancy: Secondary | ICD-10-CM

## 2019-01-30 DIAGNOSIS — Z3402 Encounter for supervision of normal first pregnancy, second trimester: Secondary | ICD-10-CM

## 2019-01-30 DIAGNOSIS — F1121 Opioid dependence, in remission: Secondary | ICD-10-CM

## 2019-01-30 DIAGNOSIS — Z331 Pregnant state, incidental: Secondary | ICD-10-CM

## 2019-01-30 DIAGNOSIS — Z1389 Encounter for screening for other disorder: Secondary | ICD-10-CM

## 2019-01-30 NOTE — Progress Notes (Signed)
LOW-RISK PREGNANCY VISIT Patient name: Andrea [name] MRN [id number]  Date of birth: [birth year] Chief Complaint:   Routine Prenatal Visit (PN2)  History of Present Illness:   Andrea [name] is a 23 y.o. G1P0000 female at [redacted]w[redacted]d with an Estimated Date of Delivery: 05/07/19 being seen today for ongoing management of a low-risk pregnancy.  Today she reports no complaints. Contractions: Not present. Vag. Bleeding: None.  Movement: Present. denies leaking of fluid. Review of Systems:   Pertinent items are noted in HPI Denies abnormal vaginal discharge w/ itching/odor/irritation, headaches, visual changes, shortness of breath, chest pain, abdominal pain, severe nausea/vomiting, or problems with urination or bowel movements unless otherwise stated above.  Pertinent History Reviewed:  Medical & Surgical Hx:   Past Medical History:  Diagnosis Date  . ADHD (attention deficit hyperactivity disorder)   . Anxiety   . Arthritis   . Asthma   . History of kidney stones   . Migraines   . Ovarian cyst   . Parent-child relational problem   . Seizures (HCC)   . Subchorionic hemorrhage in first trimester 10/17/2018   Recheck at 19 weeks per JVF  . Vaginal Pap smear, abnormal    Past Surgical History:  Procedure Laterality Date  . CERVICAL ABLATION N/A 05/23/2017   Procedure: Laser Ablation of Cervix;  Surgeon: Lazaro Arms, MD;  Location: [facility];  Service: Gynecology;  Laterality: N/A;  . COLPOSCOPY    . EXTRACORPOREAL SHOCK WAVE LITHOTRIPSY    . WISDOM TOOTH EXTRACTION     Family History  Problem Relation Age of Onset  . Birth defects Maternal Grandmother   . Cancer Maternal Grandmother   . Diabetes Maternal Grandmother   . Heart disease Maternal Grandmother     Current Outpatient Medications:  .  albuterol (PROVENTIL HFA;VENTOLIN HFA) 108 (90 Base) MCG/ACT inhaler, Inhale 2 puffs into the lungs every 6 (six) hours as needed for wheezing or shortness of breath., Disp: 1 Inhaler, Rfl:  2 .  buprenorphine (SUBUTEX) 2 MG SUBL SL tablet, Place 2 mg under the tongue 2 (two) times daily., Disp: , Rfl:  .  prenatal vitamin w/FE, FA (PRENATAL 1 + 1) 27-1 MG TABS tablet, Take 1 tablet by mouth daily at 12 noon., Disp: 30 each, Rfl: 12 Social History: Reviewed -  reports that she has been smoking cigarettes. She has a 1.50 pack-year smoking history. She has never used smokeless tobacco.  Physical Assessment:   Vitals:   01/30/19 0928  BP: 121/80  Pulse: 93  Weight: 167 lb (75.8 kg)  Body mass index is 33.73 kg/m.        Physical Examination:   General appearance: Well appearing, and in no distress  Mental status: Alert, oriented to person, place, and time  Skin: Warm & dry  Cardiovascular: Normal heart rate noted  Respiratory: Normal respiratory effort, no distress  Abdomen: Soft, gravid, nontender  Pelvic: Cervical exam deferred         Extremities: Edema: Trace  Fetal Status:     Movement: Present    No results found for this or any previous visit (from the past 24 hour(s)).  Assessment & Plan:  1) Low-risk pregnancy G1P0000 at [redacted]w[redacted]d with an Estimated Date of Delivery: 05/07/19   2) Suboxone maintenance, 4mg  BID   Labs/procedures/US today: PN2 today  Plan:  Continue routine obstetrical care    Follow-up: Return in about 3 weeks (around 02/20/2019) for LROB.  Orders Placed This Encounter  Procedures  .  POC Urinalysis Dipstick OB   Jacklyn Shell CNM 01/30/2019 9:40 AM

## 2019-01-30 NOTE — Patient Instructions (Signed)
Andrea [name], I greatly value your feedback.  If you receive a survey following your visit with [place] today, we appreciate you taking the time to fill it out.  Thanks, Cathie Beams, CNM   Call the office [phone]) or go to [facility] if:  You begin to have strong, frequent contractions  Your water breaks.  Sometimes it is a big gush of fluid, sometimes it is just a trickle that keeps getting your panties wet or running down your legs  You have vaginal bleeding.  It is normal to have a small amount of spotting if your cervix was checked.   You don't feel your baby moving like normal.  If you don't, get you something to eat and drink and lay down and focus on feeling your baby move.  You should feel at least 10 movements in 2 hours.  If you don't, you should call the office or go to [facility].    Tdap Vaccine  It is recommended that you get the Tdap vaccine during the third trimester of EACH pregnancy to help protect your baby from getting pertussis (whooping cough)  27-36 weeks is the BEST time to do this so that you can pass the protection on to your baby. During pregnancy is better than after pregnancy, but if you are unable to get it during pregnancy it will be offered at the hospital.   You will be offered this vaccine in the office after 27 weeks. If you do not have health insurance, you can get this vaccine at the health department or your family doctor  Everyone who will be around your baby should also be up-to-date on their vaccines. Adults (who are not pregnant) only need 1 dose of Tdap during adulthood.   Third Trimester of Pregnancy The third trimester is from week 29 through week 42, months 7 through 9. The third trimester is a time when the fetus is growing rapidly. At the end of the ninth month, the fetus is about 20 inches in length and weighs 6-10 pounds.  BODY CHANGES Your body goes through many changes during pregnancy. The changes vary from woman  to woman.   Your weight will continue to increase. You can expect to gain 25-35 pounds (11-16 kg) by the end of the pregnancy.  You may begin to get stretch marks on your hips, abdomen, and breasts.  You may urinate more often because the fetus is moving lower into your pelvis and pressing on your bladder.  You may develop or continue to have heartburn as a result of your pregnancy.  You may develop constipation because certain hormones are causing the muscles that push waste through your intestines to slow down.  You may develop hemorrhoids or swollen, bulging veins (varicose veins).  You may have pelvic pain because of the weight gain and pregnancy hormones relaxing your joints between the bones in your pelvis. Backaches may result from overexertion of the muscles supporting your posture.  You may have changes in your hair. These can include thickening of your hair, rapid growth, and changes in texture. Some women also have hair loss during or after pregnancy, or hair that feels dry or thin. Your hair will most likely return to normal after your baby is born.  Your breasts will continue to grow and be tender. A yellow discharge may leak from your breasts called colostrum.  Your belly button may stick out.  You may feel short of breath because of your expanding uterus.  You may notice the fetus "dropping," or moving lower in your abdomen.  You may have a bloody mucus discharge. This usually occurs a few days to a week before labor begins.  Your cervix becomes thin and soft (effaced) near your due date. WHAT TO EXPECT AT YOUR PRENATAL EXAMS  You will have prenatal exams every 2 weeks until week 36. Then, you will have weekly prenatal exams. During a routine prenatal visit:  You will be weighed to make sure you and the fetus are growing normally.  Your blood pressure is taken.  Your abdomen will be measured to track your baby's growth.  The fetal heartbeat will be listened  to.  Any test results from the previous visit will be discussed.  You may have a cervical check near your due date to see if you have effaced. At around 36 weeks, your caregiver will check your cervix. At the same time, your caregiver will also perform a test on the secretions of the vaginal tissue. This test is to determine if a type of bacteria, Group B streptococcus, is present. Your caregiver will explain this further. Your caregiver may ask you:  What your birth plan is.  How you are feeling.  If you are feeling the baby move.  If you have had any abnormal symptoms, such as leaking fluid, bleeding, severe headaches, or abdominal cramping.  If you have any questions. Other tests or screenings that may be performed during your third trimester include:  Blood tests that check for low iron levels (anemia).  Fetal testing to check the health, activity level, and growth of the fetus. Testing is done if you have certain medical conditions or if there are problems during the pregnancy. FALSE LABOR You may feel small, irregular contractions that eventually go away. These are called Braxton Hicks contractions, or false labor. Contractions may last for hours, days, or even weeks before true labor sets in. If contractions come at regular intervals, intensify, or become painful, it is best to be seen by your caregiver.  SIGNS OF LABOR   Menstrual-like cramps.  Contractions that are 5 minutes apart or less.  Contractions that start on the top of the uterus and spread down to the lower abdomen and back.  A sense of increased pelvic pressure or back pain.  A watery or bloody mucus discharge that comes from the vagina. If you have any of these signs before the 37th week of pregnancy, call your caregiver right away. You need to go to the hospital to get checked immediately. HOME CARE INSTRUCTIONS   Avoid all smoking, herbs, alcohol, and unprescribed drugs. These chemicals affect the  formation and growth of the baby.  Follow your caregiver's instructions regarding medicine use. There are medicines that are either safe or unsafe to take during pregnancy.  Exercise only as directed by your caregiver. Experiencing uterine cramps is a good sign to stop exercising.  Continue to eat regular, healthy meals.  Wear a good support bra for breast tenderness.  Do not use hot tubs, steam rooms, or saunas.  Wear your seat belt at all times when driving.  Avoid raw meat, uncooked cheese, cat litter boxes, and soil used by cats. These carry germs that can cause birth defects in the baby.  Take your prenatal vitamins.  Try taking a stool softener (if your caregiver approves) if you develop constipation. Eat more high-fiber foods, such as fresh vegetables or fruit and whole grains. Drink plenty of fluids to keep your urine  clear or pale yellow.  Take warm sitz baths to soothe any pain or discomfort caused by hemorrhoids. Use hemorrhoid cream if your caregiver approves.  If you develop varicose veins, wear support hose. Elevate your feet for 15 minutes, 3-4 times a day. Limit salt in your diet.  Avoid heavy lifting, wear low heal shoes, and practice good posture.  Rest a lot with your legs elevated if you have leg cramps or low back pain.  Visit your dentist if you have not gone during your pregnancy. Use a soft toothbrush to brush your teeth and be gentle when you floss.  A sexual relationship may be continued unless your caregiver directs you otherwise.  Do not travel far distances unless it is absolutely necessary and only with the approval of your caregiver.  Take prenatal classes to understand, practice, and ask questions about the labor and delivery.  Make a trial run to the hospital.  Pack your hospital bag.  Prepare the baby's nursery.  Continue to go to all your prenatal visits as directed by your caregiver. SEEK MEDICAL CARE IF:  You are unsure if you are in  labor or if your water has broken.  You have dizziness.  You have mild pelvic cramps, pelvic pressure, or nagging pain in your abdominal area.  You have persistent nausea, vomiting, or diarrhea.  You have a bad smelling vaginal discharge.  You have pain with urination. SEEK IMMEDIATE MEDICAL CARE IF:   You have a fever.  You are leaking fluid from your vagina.  You have spotting or bleeding from your vagina.  You have severe abdominal cramping or pain.  You have rapid weight loss or gain.  You have shortness of breath with chest pain.  You notice sudden or extreme swelling of your face, hands, ankles, feet, or legs.  You have not felt your baby move in over an hour.  You have severe headaches that do not go away with medicine.  You have vision changes. Document Released: 11/21/2001 Document Revised: 12/02/2013 Document Reviewed: 01/28/2013 [facility] Patient Information 2015 [place], [place]. This information is not intended to replace advice given to you by your health care provider. Make sure you discuss any questions you have with your health care provider.

## 2019-01-31 LAB — RPR: RPR Ser Ql: NONREACTIVE

## 2019-01-31 LAB — GLUCOSE TOLERANCE, 2 HOURS W/ 1HR
GLUCOSE, FASTING: 79 mg/dL (ref 65–91)
Glucose, 1 hour: 125 mg/dL (ref 65–179)
Glucose, 2 hour: 107 mg/dL (ref 65–152)

## 2019-01-31 LAB — CBC
HEMATOCRIT: 34 % (ref 34.0–46.6)
Hemoglobin: 11.5 g/dL (ref 11.1–15.9)
MCH: 30.7 pg (ref 26.6–33.0)
MCHC: 33.8 g/dL (ref 31.5–35.7)
MCV: 91 fL (ref 79–97)
Platelets: 240 10*3/uL (ref 150–450)
RBC: 3.75 x10E6/uL — ABNORMAL LOW (ref 3.77–5.28)
RDW: 12.6 % (ref 11.7–15.4)
WBC: 13.2 10*3/uL — AB (ref 3.4–10.8)

## 2019-01-31 LAB — HIV ANTIBODY (ROUTINE TESTING W REFLEX): HIV Screen 4th Generation wRfx: NONREACTIVE

## 2019-01-31 LAB — ANTIBODY SCREEN: Antibody Screen: NEGATIVE

## 2019-02-20 ENCOUNTER — Ambulatory Visit (INDEPENDENT_AMBULATORY_CARE_PROVIDER_SITE_OTHER): Payer: Medicaid Other | Admitting: Advanced Practice Midwife

## 2019-02-20 ENCOUNTER — Other Ambulatory Visit: Payer: Self-pay

## 2019-02-20 ENCOUNTER — Encounter: Payer: Self-pay | Admitting: Advanced Practice Midwife

## 2019-02-20 VITALS — BP 125/74 | HR 101 | Wt 170.4 lb

## 2019-02-20 DIAGNOSIS — Z3403 Encounter for supervision of normal first pregnancy, third trimester: Secondary | ICD-10-CM

## 2019-02-20 DIAGNOSIS — Z1389 Encounter for screening for other disorder: Secondary | ICD-10-CM

## 2019-02-20 DIAGNOSIS — Z23 Encounter for immunization: Secondary | ICD-10-CM

## 2019-02-20 DIAGNOSIS — Z331 Pregnant state, incidental: Secondary | ICD-10-CM

## 2019-02-20 DIAGNOSIS — Z3A29 29 weeks gestation of pregnancy: Secondary | ICD-10-CM

## 2019-02-20 LAB — POCT URINALYSIS DIPSTICK OB
Glucose, UA: NEGATIVE
Ketones, UA: NEGATIVE
Leukocytes, UA: NEGATIVE
Nitrite, UA: NEGATIVE
POC,PROTEIN,UA: NEGATIVE
RBC UA: NEGATIVE

## 2019-02-20 NOTE — Progress Notes (Signed)
  G1P0000 [redacted]w[redacted]d Estimated Date of Delivery: 05/07/19  Blood pressure 125/74, pulse (!) 101, weight 170 lb 6.4 oz (77.3 kg), last menstrual period 08/16/2018.   BP weight and urine results all reviewed and noted.  Please refer to the obstetrical flow sheet for the fundal height and fetal heart rate documentation:  Patient reports good fetal movement, denies any bleeding and no rupture of membranes symptoms or regular contractions. Patient is without complaints. All questions were answered.   Physical Assessment:   Vitals:   02/20/19 1538  BP: 125/74  Pulse: (!) 101  Weight: 170 lb 6.4 oz (77.3 kg)  Body mass index is 34.42 kg/m.        Physical Examination:   General appearance: Well appearing, and in no distress  Mental status: Alert, oriented to person, place, and time  Skin: Warm & dry  Cardiovascular: Normal heart rate noted  Respiratory: Normal respiratory effort, no distress  Abdomen: Soft, gravid, nontender  Pelvic: Cervical exam deferred         Extremities: Edema: Trace  Fetal Status: Fetal Heart Rate (bpm): 149 Fundal Height: 30 cm Movement: Present    Results for orders placed or performed in visit on 02/20/19 (from the past 24 hour(s))  POC Urinalysis Dipstick OB   Collection Time: 02/20/19  3:45 PM  Result Value Ref Range   Color, UA     Clarity, UA     Glucose, UA Negative Negative   Bilirubin, UA     Ketones, UA neg    Spec Grav, UA     Blood, UA neg    pH, UA     POC,PROTEIN,UA Negative Negative, Trace, Small (1+), Moderate (2+), Large (3+), 4+   Urobilinogen, UA     Nitrite, UA neg    Leukocytes, UA Negative Negative   Appearance     Odor       Orders Placed This Encounter  Procedures  . Tdap vaccine greater than or equal to 7yo IM  . POC Urinalysis Dipstick OB    Plan:  Continued routine obstetrical care, continue suboxone 4 mg BID   Return in about 3 weeks (around 03/13/2019) for LROB.

## 2019-02-20 NOTE — Patient Instructions (Addendum)
BENEFITS OF BREASTFEEDING Many women wonder if they should breastfeed. Research shows that breast milk contains the perfect balance of vitamins, protein and fat that your baby needs to grow. It also contains antibodies that help your baby's immune system to fight off viruses and bacteria and can reduce the risk of sudden infant death syndrome (SIDS). In addition, the colostrum (a fluid secreted from the breast in the first few days after delivery) helps your newborn's digestive system to grow and function well. Breast milk is easier to digest than formula. Also, if your baby is born preterm, breast milk can help to reduce both short- and long-term health problems. BENEFITS OF BREASTFEEDING FOR MOM . Breastfeeding causes a hormone to be released that helps the uterus to contract and return to its normal size more quickly. . It aids in postpartum weight loss, reduces risk of breast and ovarian cancer, heart disease and rheumatoid arthritis. . It decreases the amount of bleeding after the baby is born. benefits of breastfeeding for baby . Provides comfort and nutrition . Protects baby against - Obesity - Diabetes - Asthma - Childhood cancers - Heart disease - Ear infections - Diarrhea - Pneumonia - Stomach problems - Serious allergies - Skin rashes . Promotes growth and development . Reduces the risk of baby having Sudden Infant Death Syndrome (SIDS) only breastmilk for the first 6 months . Protects baby against diseases/allergies . It's the perfect amount for tiny bellies . It restores baby's energy . Provides the best nutrition for baby . Giving water or formula can make baby more likely to get sick, decrease Mom's milk supply, make baby less content with breastfeeding Skin to Skin After delivery, the staff will place your baby on your chest. This helps with the following: . Regulates baby's temperature, breathing, heart rate and blood sugar . Increases Mom's milk supply . Promotes  bonding . Keeps baby and Mom calm and decreases baby's crying Rooming In Your baby will stay in your room with you for the entire time you are in the hospital. This helps with the following: . Allows Mom to learn baby's feeding cues - Fluttering eyes - Sucking on tongue or hand - Rooting (opens mouth and turns head) - Nuzzling into the breast - Bringing hand to mouth . Allows breastfeeding on demand (when your baby is ready) . Helps baby to be calm and content . Ensures a good milk supply . Prevents complications with breastfeeding . Allows parents to learn to care for baby . Allows you to request assistance with breastfeeding Importance of a good latch . Increases milk transfer to baby - baby gets enough milk . Ensures you have enough milk for your baby . Decreases nipple soreness . Don't use pacifiers and bottles - these cause baby to suck differently than breastfeeding . Promotes continuation of breastfeeding Risks of Formula Supplementation with Breastfeeding Giving your infant formula in addition to your breast-milk EXCEPT when medically necessary can lead to: . Decreases your milk supply  . Loss of confidence in yourself for providing baby's nutrition  . Engorgement and possibly mastitis  . Asthma & allergies in the baby BREASTFEEDING FAQS How long should I breastfeed my baby? It is recommended that you provide your baby with breast milk only for the first 6 months and then continue for the first year and longer as desired. During the first few weeks after birth, your baby will need to feed 8-12 times every 24 hours, or every 2-3 hours. They will likely feed   for 15-30 minutes. How can I help my baby begin breastfeeding? Babies are born with an instinct to breastfeed. A healthy baby can begin breastfeeding right away without specific help. At the hospital, a nurse (or lactation consultant) will help you begin the process and will give you tips on good positioning. It may be  helpful to take a breastfeeding class before you deliver in order to know what to expect. How can I help my baby latch on? In order to assist your baby in latching-on, cup your breast in your hand and stroke your baby's lower lip with your nipple to stimulate your baby's rooting reflex. Your baby will look like he or she is yawning, at which point you should bring the baby towards your breast, while aiming the nipple at the roof of his or her mouth. Remember to bring the baby towards you and not your breast towards the baby. How can I tell if my baby is latched-on? Your baby will have all of your nipple and part of the dark area around the nipple in his or her mouth and your baby's nose will be touching your breast. You should see or hear the baby swallowing. If the baby is not latched-on properly, start the process over. To remove the suction, insert a clean finger between your breast and the baby's mouth. Should I switch breasts during feeding? After feeding on one side, switch the baby to your other breast. If he or she does not continue feeding - that is OK. Your baby will not necessarily need to feed from both breasts in a single feeding. On the next feeding, start with the other breast for efficiency and comfort. How can I tell if my baby is hungry? When your baby is hungry, they will nuzzle against your breast, make sucking noises and tongue motions and may put their hands near their mouth. Crying is a late sign of hunger, so you should not wait until this point. When they have received enough milk, they will unlatch from the breast. Is it okay to use a pacifier? Until your baby gets the hang of breastfeeding, experts recommend limiting pacifier usage. If you have questions about this, please contact your pediatrician. What can I do to ensure proper nutrition while breastfeeding? . Make sure that you support your own health and your baby's by eating a healthy, well-balanced diet . Your provider  may recommend that you continue to take your prenatal vitamin . Drink plenty of fluids. It is a good rule to drink one glass of water before or after feeding . Alcohol will remain in the breast milk for as long as it will remain in the blood stream. If you choose to have a drink, it is recommended that you wait at least 2 hours before feeding . Moderate amounts of caffeine are OK . Some over-the-counter or prescription medications are not recommended during breastfeeding. Check with your provider if you have questions What types of birth control methods are safe while breastfeeding? Progestin-only methods, including a daily pill, an IUD, the implant and the injection are safe while breastfeeding. Methods that contain estrogen (such as combination birth control pills, the vaginal ring and the patch) should not be used during the first month of breastfeeding as these can decrease your milk supply.  Carpal Tunnel Syndrome  Carpal tunnel syndrome is a condition that causes pain in your hand and arm. The carpal tunnel is a narrow area located on the palm side of your wrist. Repeated  wrist motion or certain diseases may cause swelling within the tunnel. This swelling pinches the main nerve in the wrist (median nerve). What are the causes? This condition may be caused by:  Repeated wrist motions.  Wrist injuries.  Arthritis.  A cyst or tumor in the carpal tunnel.  Fluid buildup during pregnancy. Sometimes the cause of this condition is not known. What increases the risk? The following factors may make you more likely to develop this condition:  Having a job, such as being a Haematologist, that requires you to repeatedly move your wrist in the same motion.  Being a woman.  Having certain conditions, such as: ? Diabetes. ? Obesity. ? An underactive thyroid (hypothyroidism). ? Kidney failure. What are the signs or symptoms? Symptoms of this condition include:  A tingling feeling  in your fingers, especially in your thumb, index, and middle fingers.  Tingling or numbness in your hand.  An aching feeling in your entire arm, especially when your wrist and elbow are bent for a long time.  Wrist pain that goes up your arm to your shoulder.  Pain that goes down into your palm or fingers.  A weak feeling in your hands. You may have trouble grabbing and holding items. Your symptoms may feel worse during the night. How is this diagnosed? This condition is diagnosed with a medical history and physical exam. You may also have tests, including:  Electromyogram (EMG). This test measures electrical signals sent by your nerves into the muscles.  Nerve conduction study. This test measures how well electrical signals pass through your nerves.  Imaging tests, such as X-rays, ultrasound, and MRI. These tests check for possible causes of your condition. How is this treated? This condition may be treated with:  Lifestyle changes. It is important to stop or change the activity that caused your condition.  Doing exercise and activities to strengthen your muscles and bones (physical therapy).  Learning how to use your hand again after diagnosis (occupational therapy).  Medicines for pain and inflammation. This may include medicine that is injected into your wrist.  A wrist splint.  Surgery. Follow these instructions at home: If you have a splint:  Wear the splint as told by your health care provider. Remove it only as told by your health care provider.  Loosen the splint if your fingers tingle, become numb, or turn cold and blue.  Keep the splint clean.  If the splint is not waterproof: ? Do not let it get wet. ? Cover it with a watertight covering when you take a bath or shower. Managing pain, stiffness, and swelling   If directed, put ice on the painful area: ? If you have a removable splint, remove it as told by your health care provider. ? Put ice in a plastic  bag. ? Place a towel between your skin and the bag. ? Leave the ice on for 20 minutes, 2-3 times per day. General instructions  Take over-the-counter and prescription medicines only as told by your health care provider.  Rest your wrist from any activity that may be causing your pain. If your condition is work related, talk with your employer about changes that can be made, such as getting a wrist pad to use while typing.  Do any exercises as told by your health care provider, physical therapist, or occupational therapist.  Keep all follow-up visits as told by your health care provider. This is important. Contact a health care provider if:  You  have new symptoms.  Your pain is not controlled with medicines.  Your symptoms get worse. Get help right away if:  You have severe numbness or tingling in your wrist or hand. Summary  Carpal tunnel syndrome is a condition that causes pain in your hand and arm.  It is usually caused by repeated wrist motions.  Lifestyle changes and medicines are used to treat carpal tunnel syndrome. Surgery may be recommended.  Follow your health care provider's instructions about wearing a splint, resting from activity, keeping follow-up visits, and calling for help. This information is not intended to replace advice given to you by your health care provider. Make sure you discuss any questions you have with your health care provider. Document Released: 11/24/2000 Document Revised: 04/05/2018 Document Reviewed: 04/05/2018 Elsevier Interactive Patient Education  2019 [employer].

## 2019-03-13 ENCOUNTER — Ambulatory Visit (INDEPENDENT_AMBULATORY_CARE_PROVIDER_SITE_OTHER): Payer: Medicaid Other | Admitting: Women's Health

## 2019-03-13 ENCOUNTER — Encounter: Payer: Self-pay | Admitting: Women's Health

## 2019-03-13 ENCOUNTER — Other Ambulatory Visit: Payer: Self-pay

## 2019-03-13 VITALS — BP 130/91 | Wt 169.0 lb

## 2019-03-13 DIAGNOSIS — Z3A32 32 weeks gestation of pregnancy: Secondary | ICD-10-CM

## 2019-03-13 DIAGNOSIS — Z3403 Encounter for supervision of normal first pregnancy, third trimester: Secondary | ICD-10-CM

## 2019-03-13 NOTE — Progress Notes (Signed)
   TELEHEALTH VIRTUAL OBSTETRICS VISIT ENCOUNTER NOTE  I connected with Andrea [name] on 03/13/19 at  2:00 PM EDT by telephone at home and verified that I am speaking with the correct person using two identifiers.   I discussed the limitations, risks, security and privacy concerns of performing an evaluation and management service by telephone and the availability of in person appointments. I also discussed with the patient that there may be a patient responsible charge related to this service. The patient expressed understanding and agreed to proceed.  Subjective:  Andrea [name] is a 23 y.o. G1P0000 at [redacted]w[redacted]d being followed for ongoing prenatal care.  She is currently monitored for the following issues for this low-risk pregnancy and has Abnormal Pap smear of cervix; Subchorionic hemorrhage in first trimester; Supervision of normal first pregnancy; Smoker; Depression with anxiety; H/O opiate addiction; and Long term prescription benzodiazepine use on their problem list.  Patient reports no complaints. Subutex 2mg  QID (1/4 an 8mg  pill QID), sees Dr. Cathey Endow. Denies ha, visual changes, ruq/epigastric pain, n/v.  Reports fetal movement. Denies any contractions, bleeding or leaking of fluid.   The following portions of the patient's history were reviewed and updated as appropriate: allergies, current medications, past family history, past medical history, past social history, past surgical history and problem list.   Objective:   General:  Alert, oriented and cooperative.   Mental Status: Normal mood and affect perceived. Normal judgment and thought content.  Rest of physical exam deferred due to type of encounter BP (!) 130/91   Wt 169 lb (76.7 kg)   LMP 08/16/2018 (Approximate)   BMI 34.13 kg/m   Assessment and Plan:  Pregnancy: G1P0000 at 104w1d  BP slightly elevated> asymptomatic, will bring in tomorrow for bp check w/ RN (pt doesn't have bp cuff- took bp today at subutex clinic).  Reviewed pre-e s/s, reasons to seek care  Send AVS info on mychart message  Preterm labor symptoms and general obstetric precautions including but not limited to vaginal bleeding, contractions, leaking of fluid and fetal movement were reviewed in detail with the patient.  I discussed the assessment and treatment plan with the patient. The patient was provided an opportunity to ask questions and all were answered. The patient agreed with the plan and demonstrated an understanding of the instructions. The patient was advised to call back or seek an in-person office evaluation/go to [facility] at [facility] for any urgent or concerning symptoms. Please refer to After Visit Summary for other counseling recommendations.   I provided 12 minutes of non-face-to-face time during this encounter.  Return in about 1 day (around 03/14/2019) for bp w/ nurse .  Future Appointments  Date Time Provider Department Center  03/14/2019 10:00 AM [facility] NURSE TECH [facility]    Cheral Marker, CNM [facility] [employer], [facility]

## 2019-03-14 ENCOUNTER — Ambulatory Visit: Payer: Medicaid Other

## 2019-03-14 VITALS — BP 125/84 | Temp 98.1°F | Ht 59.0 in | Wt 176.0 lb

## 2019-03-14 DIAGNOSIS — Z1389 Encounter for screening for other disorder: Secondary | ICD-10-CM

## 2019-03-14 DIAGNOSIS — Z0130 Encounter for examination of blood pressure without abnormal findings: Secondary | ICD-10-CM

## 2019-03-14 DIAGNOSIS — Z331 Pregnant state, incidental: Secondary | ICD-10-CM

## 2019-03-14 LAB — POCT URINALYSIS DIPSTICK OB
Blood, UA: NEGATIVE
Glucose, UA: NEGATIVE
Ketones, UA: NEGATIVE
Leukocytes, UA: NEGATIVE
Nitrite, UA: NEGATIVE

## 2019-03-14 NOTE — Progress Notes (Signed)
Pt here for B/P check 125/84,spoke with kim booker about reading.Advise if B/P reading greater than 140 on top and bottom is greater than 90, call office.sent home with B/P cuff from office. Record reading.Tele-visit in 2 weeks. Pad CMA

## 2019-03-26 ENCOUNTER — Encounter: Payer: Self-pay | Admitting: *Deleted

## 2019-03-28 ENCOUNTER — Other Ambulatory Visit: Payer: Self-pay

## 2019-03-28 ENCOUNTER — Encounter: Payer: Self-pay | Admitting: Women's Health

## 2019-03-28 ENCOUNTER — Ambulatory Visit (INDEPENDENT_AMBULATORY_CARE_PROVIDER_SITE_OTHER): Payer: Medicaid Other | Admitting: Women's Health

## 2019-03-28 VITALS — BP 134/86 | HR 94 | Wt 178.0 lb

## 2019-03-28 DIAGNOSIS — O99323 Drug use complicating pregnancy, third trimester: Secondary | ICD-10-CM

## 2019-03-28 DIAGNOSIS — Z3403 Encounter for supervision of normal first pregnancy, third trimester: Secondary | ICD-10-CM

## 2019-03-28 DIAGNOSIS — Z3A34 34 weeks gestation of pregnancy: Secondary | ICD-10-CM

## 2019-03-28 DIAGNOSIS — O99320 Drug use complicating pregnancy, unspecified trimester: Secondary | ICD-10-CM

## 2019-03-28 DIAGNOSIS — F1120 Opioid dependence, uncomplicated: Secondary | ICD-10-CM | POA: Diagnosis not present

## 2019-03-28 NOTE — Patient Instructions (Signed)
Andrea [name], I greatly value your feedback.  If you receive a survey following your visit with us today, we appreciate you taking the time to fill it out.  Thanks, Joellyn HaffKim Arjay Jaskiewicz, CNM, [facility]-BC  [facility]!!! It is now [facility] ([address] [place], [place] [address]) Entrance located off of [address] Free 24/7 valet parking    Call the office [phone]) or go to [facility] if:  You begin to have strong, frequent contractions  Your water breaks.  Sometimes it is a big gush of fluid, sometimes it is just a trickle that keeps getting your panties wet or running down your legs  You have vaginal bleeding.  It is normal to have a small amount of spotting if your cervix was checked.   You don't feel your baby moving like normal.  If you don't, get you something to eat and drink and lay down and focus on feeling your baby move.  You should feel at least 10 movements in 2 hours.  If you don't, you should call the office or go to [facility].    Call the office [phone]) or go to [facility] for these signs of pre-eclampsia:  Severe headache that does not go away with Tylenol  Visual changes- seeing spots, double, blurred vision  Pain under your right breast or upper abdomen that does not go away with Tums or heartburn medicine  Nausea and/or vomiting  Severe swelling in your hands, feet, and face     Home Blood Pressure Monitoring for Patients   Your provider has recommended that you check your blood pressure (BP) at least once a week at home. If you do not have a blood pressure cuff at home, one will be provided for you. Contact your provider if you have not received your monitor within 1 week.   Helpful Tips for Accurate Home Blood Pressure Checks  . Don't smoke, exercise, or drink caffeine 30 minutes before checking your BP . Use the restroom before checking your BP (a full bladder can raise your pressure) .  Relax in a comfortable upright chair . Feet on the ground . Left arm resting comfortably on a flat surface at the level of your heart . Legs uncrossed . Back supported . Sit quietly and don't talk . Place the cuff on your bare arm . Adjust snuggly, so that only two fingertips can fit between your skin and the top of the cuff . Check 2 readings separated by at least one minute . Keep a log of your BP readings . For a visual, please reference this diagram: http://ccnc.care/bpdiagram  Provider Name: [facility]     Phone: [phone]  Zone 1: ALL CLEAR  Continue to monitor your symptoms:  . BP reading is less than 140 (top number) or less than 90 (bottom number)  . No right upper stomach pain . No headaches or seeing spots . No feeling nauseated or throwing up . No swelling in face and hands  Zone 2: CAUTION Call your doctor's office for any of the following:  . BP reading is greater than 140 (top number) or greater than 90 (bottom number)  . Stomach pain under your ribs in the middle or right side . Headaches or seeing spots . Feeling nauseated or throwing up . Swelling in face and hands  Zone 3: EMERGENCY  Seek immediate medical care if you have any of the following:  .  BP reading is greater than160 (top number) or greater than 110 (bottom number) . Severe headaches not improving with Tylenol . Serious difficulty catching your breath . Any worsening symptoms from Zone 2   Preterm Labor and Birth Information  The normal length of a pregnancy is 39-41 weeks. Preterm labor is when labor starts before 37 completed weeks of pregnancy. What are the risk factors for preterm labor? Preterm labor is more likely to occur in women who:  Have certain infections during pregnancy such as a bladder infection, sexually transmitted infection, or infection inside the uterus (chorioamnionitis).  Have a shorter-than-normal cervix.  Have gone into preterm labor before.  Have had  surgery on their cervix.  Are younger than age 57 or older than age 16.  Are African American.  Are pregnant with twins or multiple babies (multiple gestation).  Take street drugs or smoke while pregnant.  Do not gain enough weight while pregnant.  Became pregnant shortly after having been pregnant. What are the symptoms of preterm labor? Symptoms of preterm labor include:  Cramps similar to those that can happen during a menstrual period. The cramps may happen with diarrhea.  Pain in the abdomen or lower back.  Regular uterine contractions that may feel like tightening of the abdomen.  A feeling of increased pressure in the pelvis.  Increased watery or bloody mucus discharge from the vagina.  Water breaking (ruptured amniotic sac). Why is it important to recognize signs of preterm labor? It is important to recognize signs of preterm labor because babies who are born prematurely may not be fully developed. This can put them at an increased risk for:  Long-term (chronic) heart and lung problems.  Difficulty immediately after birth with regulating body systems, including blood sugar, body temperature, heart rate, and breathing rate.  Bleeding in the brain.  Cerebral palsy.  Learning difficulties.  Death. These risks are highest for babies who are born before 34 weeks of pregnancy. How is preterm labor treated? Treatment depends on the length of your pregnancy, your condition, and the health of your baby. It may involve:  Having a stitch (suture) placed in your cervix to prevent your cervix from opening too early (cerclage).  Taking or being given medicines, such as: ? Hormone medicines. These may be given early in pregnancy to help support the pregnancy. ? Medicine to stop contractions. ? Medicines to help mature the baby's lungs. These may be prescribed if the risk of delivery is high. ? Medicines to prevent your baby from developing cerebral palsy. If the labor  happens before 34 weeks of pregnancy, you may need to stay in the hospital. What should I do if I think I am in preterm labor? If you think that you are going into preterm labor, call your health care provider right away. How can I prevent preterm labor in future pregnancies? To increase your chance of having a full-term pregnancy:  Do not use any tobacco products, such as cigarettes, chewing tobacco, and e-cigarettes. If you need help quitting, ask your health care provider.  Do not use street drugs or medicines that have not been prescribed to you during your pregnancy.  Talk with your health care provider before taking any herbal supplements, even if you have been taking them regularly.  Make sure you gain a healthy amount of weight during your pregnancy.  Watch for infection. If you think that you might have an infection, get it checked right away.  Make sure to tell your health  care provider if you have gone into preterm labor before. This information is not intended to replace advice given to you by your health care provider. Make sure you discuss any questions you have with your health care provider. Document Released: 02/17/2004 Document Revised: 05/09/2016 Document Reviewed: 04/19/2016 Elsevier Interactive Patient Education  2019 [employer].

## 2019-03-28 NOTE — Progress Notes (Signed)
   TELEHEALTH VIRTUAL OBSTETRICS VISIT ENCOUNTER NOTE Patient name: Andrea [name] MRN [id number]  Date of birth: 1996-03-24  I connected with patient on 03/28/19 at  1:00 PM EDT by telephone and verified that I am speaking with the correct person using two identifiers. Due to COVID-19 recommendations, pt is not currently in our office.    I discussed the limitations, risks, security and privacy concerns of performing an evaluation and management service by telephone and the availability of in person appointments. I also discussed with the patient that there may be a patient responsible charge related to this service. The patient expressed understanding and agreed to proceed.  Chief Complaint:   Routine Prenatal Visit  History of Present Illness:   Andrea [name] is a 23 y.o. G1P0000 female at [redacted]w[redacted]d with an Estimated Date of Delivery: 05/07/19 being evaluated today for ongoing management of a low-risk pregnancy.  Today she reports no complaints. Checking home bp's all <140/90. Dr. Cathey Endow increased her subutex from 2mg  QID to 4mg  QID yesterday d/t increased pain/cravings. Contractions: Irregular. Vag. Bleeding: None.  Movement: Present. denies leaking of fluid. Review of Systems:   Pertinent items are noted in HPI Denies abnormal vaginal discharge w/ itching/odor/irritation, headaches, visual changes, shortness of breath, chest pain, abdominal pain, severe nausea/vomiting, or problems with urination or bowel movements unless otherwise stated above. Pertinent History Reviewed:  Reviewed past medical,surgical, social, obstetrical and family history.  Reviewed problem list, medications and allergies. Physical Assessment:   Vitals:   03/28/19 1306  BP: 134/86  Pulse: 94  Weight: 178 lb (80.7 kg)  Body mass index is 35.95 kg/m.        Physical Examination:   General:  Alert, oriented and cooperative.   Mental Status: Normal mood and affect perceived. Normal judgment and thought content.   Rest of physical exam deferred due to type of encounter  No results found for this or any previous visit (from the past 24 hour(s)).  Assessment & Plan:  1) Pregnancy G1P0000 at [redacted]w[redacted]d with an Estimated Date of Delivery: 05/07/19   2) Subutex therapy, increased yesterday to 4mg  QID  3) BP high-normal> check daily, let us know if >140/90, reviewed pre-e s/s, f/u 1wk  Meds: No orders of the defined types were placed in this encounter.  Labs/procedures today: none  Plan:  Continue routine obstetrical care. Has home BP cuff.   Reviewed: Preterm labor symptoms and general obstetric precautions including but not limited to vaginal bleeding, contractions, leaking of fluid and fetal movement were reviewed in detail with the patient. The patient was advised to call back or seek an in-person office evaluation/go to MAU at [facility] for any urgent or concerning symptoms. All questions were answered. Please refer to After Visit Summary for other counseling recommendations.   I provided 10 minutes of non-face-to-face time during this encounter.  Follow-up: Return in about 1 week (around 04/04/2019) for LROB televisit.  No orders of the defined types were placed in this encounter.  Cheral Marker CNM, [facility] 03/28/2019 1:30 PM

## 2019-04-03 ENCOUNTER — Ambulatory Visit (INDEPENDENT_AMBULATORY_CARE_PROVIDER_SITE_OTHER): Payer: Medicaid Other | Admitting: Obstetrics & Gynecology

## 2019-04-03 ENCOUNTER — Other Ambulatory Visit: Payer: Self-pay

## 2019-04-03 VITALS — BP 133/86 | HR 127 | Wt 176.0 lb

## 2019-04-03 DIAGNOSIS — F1121 Opioid dependence, in remission: Secondary | ICD-10-CM | POA: Diagnosis not present

## 2019-04-03 DIAGNOSIS — Z3403 Encounter for supervision of normal first pregnancy, third trimester: Secondary | ICD-10-CM

## 2019-04-03 DIAGNOSIS — Z3A35 35 weeks gestation of pregnancy: Secondary | ICD-10-CM | POA: Diagnosis not present

## 2019-04-03 NOTE — Progress Notes (Signed)
   TELEHEALTH VIRTUAL OBSTETRICS VISIT ENCOUNTER NOTE  I connected with Andrea [name] on 04/03/19 at  4:15 PM EDT by telephone at home and verified that I am speaking with the correct person using two identifiers.   I discussed the limitations, risks, security and privacy concerns of performing an evaluation and management service by telephone and the availability of in person appointments. I also discussed with the patient that there may be a patient responsible charge related to this service. The patient expressed understanding and agreed to proceed.  Subjective:  Andrea [name] is a 23 y.o. G1P0000 at [redacted]w[redacted]d being followed for ongoing prenatal care.  She is currently monitored for the following issues for this low-risk pregnancy and has Abnormal Pap smear of cervix; Subchorionic hemorrhage in first trimester; Supervision of normal first pregnancy; Smoker; Depression with anxiety; H/O opiate addiction; and Long term prescription benzodiazepine use on their problem list.  Taking subutex 4 mg QID, is taking about 12 mg per day Patient reports no complaints. Reports fetal movement. Denies any contractions, bleeding or leaking of fluid.   The following portions of the patient's history were reviewed and updated as appropriate: allergies, current medications, past family history, past medical history, past social history, past surgical history and problem list.   Objective:   General:  Alert, oriented and cooperative.   Mental Status: Normal mood and affect perceived. Normal judgment and thought content.  Rest of physical exam deferred due to type of encounter  Assessment and Plan:  Pregnancy: G1P0000 at [redacted]w[redacted]d There are no diagnoses linked to this encounter. Preterm labor symptoms and general obstetric precautions including but not limited to vaginal bleeding, contractions, leaking of fluid and fetal movement were reviewed in detail with the patient.  I discussed the assessment and  treatment plan with the patient. The patient was provided an opportunity to ask questions and all were answered. The patient agreed with the plan and demonstrated an understanding of the instructions. The patient was advised to call back or seek an in-person office evaluation/go to MAU at [facility] for any urgent or concerning symptoms. Please refer to After Visit Summary for other counseling recommendations.   I provided 15 minutes of non-face-to-face time during this encounter.  Return in about 11 days (around 04/14/2019) for sonogram for EFW, LROB.  No future appointments.  Lazaro Arms, MD [facility] [employer], [facility]

## 2019-04-04 ENCOUNTER — Telehealth: Payer: Self-pay | Admitting: Obstetrics & Gynecology

## 2019-04-04 NOTE — Telephone Encounter (Signed)
Ob pt 35 weeks would like to have a nurse give her a call

## 2019-04-04 NOTE — Telephone Encounter (Signed)
Called patient back. She states that she has some numbness on one side of her tongue. No swelling and no new meds. Advised patient to montior her symptoms and if they dont' improve to let us know. Agreeable and no other questions at this time.

## 2019-04-10 ENCOUNTER — Encounter: Payer: Self-pay | Admitting: *Deleted

## 2019-04-14 ENCOUNTER — Encounter: Payer: Self-pay | Admitting: *Deleted

## 2019-04-15 ENCOUNTER — Ambulatory Visit (INDEPENDENT_AMBULATORY_CARE_PROVIDER_SITE_OTHER): Payer: Medicaid Other | Admitting: Obstetrics & Gynecology

## 2019-04-15 ENCOUNTER — Other Ambulatory Visit: Payer: Self-pay

## 2019-04-15 ENCOUNTER — Encounter: Payer: Self-pay | Admitting: Obstetrics & Gynecology

## 2019-04-15 ENCOUNTER — Ambulatory Visit (INDEPENDENT_AMBULATORY_CARE_PROVIDER_SITE_OTHER): Payer: Medicaid Other

## 2019-04-15 VITALS — BP 128/88 | HR 98 | Wt 181.0 lb

## 2019-04-15 DIAGNOSIS — Z1389 Encounter for screening for other disorder: Secondary | ICD-10-CM

## 2019-04-15 DIAGNOSIS — Z3A36 36 weeks gestation of pregnancy: Secondary | ICD-10-CM | POA: Diagnosis not present

## 2019-04-15 DIAGNOSIS — Z362 Encounter for other antenatal screening follow-up: Secondary | ICD-10-CM

## 2019-04-15 DIAGNOSIS — Z331 Pregnant state, incidental: Secondary | ICD-10-CM

## 2019-04-15 DIAGNOSIS — Z3403 Encounter for supervision of normal first pregnancy, third trimester: Secondary | ICD-10-CM | POA: Diagnosis not present

## 2019-04-15 DIAGNOSIS — F17200 Nicotine dependence, unspecified, uncomplicated: Secondary | ICD-10-CM

## 2019-04-15 DIAGNOSIS — F1121 Opioid dependence, in remission: Secondary | ICD-10-CM

## 2019-04-15 LAB — POCT URINALYSIS DIPSTICK OB
Glucose, UA: NEGATIVE
Ketones, UA: NEGATIVE
Nitrite, UA: NEGATIVE

## 2019-04-15 NOTE — Progress Notes (Signed)
[place] 36+6 wks,cephalic,fhr 154 bpm,posterior placenta gr 3,normal ovaries bilateral,afi 19 cm,EFW 2745 g 26%

## 2019-04-15 NOTE — Progress Notes (Signed)
   LOW-RISK PREGNANCY VISIT Patient name: Andrea [name] MRN [id number]  Date of birth: 09/03/1996 Chief Complaint:   Routine Prenatal Visit ([place]; GBS; GC/CHL)  History of Present Illness:   Andrea [name] is a 23 y.o. G1P0000 female at [redacted]w[redacted]d with an Estimated Date of Delivery: 05/07/19 being seen today for ongoing management of a low-risk pregnancy.  Today she reports no complaints. Contractions: Irregular. Vag. Bleeding: None.  Movement: Present. denies leaking of fluid. Review of Systems:   Pertinent items are noted in HPI Denies abnormal vaginal discharge w/ itching/odor/irritation, headaches, visual changes, shortness of breath, chest pain, abdominal pain, severe nausea/vomiting, or problems with urination or bowel movements unless otherwise stated above. Pertinent History Reviewed:  Reviewed past medical,surgical, social, obstetrical and family history.  Reviewed problem list, medications and allergies. Physical Assessment:   Vitals:   04/15/19 1144 04/15/19 1148  BP: (!) 135/94 128/88  Pulse: 96 98  Weight: 181 lb (82.1 kg)   Body mass index is 36.56 kg/m.        Physical Examination:   General appearance: Well appearing, and in no distress  Mental status: Alert, oriented to person, place, and time  Skin: Warm & dry  Cardiovascular: Normal heart rate noted  Respiratory: Normal respiratory effort, no distress  Abdomen: Soft, gravid, nontender  Pelvic: Cervical exam performed       2/20/-2  Extremities: Edema: Trace  Fetal Status:     Movement: Present    Results for orders placed or performed in visit on 04/15/19 (from the past 24 hour(s))  POC Urinalysis Dipstick OB   Collection Time: 04/15/19 11:35 AM  Result Value Ref Range   Color, UA     Clarity, UA     Glucose, UA Negative Negative   Bilirubin, UA     Ketones, UA neg    Spec Grav, UA     Blood, UA trace    pH, UA     POC,PROTEIN,UA Trace Negative, Trace, Small (1+), Moderate (2+), Large (3+), 4+   Urobilinogen, UA     Nitrite, UA neg    Leukocytes, UA Trace (A) Negative   Appearance     Odor      Assessment & Plan:  1) Low-risk pregnancy G1P0000 at [redacted]w[redacted]d with an Estimated Date of Delivery: 05/07/19   2) Opioid dependence on subutex  3) borderline BP, short interval follow up, at home is staying 130s/80s for the most part   Meds: No orders of the defined types were placed in this encounter.  Labs/procedures today: cultures  Plan:  Continue routine obstetrical care   Reviewed: Term labor symptoms and general obstetric precautions including but not limited to vaginal bleeding, contractions, leaking of fluid and fetal movement were reviewed in detail with the patient.  All questions were answered  Follow-up: Return in about 3 days (around 04/18/2019) for BP check.  Orders Placed This Encounter  Procedures  . GC/Chlamydia Probe Amp  . Strep Gp B NAA  . POC Urinalysis Dipstick OB   Andrea [name]  04/15/2019 12:16 PM

## 2019-04-17 LAB — STREP GP B NAA: Strep Gp B NAA: NEGATIVE

## 2019-04-18 ENCOUNTER — Other Ambulatory Visit: Payer: Self-pay

## 2019-04-18 ENCOUNTER — Ambulatory Visit (INDEPENDENT_AMBULATORY_CARE_PROVIDER_SITE_OTHER): Payer: Medicaid Other | Admitting: *Deleted

## 2019-04-18 ENCOUNTER — Encounter: Payer: Self-pay | Admitting: *Deleted

## 2019-04-18 VITALS — BP 123/80 | HR 106

## 2019-04-18 DIAGNOSIS — Z0130 Encounter for examination of blood pressure without abnormal findings: Secondary | ICD-10-CM

## 2019-04-18 DIAGNOSIS — Z1389 Encounter for screening for other disorder: Secondary | ICD-10-CM

## 2019-04-18 DIAGNOSIS — Z331 Pregnant state, incidental: Secondary | ICD-10-CM

## 2019-04-18 LAB — POCT URINALYSIS DIPSTICK OB
Blood, UA: NEGATIVE
Glucose, UA: NEGATIVE
Ketones, UA: NEGATIVE
Leukocytes, UA: NEGATIVE
Nitrite, UA: NEGATIVE

## 2019-04-18 NOTE — Progress Notes (Signed)
BP normal, no HA, vision changes

## 2019-04-21 ENCOUNTER — Other Ambulatory Visit: Payer: Self-pay

## 2019-04-21 ENCOUNTER — Ambulatory Visit (INDEPENDENT_AMBULATORY_CARE_PROVIDER_SITE_OTHER): Payer: Medicaid Other | Admitting: Obstetrics & Gynecology

## 2019-04-21 VITALS — BP 142/96 | HR 120 | Wt 184.0 lb

## 2019-04-21 DIAGNOSIS — Z3A37 37 weeks gestation of pregnancy: Secondary | ICD-10-CM

## 2019-04-21 DIAGNOSIS — O0993 Supervision of high risk pregnancy, unspecified, third trimester: Secondary | ICD-10-CM

## 2019-04-21 DIAGNOSIS — Z1389 Encounter for screening for other disorder: Secondary | ICD-10-CM

## 2019-04-21 DIAGNOSIS — Z331 Pregnant state, incidental: Secondary | ICD-10-CM

## 2019-04-21 DIAGNOSIS — O133 Gestational [pregnancy-induced] hypertension without significant proteinuria, third trimester: Secondary | ICD-10-CM

## 2019-04-21 LAB — POCT URINALYSIS DIPSTICK OB
Blood, UA: NEGATIVE
Glucose, UA: NEGATIVE
Ketones, UA: NEGATIVE
Leukocytes, UA: NEGATIVE
Nitrite, UA: NEGATIVE
POC,PROTEIN,UA: NEGATIVE

## 2019-04-21 NOTE — Progress Notes (Signed)
   HIGH-RISK PREGNANCY VISIT Patient name: Andrea [name] MRN [id number]  Date of birth: 1995-12-30 Chief Complaint:   Routine Prenatal Visit  History of Present Illness:   Andrea [name] is a 23 y.o. G1P0000 female at [redacted]w[redacted]d with an Estimated Date of Delivery: 05/07/19 being seen today for ongoing management of a high-risk pregnancy complicated by gestational HTN.  Today she reports no complaints. Contractions: Irritability. Vag. Bleeding: None.  Movement: Present. denies leaking of fluid.  Review of Systems:   Pertinent items are noted in HPI Denies abnormal vaginal discharge w/ itching/odor/irritation, headaches, visual changes, shortness of breath, chest pain, abdominal pain, severe nausea/vomiting, or problems with urination or bowel movements unless otherwise stated above. Pertinent History Reviewed:  Reviewed past medical,surgical, social, obstetrical and family history.  Reviewed problem list, medications and allergies. Physical Assessment:   Vitals:   04/21/19 0908 04/21/19 0913  BP: (!) 141/95 (!) 142/96  Pulse: (!) 120   Weight: 184 lb (83.5 kg)   Body mass index is 37.16 kg/m.           Physical Examination:   General appearance: alert, well appearing, and in no distress  Mental status: alert, oriented to person, place, and time  Skin: warm & dry   Extremities: Edema: Trace    Cardiovascular: normal heart rate noted  Respiratory: normal respiratory effort, no distress  Abdomen: gravid, soft, non-tender  Pelvic: Cervical exam deferred         Fetal Status: Fetal Heart Rate (bpm): 152 Fundal Height: 37 cm Movement: Present    Fetal Surveillance Testing today: FHR 152   Results for orders placed or performed in visit on 04/21/19 (from the past 24 hour(s))  POC Urinalysis Dipstick OB   Collection Time: 04/21/19  9:11 AM  Result Value Ref Range   Color, UA     Clarity, UA     Glucose, UA Negative Negative   Bilirubin, UA     Ketones, UA neg    Spec Grav, UA      Blood, UA neg    pH, UA     POC,PROTEIN,UA Negative Negative, Trace, Small (1+), Moderate (2+), Large (3+), 4+   Urobilinogen, UA     Nitrite, UA neg    Leukocytes, UA Negative Negative   Appearance     Odor      Assessment & Plan:  1) High-risk pregnancy G1P0000 at [redacted]w[redacted]d with an Estimated Date of Delivery: 05/07/19   2) Gestational Hypertesnion, stable, borderline, she is taking her BP at hoe 3-4 times per day and they are running 130s/80s none >140 or >90 at home Feels fine no CNS symptoms  3) Chronic subutex therapy, stable, on 4 mg QID  Meds: No orders of the defined types were placed in this encounter.   Labs/procedures today:   Treatment Plan:  BPP in 3 days with close BP surveillance at home, instructions given, understands may need to be induced at any time, 39 weeks probably at the outside  Reviewed: Term labor symptoms and general obstetric precautions including but not limited to vaginal bleeding, contractions, leaking of fluid and fetal movement were reviewed in detail with the patient.  All questions were answered.  Follow-up: Return in about 3 days (around 04/24/2019) for BPP/sono, HROB.  Orders Placed This Encounter  Procedures  . US Fetal BPP W/O Non Stress  . POC Urinalysis Dipstick OB   Haden Suder [facility] [place]  04/21/2019 9:30 AM

## 2019-04-23 ENCOUNTER — Other Ambulatory Visit: Payer: Self-pay | Admitting: Obstetrics & Gynecology

## 2019-04-23 ENCOUNTER — Encounter: Payer: Self-pay | Admitting: *Deleted

## 2019-04-23 DIAGNOSIS — O133 Gestational [pregnancy-induced] hypertension without significant proteinuria, third trimester: Secondary | ICD-10-CM

## 2019-04-23 LAB — GC/CHLAMYDIA PROBE AMP
Chlamydia trachomatis, NAA: POSITIVE — AB
Neisseria Gonorrhoeae by PCR: NEGATIVE

## 2019-04-24 ENCOUNTER — Inpatient Hospital Stay (HOSPITAL_COMMUNITY)
Admission: AD | Admit: 2019-04-24 | Payer: Medicaid Other | Source: Home / Self Care | Admitting: Obstetrics and Gynecology

## 2019-04-24 ENCOUNTER — Other Ambulatory Visit: Payer: Self-pay | Admitting: Obstetrics and Gynecology

## 2019-04-24 ENCOUNTER — Ambulatory Visit (INDEPENDENT_AMBULATORY_CARE_PROVIDER_SITE_OTHER): Payer: Medicaid Other

## 2019-04-24 ENCOUNTER — Other Ambulatory Visit: Payer: Self-pay

## 2019-04-24 ENCOUNTER — Telehealth: Payer: Self-pay | Admitting: Obstetrics & Gynecology

## 2019-04-24 ENCOUNTER — Encounter (HOSPITAL_COMMUNITY): Payer: Self-pay | Admitting: *Deleted

## 2019-04-24 ENCOUNTER — Encounter: Payer: Self-pay | Admitting: *Deleted

## 2019-04-24 ENCOUNTER — Inpatient Hospital Stay (HOSPITAL_COMMUNITY)
Admission: AD | Admit: 2019-04-24 | Discharge: 2019-04-27 | DRG: 768 | Disposition: A | Discharge status: Home / Self Care | Payer: Medicaid Other | Attending: Obstetrics and Gynecology | Admitting: Obstetrics and Gynecology

## 2019-04-24 ENCOUNTER — Encounter: Payer: Self-pay | Admitting: Obstetrics and Gynecology

## 2019-04-24 ENCOUNTER — Ambulatory Visit (INDEPENDENT_AMBULATORY_CARE_PROVIDER_SITE_OTHER): Payer: Medicaid Other | Admitting: Obstetrics and Gynecology

## 2019-04-24 VITALS — BP 127/90 | HR 99 | Temp 98.1°F | Wt 183.6 lb

## 2019-04-24 DIAGNOSIS — F17200 Nicotine dependence, unspecified, uncomplicated: Secondary | ICD-10-CM | POA: Diagnosis present

## 2019-04-24 DIAGNOSIS — F418 Other specified anxiety disorders: Secondary | ICD-10-CM | POA: Diagnosis present

## 2019-04-24 DIAGNOSIS — F1420 Cocaine dependence, uncomplicated: Secondary | ICD-10-CM | POA: Diagnosis present

## 2019-04-24 DIAGNOSIS — F1120 Opioid dependence, uncomplicated: Secondary | ICD-10-CM | POA: Diagnosis present

## 2019-04-24 DIAGNOSIS — O164 Unspecified maternal hypertension, complicating childbirth: Secondary | ICD-10-CM | POA: Diagnosis not present

## 2019-04-24 DIAGNOSIS — O99334 Smoking (tobacco) complicating childbirth: Secondary | ICD-10-CM | POA: Diagnosis present

## 2019-04-24 DIAGNOSIS — N939 Abnormal uterine and vaginal bleeding, unspecified: Secondary | ICD-10-CM

## 2019-04-24 DIAGNOSIS — O134 Gestational [pregnancy-induced] hypertension without significant proteinuria, complicating childbirth: Secondary | ICD-10-CM | POA: Diagnosis not present

## 2019-04-24 DIAGNOSIS — O99344 Other mental disorders complicating childbirth: Secondary | ICD-10-CM | POA: Diagnosis present

## 2019-04-24 DIAGNOSIS — Z3403 Encounter for supervision of normal first pregnancy, third trimester: Secondary | ICD-10-CM

## 2019-04-24 DIAGNOSIS — O9952 Diseases of the respiratory system complicating childbirth: Secondary | ICD-10-CM | POA: Diagnosis present

## 2019-04-24 DIAGNOSIS — O133 Gestational [pregnancy-induced] hypertension without significant proteinuria, third trimester: Secondary | ICD-10-CM

## 2019-04-24 DIAGNOSIS — Z3A38 38 weeks gestation of pregnancy: Secondary | ICD-10-CM

## 2019-04-24 DIAGNOSIS — O99214 Obesity complicating childbirth: Secondary | ICD-10-CM | POA: Diagnosis present

## 2019-04-24 DIAGNOSIS — F17210 Nicotine dependence, cigarettes, uncomplicated: Secondary | ICD-10-CM | POA: Diagnosis present

## 2019-04-24 DIAGNOSIS — Z8759 Personal history of other complications of pregnancy, childbirth and the puerperium: Secondary | ICD-10-CM | POA: Diagnosis present

## 2019-04-24 DIAGNOSIS — Z1159 Encounter for screening for other viral diseases: Secondary | ICD-10-CM | POA: Diagnosis not present

## 2019-04-24 DIAGNOSIS — Z1389 Encounter for screening for other disorder: Secondary | ICD-10-CM

## 2019-04-24 DIAGNOSIS — J45909 Unspecified asthma, uncomplicated: Secondary | ICD-10-CM | POA: Diagnosis present

## 2019-04-24 DIAGNOSIS — O99324 Drug use complicating childbirth: Secondary | ICD-10-CM | POA: Diagnosis present

## 2019-04-24 DIAGNOSIS — Z331 Pregnant state, incidental: Secondary | ICD-10-CM

## 2019-04-24 LAB — CBC
HCT: 35.6 % — ABNORMAL LOW (ref 36.0–46.0)
Hemoglobin: 12 g/dL (ref 12.0–15.0)
MCH: 29.2 pg (ref 26.0–34.0)
MCHC: 33.7 g/dL (ref 30.0–36.0)
MCV: 86.6 fL (ref 80.0–100.0)
Platelets: 249 10*3/uL (ref 150–400)
RBC: 4.11 MIL/uL (ref 3.87–5.11)
RDW: 13.3 % (ref 11.5–15.5)
WBC: 16 10*3/uL — ABNORMAL HIGH (ref 4.0–10.5)
nRBC: 0 % (ref 0.0–0.2)

## 2019-04-24 LAB — POCT URINALYSIS DIPSTICK OB
Blood, UA: NEGATIVE
Glucose, UA: NEGATIVE
Ketones, UA: NEGATIVE
Leukocytes, UA: NEGATIVE
Nitrite, UA: NEGATIVE
POC,PROTEIN,UA: NEGATIVE

## 2019-04-24 LAB — RAPID URINE DRUG SCREEN, HOSP PERFORMED
Amphetamines: NOT DETECTED
Barbiturates: NOT DETECTED
Benzodiazepines: NOT DETECTED
Cocaine: NOT DETECTED
Opiates: NOT DETECTED
Tetrahydrocannabinol: NOT DETECTED

## 2019-04-24 LAB — COMPREHENSIVE METABOLIC PANEL
ALT: 14 U/L (ref 0–44)
AST: 33 U/L (ref 15–41)
Albumin: 3 g/dL — ABNORMAL LOW (ref 3.5–5.0)
Alkaline Phosphatase: 114 U/L (ref 38–126)
Anion gap: 11 (ref 5–15)
BUN: 8 mg/dL (ref 6–20)
CO2: 19 mmol/L — ABNORMAL LOW (ref 22–32)
Calcium: 9.1 mg/dL (ref 8.9–10.3)
Chloride: 105 mmol/L (ref 98–111)
Creatinine, Ser: 0.64 mg/dL (ref 0.44–1.00)
GFR calc Af Amer: >60 mL/min (ref >60)
GFR calc non Af Amer: >60 mL/min (ref >60)
Glucose, Bld: 85 mg/dL (ref 70–99)
Potassium: 4.6 mmol/L (ref 3.5–5.1)
Sodium: 135 mmol/L (ref 135–145)
Total Bilirubin: 1 mg/dL (ref 0.3–1.2)
Total Protein: 6.1 g/dL — ABNORMAL LOW (ref 6.5–8.1)

## 2019-04-24 LAB — URINALYSIS, ROUTINE W REFLEX MICROSCOPIC
Bilirubin Urine: NEGATIVE
Glucose, UA: NEGATIVE mg/dL
Ketones, ur: NEGATIVE mg/dL
Nitrite: NEGATIVE
Protein, ur: NEGATIVE mg/dL
Specific Gravity, Urine: 1.011 (ref 1.005–1.030)
pH: 6 (ref 5.0–8.0)

## 2019-04-24 LAB — PROTEIN / CREATININE RATIO, URINE
Creatinine, Urine: 68.38 mg/dL
Protein Creatinine Ratio: 0.2 mg/mg{Cre} — ABNORMAL HIGH (ref 0.00–0.15)
Total Protein, Urine: 14 mg/dL

## 2019-04-24 LAB — TYPE AND SCREEN
ABO/RH(D): A POS
Antibody Screen: NEGATIVE

## 2019-04-24 MED ORDER — OXYCODONE-ACETAMINOPHEN 5-325 MG PO TABS: 2.0000 | ORAL_TABLET | ORAL | Status: DC | PRN

## 2019-04-24 MED ORDER — OXYTOCIN 40 UNITS IN NORMAL SALINE INFUSION - SIMPLE MED
1.0000 m[IU]/min | INTRAVENOUS | Status: DC
  Administered 2019-04-24 – 2019-04-25 (×2): 2 m[IU]/min via INTRAVENOUS
  Filled 2019-04-24: qty 1000

## 2019-04-24 MED ORDER — BUPRENORPHINE HCL 8 MG SL SUBL
4.0000 mg | SUBLINGUAL_TABLET | Freq: Four times a day (QID) | SUBLINGUAL | Status: DC
  Administered 2019-04-24 – 2019-04-27 (×12): 4 mg via SUBLINGUAL
  Filled 2019-04-24 (×13): qty 1

## 2019-04-24 MED ORDER — LIDOCAINE HCL (PF) 1 % IJ SOLN
30.0000 mL | INTRAMUSCULAR | Status: AC | PRN
  Administered 2019-04-25: 30 mL via SUBCUTANEOUS

## 2019-04-24 MED ORDER — FENTANYL CITRATE (PF) 100 MCG/2ML IJ SOLN: 50.0000 ug | INTRAMUSCULAR | Status: DC | PRN

## 2019-04-24 MED ORDER — TERBUTALINE SULFATE 1 MG/ML IJ SOLN: 0.2500 mg | Freq: Once | INTRAMUSCULAR | Status: DC | PRN

## 2019-04-24 MED ORDER — OXYCODONE-ACETAMINOPHEN 5-325 MG PO TABS: 1.0000 | ORAL_TABLET | ORAL | Status: DC | PRN

## 2019-04-24 MED ORDER — OXYTOCIN BOLUS FROM INFUSION
500.0000 mL | Freq: Once | INTRAVENOUS | Status: AC
  Administered 2019-04-25: 23:00:00 500 mL via INTRAVENOUS

## 2019-04-24 MED ORDER — ONDANSETRON HCL 4 MG/2ML IJ SOLN: 4.0000 mg | Freq: Four times a day (QID) | INTRAMUSCULAR | Status: DC | PRN

## 2019-04-24 MED ORDER — LACTATED RINGERS IV SOLN
INTRAVENOUS | Status: DC
  Administered 2019-04-24 – 2019-04-25 (×5): via INTRAVENOUS

## 2019-04-24 MED ORDER — SOD CITRATE-CITRIC ACID 500-334 MG/5ML PO SOLN: 30.0000 mL | ORAL | Status: DC | PRN

## 2019-04-24 MED ORDER — OXYTOCIN 40 UNITS IN NORMAL SALINE INFUSION - SIMPLE MED
2.5000 [IU]/h | INTRAVENOUS | Status: DC
  Administered 2019-04-25: 2.5 [IU]/h via INTRAVENOUS

## 2019-04-24 MED ORDER — ACETAMINOPHEN 325 MG PO TABS: 650.0000 mg | ORAL_TABLET | ORAL | Status: DC | PRN

## 2019-04-24 MED ORDER — FLEET ENEMA 7-19 GM/118ML RE ENEM: 1.0000 | ENEMA | RECTAL | Status: DC | PRN

## 2019-04-24 MED ORDER — AZITHROMYCIN 500 MG PO TABS: 1000.0000 mg | ORAL_TABLET | Freq: Once | ORAL | 1 refills | Status: DC

## 2019-04-24 MED ORDER — NICOTINE 21 MG/24HR TD PT24
21.0000 mg | MEDICATED_PATCH | Freq: Every day | TRANSDERMAL | Status: DC
  Administered 2019-04-24 – 2019-04-25 (×2): 21 mg via TRANSDERMAL
  Filled 2019-04-24 (×3): qty 1

## 2019-04-24 MED ORDER — LACTATED RINGERS IV SOLN
500.0000 mL | INTRAVENOUS | Status: DC | PRN
  Administered 2019-04-24 – 2019-04-25 (×3): 500 mL via INTRAVENOUS

## 2019-04-24 NOTE — Plan of Care (Signed)
Pt comfortable in bed.  Does not want any narcotics during her stay.  Provider notified of pt's desire for nicotine patch and need for subutex order.  Will continue to monitor and notify team as needed.   Problem: Education: Goal: Knowledge of Childbirth will improve Outcome: Progressing Goal: Ability to make informed decisions regarding treatment and plan of care will improve Outcome: Progressing Goal: Ability to state and carry out methods to decrease the pain will improve Outcome: Progressing Goal: Individualized Educational Video(s) Outcome: Progressing   Problem: Coping: Goal: Ability to verbalize concerns and feelings about labor and delivery will improve Outcome: Progressing   Problem: Life Cycle: Goal: Ability to make normal progression through stages of labor will improve Outcome: Progressing Goal: Ability to effectively push during vaginal delivery will improve Outcome: Progressing   Problem: Role Relationship: Goal: Will demonstrate positive interactions with the child Outcome: Progressing   Problem: Safety: Goal: Risk of complications during labor and delivery will decrease Outcome: Progressing   Problem: Pain Management: Goal: Relief or control of pain from uterine contractions will improve Outcome: Progressing   Problem: Education: Goal: Knowledge of General Education information will improve Description Including pain rating scale, medication(s)/side effects and non-pharmacologic comfort measures Outcome: Progressing   Problem: Health Behavior/Discharge Planning: Goal: Ability to manage health-related needs will improve Outcome: Progressing   Problem: Clinical Measurements: Goal: Ability to maintain clinical measurements within normal limits will improve Outcome: Progressing Goal: Will remain free from infection Outcome: Progressing Goal: Diagnostic test results will improve Outcome: Progressing Goal: Respiratory complications will improve Outcome:  Progressing Goal: Cardiovascular complication will be avoided Outcome: Progressing   Problem: Activity: Goal: Risk for activity intolerance will decrease Outcome: Progressing   Problem: Nutrition: Goal: Adequate nutrition will be maintained Outcome: Progressing   Problem: Coping: Goal: Level of anxiety will decrease Outcome: Progressing   Problem: Elimination: Goal: Will not experience complications related to bowel motility Outcome: Progressing Goal: Will not experience complications related to urinary retention Outcome: Progressing   Problem: Pain Managment: Goal: General experience of comfort will improve Outcome: Progressing   Problem: Safety: Goal: Ability to remain free from injury will improve Outcome: Progressing   Problem: Skin Integrity: Goal: Risk for impaired skin integrity will decrease Outcome: Progressing

## 2019-04-24 NOTE — H&P (Signed)
OBSTETRIC ADMISSION HISTORY AND PHYSICAL  Andrea [name] is a 23 y.o. female G1P0000 with IUP at [redacted]w[redacted]d by L/10 presenting for induction of labor for gestational hypertension.   Reports fetal movement. Denies vaginal bleeding, leakage of fluids.   She received her prenatal care at [facility].  Support person in labor: Marlene Bast, boyfriend and FOB  Ultrasounds . 10w5: viability U/S . 13w0: normal NT, posterior placenta, small subchorionic hemorrhage . 19w1: breech, posterior placenta, normal anatomy . 36w6: normal interval growth, EFW 26%  Prenatal History/Complications: . Subchorionic hemorrhage in 1st trimester . Polysubstance use - history of cocaine, benzodiazepine and opioid dependence - weaned off of ben  Past Medical History: Past Medical History:  Diagnosis Date  . ADHD (attention deficit hyperactivity disorder)   . Anxiety   . Arthritis   . Asthma    inhaler last used 2 weeks ago  . History of kidney stones   . Migraines   . Ovarian cyst   . Parent-child relational problem   . Seizures (HCC)    hx one seizure over a year ago  . Subchorionic hemorrhage in first trimester 10/17/2018   Recheck at 19 weeks per JVF  . Vaginal Pap smear, abnormal     Past Surgical History: Past Surgical History:  Procedure Laterality Date  . CERVICAL ABLATION N/A 05/23/2017   Procedure: Laser Ablation of Cervix;  Surgeon: Lazaro Arms, MD;  Location: [facility];  Service: Gynecology;  Laterality: N/A;  . COLPOSCOPY    . EXTRACORPOREAL SHOCK WAVE LITHOTRIPSY    . WISDOM TOOTH EXTRACTION      Obstetrical History: OB History    Gravida  1   Para  0   Term  0   Preterm  0   AB  0   Living  0     SAB  0   TAB  0   Ectopic  0   Multiple  0   Live Births  0           Social History: Social History   Socioeconomic History  . Marital status: Single    Spouse name: [employer]  . Number of children: 1  . Years of education: 75  . Highest education level:  12th grade  Occupational History  . Not on file  Social Needs  . Financial resource strain: Not hard at all  . Food insecurity:    Worry: Never true    Inability: Never true  . Transportation needs:    Medical: No    Non-medical: No  Tobacco Use  . Smoking status: Current Every Day Smoker    Packs/day: 0.50    Years: 3.00    Pack years: 1.50    Types: Cigarettes  . Smokeless tobacco: Never Used  Substance and Sexual Activity  . Alcohol use: Not Currently    Comment: occasionally  . Drug use: No  . Sexual activity: Yes    Birth control/protection: None  Lifestyle  . Physical activity:    Days per week: 5 days    Minutes per session: 150+ min  . Stress: Very much  Relationships  . Social connections:    Talks on phone: Twice a week    Gets together: Once a week    Attends religious service: Never    Active member of club or organization: No    Attends meetings of clubs or organizations: Never    Relationship status: Never married  Other Topics Concern  . Not  on file  Social History Narrative  . Not on file    Family History: Family History  Problem Relation Age of Onset  . Birth defects Maternal Grandmother   . Cancer Maternal Grandmother   . Diabetes Maternal Grandmother   . Heart disease Maternal Grandmother     Allergies: Allergies  Allergen Reactions  . Banana Shortness Of Breath, Swelling and Rash  . Tylenol [Acetaminophen] Hives    Medications Prior to Admission  Medication Sig Dispense Refill Last Dose  . albuterol (PROVENTIL HFA;VENTOLIN HFA) 108 (90 Base) MCG/ACT inhaler Inhale 2 puffs into the lungs every 6 (six) hours as needed for wheezing or shortness of breath. (Patient not taking: Reported on 03/28/2019) 1 Inhaler 2 Not Taking  . azithromycin (ZITHROMAX) 500 MG tablet Take 2 tablets (1,000 mg total) by mouth once for 1 dose. 2 tablet 1   . buprenorphine (SUBUTEX) 2 MG SUBL SL tablet Place 2 mg under the tongue 4 (four) times daily.    Taking   . prenatal vitamin w/FE, FA (PRENATAL 1 + 1) 27-1 MG TABS tablet Take 1 tablet by mouth daily at 12 noon. 30 each 12 Taking     Review of Systems  All systems reviewed and negative except as stated in HPI  Blood pressure (!) 142/95, pulse 93, temperature 98.6 F (37 C), temperature source Oral, resp. rate 18, height 4\' 11"  (1.499 m), weight 83 kg, last menstrual period 08/16/2018. General appearance: alert, well-appearing, NAD Lungs: no respiratory distress Heart: regular rate  Abdomen: soft, non-tender; gravid  Pelvic: deferred Extremities: no significant LE edema  Presentation: cephalic by sutures on RN check  Fetal monitoring: 140s/mod/+a/-d Uterine activity: irregular    Prenatal labs: ABO, Rh: --/--/A POS, A POS Performed at [facility], [address] [place], [place] [address]  [phone](05/14 1830) Antibody: NEG (05/14 1830) Rubella: 7.69 (11/18 1639) RPR: Non Reactive (02/20 0912)  HBsAg: Negative (11/18 1639)  HIV: Non Reactive (02/20 0912)  GBS: Negative (05/05 1330)  Glucola: normal 2-hr Genetic screening:  Normal NT  Prenatal Transfer Tool  Maternal Diabetes: No Genetic Screening: Normal Maternal Ultrasounds/Referrals: Normal Fetal Ultrasounds or other Referrals:  None Maternal Substance Abuse:  No Significant Maternal Medications:  None Significant Maternal Lab Results: None  Results for orders placed or performed during the hospital encounter of 04/24/19 (from the past 24 hour(s))  CBC   Collection Time: 04/24/19  6:13 PM  Result Value Ref Range   WBC 16.0 (H) 4.0 - 10.5 K/uL   RBC 4.11 3.87 - 5.11 MIL/uL   Hemoglobin 12.0 12.0 - 15.0 g/dL   HCT 57.835.6 (L) 46.936.0 - 62.946.0 %   MCV 86.6 80.0 - 100.0 fL   MCH 29.2 26.0 - 34.0 pg   MCHC 33.7 30.0 - 36.0 g/dL   RDW 52.813.3 41.311.5 - 24.415.5 %   Platelets 249 150 - 400 K/uL   nRBC 0.0 0.0 - 0.2 %  Comprehensive metabolic panel   Collection Time: 04/24/19  6:13 PM  Result Value Ref Range   Sodium 135 135 - 145  mmol/L   Potassium 4.6 3.5 - 5.1 mmol/L   Chloride 105 98 - 111 mmol/L   CO2 19 (L) 22 - 32 mmol/L   Glucose, Bld 85 70 - 99 mg/dL   BUN 8 6 - 20 mg/dL   Creatinine, Ser 0.100.64 0.44 - 1.00 mg/dL   Calcium 9.1 8.9 - 27.210.3 mg/dL   Total Protein 6.1 (L) 6.5 - 8.1 g/dL  Albumin 3.0 (L) 3.5 - 5.0 g/dL   AST 33 15 - 41 U/L   ALT 14 0 - 44 U/L   Alkaline Phosphatase 114 38 - 126 U/L   Total Bilirubin 1.0 0.3 - 1.2 mg/dL   GFR calc non Af Amer >60 >60 mL/min   GFR calc Af Amer >60 >60 mL/min   Anion gap 11 5 - 15  Type and screen   Collection Time: 04/24/19  6:30 PM  Result Value Ref Range   ABO/RH(D) A POS    Antibody Screen NEG    Sample Expiration      04/27/2019,2359 Performed at [facility], [address] [place], [place] [address]   ABO/Rh   Collection Time: 04/24/19  6:30 PM  Result Value Ref Range   ABO/RH(D)      A POS Performed at [facility], [address], [place], [place] [address]   Protein / creatinine ratio, urine   Collection Time: 04/24/19  6:45 PM  Result Value Ref Range   Creatinine, Urine 68.38 mg/dL   Total Protein, Urine 14 mg/dL   Protein Creatinine Ratio 0.20 (H) 0.00 - 0.15 mg/mg[Cre]  Urinalysis, Routine w reflex microscopic   Collection Time: 04/24/19  6:45 PM  Result Value Ref Range   Color, Urine YELLOW YELLOW   APPearance HAZY (A) CLEAR   Specific Gravity, Urine 1.011 1.005 - 1.030   pH 6.0 5.0 - 8.0   Glucose, UA NEGATIVE NEGATIVE mg/dL   Hgb urine dipstick LARGE (A) NEGATIVE   Bilirubin Urine NEGATIVE NEGATIVE   Ketones, ur NEGATIVE NEGATIVE mg/dL   Protein, ur NEGATIVE NEGATIVE mg/dL   Nitrite NEGATIVE NEGATIVE   Leukocytes,Ua MODERATE (A) NEGATIVE   RBC / HPF 0-5 0 - 5 RBC/hpf   WBC, UA 21-50 0 - 5 WBC/hpf   Bacteria, UA RARE (A) NONE SEEN   Squamous Epithelial / LPF 11-20 0 - 5   Mucus PRESENT   Urine rapid drug screen (hosp performed)   Collection Time: 04/24/19  7:54 PM  Result Value Ref Range   Opiates NONE  DETECTED NONE DETECTED   Cocaine NONE DETECTED NONE DETECTED   Benzodiazepines NONE DETECTED NONE DETECTED   Amphetamines NONE DETECTED NONE DETECTED   Tetrahydrocannabinol NONE DETECTED NONE DETECTED   Barbiturates NONE DETECTED NONE DETECTED  Results for orders placed or performed in visit on 04/24/19 (from the past 24 hour(s))  POC Urinalysis Dipstick OB   Collection Time: 04/24/19 11:31 AM  Result Value Ref Range   Color, UA     Clarity, UA     Glucose, UA Negative Negative   Bilirubin, UA     Ketones, UA neg    Spec Grav, UA     Blood, UA neg    pH, UA     POC,PROTEIN,UA Negative Negative, Trace, Small (1+), Moderate (2+), Large (3+), 4+   Urobilinogen, UA     Nitrite, UA neg    Leukocytes, UA Negative Negative   Appearance     Odor      Patient Active Problem List   Diagnosis Date Noted  . Gestational hypertension affecting first pregnancy 04/24/2019  . H/O opiate addiction 10/29/2018  . Long term prescription benzodiazepine use 10/29/2018  . Supervision of normal first pregnancy 10/28/2018  . Smoker 10/28/2018  . Depression with anxiety 10/28/2018  . Subchorionic hemorrhage in first trimester 10/17/2018  . Abnormal Pap smear of cervix 03/12/2017    Assessment/Plan:  Cecile Sheerer  is a 23 y.o. G1P0000 at [redacted]w[redacted]d here for induction of labor for gestational hypertension.   Labor: IOL for gHTN with pitocin based on favorable exam in office today. Will plan to reassess at next check and determine if additional cervical ripening warranted.  -- pain control: epidural  on subutex  QID (divides  tablets) - confirmed dose and PDMP, ordered and awaiting verification from pharmacy   Fetal Wellbeing: EFW 26% (2745g) at 36w6. Cephalic by sutures on RN check .  -- GBS (negative) -- continuous fetal monitoring - category I   Gestational Hypertension: New diagnosis today in clinic necessitating induction. Asymptomatic. UPC 0.20, labs wnl. Normal to moderate range  pressures here.  -- continue to monitor closely   Postpartum Planning -- breast/depo -- RI/[x] Tdap/[x] flu   Laurel S. Earlene Plater, DO OB/GYN Fellow

## 2019-04-24 NOTE — Progress Notes (Signed)
[place] 38+1 wks,cephalic,posterior placenta gr 3,afi 12.7 cm,fhr 171 bpm,elevated UAD w/end diastolic flow ,RI .72,.74,.79=99%,S/D 4.1=99.8%

## 2019-04-24 NOTE — Progress Notes (Signed)
Patient ID: Andrea [name], female   DOB: 05-12-1996, 23 y.o.   MRN: [id number]    [facility] PREGNANCY VISIT Patient name: Andrea [name] MRN [id number]  Date of birth: 03/31/1996 Chief Complaint:   Routine Prenatal Visit (u/s today)  History of Present Illness:   Andrea [name] is a 23 y.o. G1P0000 female at [redacted]w[redacted]d with an Estimated Date of Delivery: 05/07/19 being seen today for ongoing management of a high-risk pregnancy complicated by gestational HTN.  She had a ultrasound with BPP performed today and Dopplers.  BPP is 8/8, Dopplers indicate high resistance at 99th percentile.  Patient denies headache scotoma or blurry vision.  Blood pressure is 90 diastolic today.  We will schedule for induction Today she reports no complaints. Previously on Subutex 2 mg QID and is now prescribed 4 mg QID by her pain medicine specialist, beginning a few weeks ago however, pt says she only takes a slightly lower dosage because she does not need to take more. She has not been to [facility] classes..  She is aware that the baby may need to stay and is familiar with the phrase "neonatal abstinence syndrome "   Contractions: Regular.  .  Movement: Present. denies leaking of fluid.  Review of Systems:   Pertinent items are noted in HPI Denies abnormal vaginal discharge w/ itching/odor/irritation, headaches, visual changes, shortness of breath, chest pain, abdominal pain, severe nausea/vomiting, or problems with urination or bowel movements unless otherwise stated above. Pertinent History Reviewed:  Reviewed past medical,surgical, social, obstetrical and family history.  Reviewed problem list, medications and allergies. Physical Assessment:   Vitals:   04/24/19 1128  BP: 127/90  Pulse: 99  Temp: 98.1 F (36.7 C)  Weight: 183 lb 9.6 oz (83.3 kg)  Body mass index is 37.08 kg/m.           Physical Examination:   General appearance: alert, well appearing, and in no distress and oriented to person, place, and  time  Mental status: alert, oriented to person, place, and time, normal mood, behavior, speech, dress, motor activity, and thought processes, affect appropriate to mood  Skin: warm & dry   Extremities: Edema: Trace    Cardiovascular: normal heart rate noted  Respiratory: normal respiratory effort, no distress  Abdomen: gravid, soft, non-tender  Pelvic: Cervical exam performed  Dilation: 2 Effacement (%): 50 Station: -1 Vertex well applied to mid position cervix Fetal Status: Fetal Heart Rate (bpm): 171 u/s, 142 doppler Fundal Height: 38 cm Movement: Present    Fetal Surveillance Testing today: BPP [place] 38+1 wks,cephalic,posterior placenta gr 3,afi 12.7 cm,fhr 171 bpm,elevated UAD w/end diastolic flow ,RI .72,.74,.79=99%,S/D 4.1=99.8%  Results for orders placed or performed in visit on 04/24/19 (from the past 24 hour(s))  POC Urinalysis Dipstick OB   Collection Time: 04/24/19 11:31 AM  Result Value Ref Range   Color, UA     Clarity, UA     Glucose, UA Negative Negative   Bilirubin, UA     Ketones, UA neg    Spec Grav, UA     Blood, UA neg    pH, UA     POC,PROTEIN,UA Negative Negative, Trace, Small (1+), Moderate (2+), Large (3+), 4+   Urobilinogen, UA     Nitrite, UA neg    Leukocytes, UA Negative Negative   Appearance     Odor      Assessment & Plan:  1) High-risk pregnancy G1P0000 at 110w1d with an Estimated Date of Delivery: 05/07/19   2)  Gestational HTN, stable at term\ 3.  Elevated Doppler studies at 99th percentile for gestational age, also indications for delivery  3) Chronic Subutex Therapy, stable, 4 mg QID   4) Asthma, occasionally acts up  5) SCHEDULED IOL.  Patient has transportation issues and will be able to be at the hospital by 6 PM after picking up her husband from work  Meds: No orders of the defined types were placed in this encounter.  Labs/procedures today: none  Treatment Plan: Induction of labor tonight Reviewed: Importance of prompt arrival at  hospital for induction this evening..  All questions were answered.  Follow-up: No follow-ups on file.  Orders Placed This Encounter  Procedures  . POC Urinalysis Dipstick OB   By signing my name below, I, Pietro CassisEmily Tufford, attest that this documentation has been prepared under the direction and in the presence of Tilda BurrowFerguson, Aviella Disbrow V, MD. Electronically Signed: Pietro CassisEmily Tufford, Medical Scribe. 04/24/19. 12:30 PM.  I personally performed the services described in this documentation, which was SCRIBED in my presence. The recorded information has been reviewed and considered accurate. It has been edited as necessary during review. Tilda BurrowJohn V Knut Rondinelli, MD

## 2019-04-24 NOTE — Progress Notes (Signed)
OB/GYN Faculty Practice: Labor Progress Note  Subjective: Doing well, no complaints. Trying to get some rest.   Objective: BP 113/62   Pulse 90   Temp 98.8 F (37.1 C) (Oral)   Resp 16   Ht 4\' 11"  (1.499 m)   Wt 83 kg   LMP 08/16/2018 (Approximate)   BMI 36.96 kg/m  Gen: well-appearing, NAD Dilation: 2 Effacement (%): 80 Station: -2 Presentation: Vertex(confirmed by [place]) Exam by:: Marlis Edelson DO  Assessment and Plan: Andrea [name] is a 23 y.o. G1P0000 at [redacted]w[redacted]d here for induction of labor for gestational hypertension.   Labor: Cervix unchanged since last checked around 1900. Discussed placement of FB, patient agreeable. Placed with 60cc of fluid, tolerated well  -- will keep pitocin at 10 until FB out then titrate per protocol  -- pain control: epidural  on subutex 4mg  QID   Fetal Wellbeing: EFW 26% (2745g) at 36w6. Cephalic confirmed on BSUS. -- GBS (negative) -- continuous fetal monitoring - category I - had one prolonged decel which resolved with repositioning   Gestational Hypertension: New diagnosis today in clinic necessitating induction. Asymptomatic. UPC 0.20, labs wnl. Normal to moderate range pressures here.  -- continue to monitor closely    Mateja Dier S. Earlene Plater, DO OB/GYN Fellow, Faculty Practice  11:23 PM

## 2019-04-24 NOTE — Patient Instructions (Signed)
CHILDBIRTH CLASSES ° °[facility] °Call to Register: [phone] or [phone] or Register Online: [email or web address] ° °THESE CLASSES FILL UP VERY QUICKLY, SO SIGN UP AS SOON AS YOU CAN!!! ° °*Please visit [facility] pregnancy website at [email or web address]* ° °Option 1: Birth & Baby Series °• This series of 3 weekly classes helps you and your labor partner prepare for childbirth at [facility]. °• Reviews newborn care, labor & birth, pain management, and comfort techniques °• Maternity Care Center Tour of [facility] is included. °• Cost: $60 per couple for insured or self-pay, $30 per couple for Medicaid ° °Option 2: Weekend Birth & Baby °• This class is a weekend version of our Birth & Baby series. It is designed for parents who have a difficult time fitting several weeks of classes into their schedule.  °• Maternity Care Center Tour of [facility] is included.  °• Friday 6:30pm-8:30pm, Saturday 9am-4pm °• Cost: $75 per couple for insured or self-pay, $30 per couple for Medicaid ° °Option 3: Natural Childbirth °• This series of 5 weekly classes is for expectant parents who want to learn and practice natural methods of coping with the process of labor and childbirth. °• Maternity Care Center Tour of [facility] is included. °• Cost: $75 per couple for insured or self-pay, $30 per couple for Medicaid ° °Option 4: Online Birth & Baby °• This online class offers you the freedom to complete a Birth & Baby series in the comfort of your own home. The flexibility of this option allows you to review sections at your own place, at times convenient to you and your support people.  °• Cost: $60 for 60 days of online access ° ° ° °Other Available Classes °Baby & Me °Enjoy this time to discuss newborn & infant parenting topics and family adjustment issues with other new mothers in a relaxed environment. Each week brings a new speaker or baby-centered activity. We encourage  mothers and their babies (birth to crawling) to join us every Thursday in the [facility] at 11:00 am. You are welcome to visit this group even if you haven't delivered yet! It's wonderful to make new friends early and watch other moms interact with their babies. No registration or fee.  ° °Big Brother/ Big Sister °Let your children share in the joy of a new brother or sister in this special class designed just for them. This class is designed for children ages 2-6, but any age is welcome. Please register each child individually. ° °Breastfeeding Support Group °This group is a mother-to-mother support circle where moms have the opportunity to share their breastfeeding experiences. A breastfeeding Support nurse is present for questions and concerns. Meets each Tuesday at 11:00 am. No fee or registration. ° °Breastfeeding Your Baby °Breastfeeding is a special time for mother and child. This class will help you feel ready to begin this important relationship. Your partner is encouraged to attend with you.  ° °Caring For Baby °This class is for expectant  and adoptive parents who want to learn and practice the most up-to-date newborn care for their babies. Register only the mom-to-be and your partner can come with you. (Note: This class is included in the Birth & Baby series and the Weekend Birth & Baby classes.) ° °Comfort Techniques & Tour °This 2-hour interactive class will provide you the opportunity to learn & practice hands-on techniques with your partner that can help relieve some discomfort of labor and encourage your baby to rotate toward   the best position for birth. A tour of the [facility] Care Center is included.  ° °Daddy Boot Camp °This course offers Dads-to-be the tools and knowledge needed to feel confident on their journey to becoming new fathers. ° °Grandparent Love °Expecting a grandbaby? Learn about the latest infant care and safety recommendation and ways to  support your own child as he or she transitions into the parenting role.  ° °Infant and Child CPR °Parents, grandparents, babysitters, and friends learn Cardio-Pulmonary Resuscitation skills for infants and children. Register each participant individually. (Note: This Family & Friends program does not offer certification.) ° °Marvelous Multiples °Expecting twins, triplets, or more? This class covers the differences in labor, birth, parenting, and breastfeeding issues that face multiples' parents. NICU tour is included.  ° °Mom Talk °This mom-led group offers support and connection to mothers as they journey through the adjustments and struggles of that sometimes overwhelming first year after the birth of a child. A member of our [facility] staff will be present to share resources and additional support if needed, as you care for yourself and baby. You are welcome to visit the group before you deliver! It's wonderful to meet new friends early and watch other moms interact with their babies. It's held at [facility] at 10:00 am each Tuesday morning and 6:00 pm each Thursday evening. Babies (birth to crawling) welcome. No registration or fee.  ° °Waterbirth Classes °Interested in a waterbirth? This informational class will help you discover whether waterbirth is the right fir for you.  ° °[facility] Virtual Maternity Tour °View a virtual tour of [facility]. In-person tours are available for participants of childbirth education classes.  ° °

## 2019-04-25 ENCOUNTER — Inpatient Hospital Stay (HOSPITAL_COMMUNITY): Payer: Medicaid Other | Admitting: Anesthesiology

## 2019-04-25 LAB — CBC
HCT: 34.1 % — ABNORMAL LOW (ref 36.0–46.0)
Hemoglobin: 11.4 g/dL — ABNORMAL LOW (ref 12.0–15.0)
MCH: 28.9 pg (ref 26.0–34.0)
MCHC: 33.4 g/dL (ref 30.0–36.0)
MCV: 86.5 fL (ref 80.0–100.0)
Platelets: 234 10*3/uL (ref 150–400)
RBC: 3.94 MIL/uL (ref 3.87–5.11)
RDW: 13.5 % (ref 11.5–15.5)
WBC: 16.1 10*3/uL — ABNORMAL HIGH (ref 4.0–10.5)
nRBC: 0 % (ref 0.0–0.2)

## 2019-04-25 LAB — ABO/RH: ABO/RH(D): A POS

## 2019-04-25 LAB — SARS CORONAVIRUS 2 BY RT PCR (HOSPITAL ORDER, PERFORMED IN ~~LOC~~ HOSPITAL LAB): SARS Coronavirus 2: NEGATIVE

## 2019-04-25 MED ORDER — FENTANYL-BUPIVACAINE-NACL 0.5-0.125-0.9 MG/250ML-% EP SOLN
12.0000 mL/h | EPIDURAL | Status: DC | PRN
  Filled 2019-04-25: qty 250

## 2019-04-25 MED ORDER — PHENYLEPHRINE 40 MCG/ML (10ML) SYRINGE FOR IV PUSH (FOR BLOOD PRESSURE SUPPORT): 80.0000 ug | PREFILLED_SYRINGE | INTRAVENOUS | Status: DC | PRN

## 2019-04-25 MED ORDER — LIDOCAINE HCL (PF) 1 % IJ SOLN
INTRAMUSCULAR | Status: DC | PRN
  Administered 2019-04-25: 6 mL via EPIDURAL

## 2019-04-25 MED ORDER — EPHEDRINE 5 MG/ML INJ: 10.0000 mg | INTRAVENOUS | Status: DC | PRN

## 2019-04-25 MED ORDER — DIPHENHYDRAMINE HCL 50 MG/ML IJ SOLN: 12.5000 mg | INTRAMUSCULAR | Status: DC | PRN

## 2019-04-25 MED ORDER — LIDOCAINE HCL (PF) 1 % IJ SOLN: INTRAMUSCULAR | Status: DC | PRN

## 2019-04-25 MED ORDER — LIDOCAINE HCL (PF) 1 % IJ SOLN
INTRAMUSCULAR | Status: AC
  Filled 2019-04-25: qty 30

## 2019-04-25 MED ORDER — LACTATED RINGERS IV SOLN
500.0000 mL | Freq: Once | INTRAVENOUS | Status: AC
  Administered 2019-04-25: 16:00:00 500 mL via INTRAVENOUS

## 2019-04-25 MED ORDER — FENTANYL-BUPIVACAINE-NACL 0.5-0.125-0.9 MG/250ML-% EP SOLN: 12.0000 mL/h | EPIDURAL | Status: DC | PRN

## 2019-04-25 MED ORDER — SODIUM CHLORIDE (PF) 0.9 % IJ SOLN
INTRAMUSCULAR | Status: DC | PRN
  Administered 2019-04-25: 12 mL/h via EPIDURAL

## 2019-04-25 NOTE — Progress Notes (Signed)
Dr  Earlene Plater notified of constant uterine irritability and no true contractions on 70mu/min of pitocin.  Orders rec to d/c pitocin x 4 hours.  Patient may have regular diet while pitocin is off.

## 2019-04-25 NOTE — Progress Notes (Signed)
OB/GYN Faculty Practice: Labor Progress Note  Subjective: Doing well this morning, starting to feel a bit more uncomfortable.   Objective: BP 127/82   Pulse 88   Temp 98.4 F (36.9 C) (Axillary)   Resp 17   Ht 4\' 11"  (1.499 m)   Wt 83 kg   LMP 08/16/2018 (Approximate)   BMI 36.96 kg/m  Gen:  Tired-appearing, NAD Dilation: 4.5 Effacement (%): 80 Station: -2 Presentation: Vertex Exam by:: K Faucett RN  Assessment and Plan: Andrea [name] is a 23 y.o. G1P0000 at [redacted]w[redacted]d here for induction of labor for gestational hypertension.   Labor: FB now out, continuing to titrate pitocin. Will plan for early AROM when able.  -- pain control: desires epidural  on subutex 4mg  QID   Fetal Wellbeing: EFW 26% (2745g) at 36w6. Cephalic confirmed on BSUS. -- GBS (negative) -- continuous fetal monitoring - category I   Gestational Hypertension: New diagnosis in clinic yesterday necessitating induction. Asymptomatic. UPC 0.20, labs wnl. Normal to moderate range pressures here.  -- continue to monitor closely   Jaryn Hocutt S. Earlene Plater, DO OB/GYN Fellow, Faculty Practice  7:46 AM

## 2019-04-25 NOTE — Anesthesia Procedure Notes (Signed)
Epidural Patient location during procedure: OB Start time: 04/25/2019 4:20 PM End time: 04/25/2019 4:25 PM  Staffing Anesthesiologist: Bethena Midget, MD  Preanesthetic Checklist Completed: patient identified, site marked, surgical consent, pre-op evaluation, timeout performed, IV checked, risks and benefits discussed and monitors and equipment checked  Epidural Patient position: sitting Prep: site prepped and draped and DuraPrep Patient monitoring: continuous pulse ox and blood pressure Approach: midline Injection technique: LOR air  Needle:  Needle type: Tuohy  Needle gauge: 17 G Needle length: 9 cm and 9 Needle insertion depth: 6 cm Catheter type: closed end flexible Catheter size: 19 Gauge Catheter at skin depth: 11 cm Test dose: negative  Assessment Events: blood not aspirated, injection not painful, no injection resistance, negative IV test and no paresthesia

## 2019-04-25 NOTE — Progress Notes (Signed)
OB/GYN Faculty Practice: Labor Progress Note  Subjective: Strip note. Plan of care discussed with RN. Has been laboring down for about an hour, will be pushing with RN. Not feeling much at all with contractions, dense epidural.   Objective: BP 113/66   Pulse (!) 103   Temp 98.8 F (37.1 C) (Oral)   Resp 16   Ht 4\' 11"  (1.499 m)   Wt 83 kg   LMP 08/16/2018 (Approximate)   SpO2 100%   BMI 36.96 kg/m  Gen:  Strip note Dilation: 10 Dilation Complete Date: 04/25/19 Dilation Complete Time: 1855 Effacement (%): 100 Station: Plus 1 Presentation: Vertex Exam by:: L.Lima, RN  Assessment and Plan: Andrea [name] is a 23 y.o. G1P0000 at [redacted]w[redacted]d here for induction of labor for gestational hypertension.   Labor: FB out during the day today, was on pitocin for about 14 hours, given 4 hour pitocin break then restarted. SROM around 1440 and IUPC placed later this afternoon. Now complete and labored down for about an hour. Now at +2 station and will continue pushing.  -- pain control: epidural  on subutex 4mg  QID   Fetal Wellbeing: EFW 26% (2745g) at 36w6. Cephalic confirmed on BSUS. -- GBS (negative) -- continuous fetal monitoring - category I   Gestational Hypertension: New diagnosis in clinic yesterday necessitating induction. Asymptomatic. UPC 0.20, labs wnl. Normal to moderate range pressures here.  -- continue to monitor closely   Andrea Lippman S. Earlene Plater, DO OB/GYN Fellow, Faculty Practice  9:22 PM

## 2019-04-25 NOTE — Consult Note (Signed)
[facility]  Delivery Note:  SVD    04/25/2019  11:14 PM  I was called to the delivery room at the request of the patient's obstetrician (Dr. Emelda Fear) for vacuum extraction.  PRENATAL HX:  This is a 23 y/o G1P0 at 70 and 2/[redacted] weeks gestation who was admitted for IOL due to gestational hypertension.  Her pregnancy has been complicated by polysubstance use and opoid dependence for which she is on subutex.  She is GBS negative with SROM x9 hours.  Delivery assisted by vacuum.  DELIVERY:  Infant was vigorous at delivery, requiring no resuscitation other than standard warming, drying and stimulation on mother's chest.  Cord clamping delayed 1 minute and given how well appearing the infant was, it was not taken to warmer and was left with mother for skin-to-skin care.   _____________________ Electronically Signed By: Maryan Char, MD Neonatologist

## 2019-04-25 NOTE — Progress Notes (Signed)
LABOR PROGRESS NOTE  Andrea [name] is a 23 y.o. G1P0000 at [redacted]w[redacted]d  admitted for IOL for gHTN.   Subjective: Patient is comfortable after epidural   Objective: BP 106/66   Pulse 93   Temp 98.1 F (36.7 C)   Resp 16   Ht 4\' 11"  (1.499 m)   Wt 83 kg   LMP 08/16/2018 (Approximate)   SpO2 100%   BMI 36.96 kg/m  or  Vitals:   04/25/19 1635 04/25/19 1640 04/25/19 1645 04/25/19 1650  BP: (!) 104/56 95/60 114/64 106/66  Pulse: 100 93 90 93  Resp: 18 16 16 16   Temp:      TempSrc:      SpO2:  99% 100% 100%  Weight:      Height:        Dilation: 4.5 Effacement (%): 80 Station: -2 Presentation: Vertex Exam by:: Francene Finders, RN  FHT: baseline rate 155, moderate varibility, +accel, no decel Toco: Irritability with occasional ctx, unable to trace well IUPC placed. +Fetal head molding noted  Labs: Lab Results  Component Value Date   WBC 16.1 (H) 04/25/2019   HGB 11.4 (L) 04/25/2019   HCT 34.1 (L) 04/25/2019   MCV 86.5 04/25/2019   PLT 234 04/25/2019    Patient Active Problem List   Diagnosis Date Noted  . Gestational hypertension affecting first pregnancy 04/24/2019  . H/O opiate addiction 10/29/2018  . Long term prescription benzodiazepine use 10/29/2018  . Supervision of normal first pregnancy 10/28/2018  . Smoker 10/28/2018  . Depression with anxiety 10/28/2018  . Subchorionic hemorrhage in first trimester 10/17/2018  . Abnormal Pap smear of cervix 03/12/2017    Assessment / Plan: 23 y.o. G1P0000 at [redacted]w[redacted]d here for IOL for gHTN. BPs normotensive.   Labor: Induction. S/p FB. On pitocin per protocol. IUPC placed. Concerned about molding on exam, fetal presentation seems asynclitic at this point.  Fetal Wellbeing:  Cat I  Pain Control:  Epidural upon maternal request  Anticipated MOD:  NSVD    Jaynie Collins, MD, FACOG Obstetrician & Gynecologist, [facility] for [employer], [facility] 04/25/2019, 5:24 PM

## 2019-04-25 NOTE — Progress Notes (Addendum)
Just in room with patient pushing through several contractions. No movement of fetal head with contractions. Dense epidural and not feeling contractions or pressure. On my exam, fetal head is LOT and +2 station with some molding. Initially called complete at 1845 so has been about 3 hours. Actively pushing for last hour with no change. Will try side-lying on left with peanut ball to help with fetal rotation. Appreciate RN calling anesthesia, will plan to turn off epidural to help with feeling contractions and pressure. Will reevaluate in about 30 minutes.   Discussed with Dr. Emelda Fear to make him aware that Stage II for over 3 hours. He agreed with plan, will touch base in about an hour to make aware of progress.   Cristal Deer. Earlene Plater, DO OB/GYN Fellow

## 2019-04-25 NOTE — Progress Notes (Signed)
LABOR PROGRESS NOTE  Andrea [name] is a 23 y.o. G1P0000 at [redacted]w[redacted]d  admitted for IOL for gHTN.   Subjective: Strip note. Discussed plan of care with RN.   Objective: BP 118/85   Pulse 93   Temp 98.6 F (37 C) (Oral)   Resp 18   Ht 4\' 11"  (1.499 m)   Wt 83 kg   LMP 08/16/2018 (Approximate)   BMI 36.96 kg/m  or  Vitals:   04/25/19 0601 04/25/19 0744 04/25/19 0818 04/25/19 0854  BP: 130/85 127/82 113/77 118/85  Pulse: (!) 103 88 (!) 102 93  Resp:  16 18 18   Temp:  98.6 F (37 C)    TempSrc:  Oral    Weight:      Height:        Dilation: 4.5 Effacement (%): 80 Station: -2 Presentation: Vertex Exam by:: K Faucett RN FHT: baseline rate 155, moderate varibility, +acel, no decel Toco: Irritability with occasional ctx   Labs: Lab Results  Component Value Date   WBC 16.0 (H) 04/24/2019   HGB 12.0 04/24/2019   HCT 35.6 (L) 04/24/2019   MCV 86.6 04/24/2019   PLT 249 04/24/2019    Patient Active Problem List   Diagnosis Date Noted  . Gestational hypertension affecting first pregnancy 04/24/2019  . H/O opiate addiction 10/29/2018  . Long term prescription benzodiazepine use 10/29/2018  . Supervision of normal first pregnancy 10/28/2018  . Smoker 10/28/2018  . Depression with anxiety 10/28/2018  . Subchorionic hemorrhage in first trimester 10/17/2018  . Abnormal Pap smear of cervix 03/12/2017    Assessment / Plan: 23 y.o. G1P0000 at [redacted]w[redacted]d here for IOL for gHTN. BPs normotensive.   Labor: Induction. S/p FB. Pitocin started at 1900 on 5/14 without any cervical change and just uterine irritability on toco. Patient has completed 4 hour Pitocin break to flush receptors. Resume at 1300 at 81mu.  Fetal Wellbeing:  Cat I  Pain Control:  Epidural upon maternal request  Anticipated MOD:  NSVD   Marcy Siren, D.O. OB Fellow  04/25/2019, 11:48 AM

## 2019-04-25 NOTE — Anesthesia Preprocedure Evaluation (Signed)
Anesthesia Evaluation  Patient identified by MRN, date of birth, ID band Patient awake    Reviewed: Allergy & Precautions, H&P , NPO status , Patient's Chart, lab work & pertinent test results, reviewed documented beta blocker date and time   Airway Mallampati: II  TM Distance: >3 FB Neck ROM: full    Dental no notable dental hx.    Pulmonary neg pulmonary ROS, Current Smoker,    Pulmonary exam normal breath sounds clear to auscultation       Cardiovascular hypertension, Pt. on medications Normal cardiovascular exam Rhythm:regular Rate:Normal     Neuro/Psych negative neurological ROS  negative psych ROS   GI/Hepatic negative GI ROS, Neg liver ROS,   Endo/Other  Morbid obesity  Renal/GU negative Renal ROS  negative genitourinary   Musculoskeletal   Abdominal   Peds  Hematology negative hematology ROS (+)   Anesthesia Other Findings   Reproductive/Obstetrics (+) Pregnancy                             Anesthesia Physical Anesthesia Plan  ASA: III  Anesthesia Plan: Epidural   Post-op Pain Management:    Induction:   PONV Risk Score and Plan:   Airway Management Planned:   Additional Equipment:   Intra-op Plan:   Post-operative Plan:   Informed Consent: I have reviewed the patients History and Physical, chart, labs and discussed the procedure including the risks, benefits and alternatives for the proposed anesthesia with the patient or authorized representative who has indicated his/her understanding and acceptance.       Plan Discussed with:   Anesthesia Plan Comments:         Anesthesia Quick Evaluation

## 2019-04-25 NOTE — Progress Notes (Signed)
OB/GYN Faculty Practice: Labor Progress Note  Subjective: Getting up to go to bathroom.   Objective: BP 117/75   Pulse 99   Temp 98.8 F (37.1 C) (Oral)   Resp 16   Ht 4\' 11"  (1.499 m)   Wt 83 kg   LMP 08/16/2018 (Approximate)   BMI 36.96 kg/m  Gen: strip note Dilation: 2 Effacement (%): 80 Station: -2 Presentation: Vertex(confirmed by [place]) Exam by:: Marlis Edelson DO  Assessment and Plan: Andrea [name] is a 23 y.o. G1P0000 at [redacted]w[redacted]d here for induction of labor for gestational hypertension.   Labor: FB still in place.  -- will keep pitocin at 10 until FB out then titrate per protocol  -- pain control: epidural  on subutex 4mg  QID   Fetal Wellbeing: EFW 26% (2745g) at 36w6. Cephalic confirmed on BSUS. -- GBS (negative) -- continuous fetal monitoring - category I - had one prolonged decel which resolved with repositioning   Gestational Hypertension: New diagnosis in clinic yesterday necessitating induction. Asymptomatic. UPC 0.20, labs wnl. Normal to moderate range pressures here.  -- continue to monitor closely   Dakotah Orrego S. Earlene Plater, DO OB/GYN Fellow, Faculty Practice  3:13 AM

## 2019-04-26 ENCOUNTER — Encounter (HOSPITAL_COMMUNITY): Payer: Self-pay

## 2019-04-26 DIAGNOSIS — O134 Gestational [pregnancy-induced] hypertension without significant proteinuria, complicating childbirth: Secondary | ICD-10-CM

## 2019-04-26 DIAGNOSIS — Z3A38 38 weeks gestation of pregnancy: Secondary | ICD-10-CM

## 2019-04-26 DIAGNOSIS — O139 Gestational [pregnancy-induced] hypertension without significant proteinuria, unspecified trimester: Secondary | ICD-10-CM

## 2019-04-26 MED ORDER — ONDANSETRON HCL 4 MG PO TABS: 4.0000 mg | ORAL_TABLET | ORAL | Status: DC | PRN

## 2019-04-26 MED ORDER — MEASLES, MUMPS & RUBELLA VAC IJ SOLR: 0.5000 mL | Freq: Once | INTRAMUSCULAR | Status: DC

## 2019-04-26 MED ORDER — PRENATAL MULTIVITAMIN CH
1.0000 | ORAL_TABLET | Freq: Every day | ORAL | Status: DC
  Administered 2019-04-26 – 2019-04-27 (×2): 1 via ORAL
  Filled 2019-04-26 (×2): qty 1

## 2019-04-26 MED ORDER — BENZOCAINE-MENTHOL 20-0.5 % EX AERO
1.0000 "application " | INHALATION_SPRAY | CUTANEOUS | Status: DC | PRN
  Administered 2019-04-26: 1 via TOPICAL
  Filled 2019-04-26: qty 56

## 2019-04-26 MED ORDER — NICOTINE 21 MG/24HR TD PT24
21.0000 mg | MEDICATED_PATCH | Freq: Every day | TRANSDERMAL | Status: DC
  Filled 2019-04-26 (×3): qty 1

## 2019-04-26 MED ORDER — DIBUCAINE (PERIANAL) 1 % EX OINT: 1.0000 "application " | TOPICAL_OINTMENT | CUTANEOUS | Status: DC | PRN

## 2019-04-26 MED ORDER — MEDROXYPROGESTERONE ACETATE 150 MG/ML IM SUSP
150.0000 mg | Freq: Once | INTRAMUSCULAR | Status: AC
  Administered 2019-04-27: 10:00:00 150 mg via INTRAMUSCULAR
  Filled 2019-04-26: qty 1

## 2019-04-26 MED ORDER — DIPHENHYDRAMINE HCL 25 MG PO CAPS: 25.0000 mg | ORAL_CAPSULE | Freq: Four times a day (QID) | ORAL | Status: DC | PRN

## 2019-04-26 MED ORDER — ZOLPIDEM TARTRATE 5 MG PO TABS: 5.0000 mg | ORAL_TABLET | Freq: Every evening | ORAL | Status: DC | PRN

## 2019-04-26 MED ORDER — WITCH HAZEL-GLYCERIN EX PADS: 1.0000 "application " | MEDICATED_PAD | CUTANEOUS | Status: DC | PRN

## 2019-04-26 MED ORDER — TETANUS-DIPHTH-ACELL PERTUSSIS 5-2.5-18.5 LF-MCG/0.5 IM SUSP: 0.5000 mL | Freq: Once | INTRAMUSCULAR | Status: DC

## 2019-04-26 MED ORDER — IBUPROFEN 600 MG PO TABS
600.0000 mg | ORAL_TABLET | Freq: Four times a day (QID) | ORAL | Status: DC
  Administered 2019-04-26 – 2019-04-27 (×8): 600 mg via ORAL
  Filled 2019-04-26 (×8): qty 1

## 2019-04-26 MED ORDER — SIMETHICONE 80 MG PO CHEW: 80.0000 mg | CHEWABLE_TABLET | ORAL | Status: DC | PRN

## 2019-04-26 MED ORDER — ONDANSETRON HCL 4 MG/2ML IJ SOLN: 4.0000 mg | INTRAMUSCULAR | Status: DC | PRN

## 2019-04-26 MED ORDER — COCONUT OIL OIL: 1.0000 "application " | TOPICAL_OIL | Status: DC | PRN

## 2019-04-26 MED ORDER — SENNOSIDES-DOCUSATE SODIUM 8.6-50 MG PO TABS
2.0000 | ORAL_TABLET | ORAL | Status: DC
  Administered 2019-04-26: 23:00:00 2 via ORAL
  Filled 2019-04-26: qty 2

## 2019-04-26 NOTE — Lactation Note (Signed)
This note was copied from a baby's chart. Lactation Consultation Note  Patient Name: Andrea [name] Today's Date: 04/26/2019  P1, 5 hour female infant. LC  was attempting to see patient, Nurse informed LC that mom is currently sleeping due to long labour and mom only wants to formula feed tonight plans to breastfeed later in the day.  Nurse explain LEAD to mom and risk factors in delaying nursing at breast. Infant was very spitty last night with emesis. Mom 's feeding choice at admission was breast and bottle feeding. Mom with hx of GHTN and on subutex due to hx of drug usage, please refer to mom's chart.     Maternal Data    Feeding    LATCH Score                   Interventions    Lactation Tools Discussed/Used     Consult Status      [name] 04/26/2019, 5:12 AM

## 2019-04-26 NOTE — Progress Notes (Addendum)
Post Partum Day 1 Subjective: no complaints, up ad lib, voiding, tolerating PO and + flatus  Objective: Blood pressure 120/86, pulse 77, temperature 98.2 F (36.8 C), temperature source Oral, resp. rate 18, height 4\' 11"  (1.499 m), weight 83 kg, last menstrual period 08/16/2018, SpO2 100 %, unknown if currently breastfeeding.  Physical Exam:  General: alert, cooperative and no distress Lochia: appropriate Uterine Fundus: firm Incision: NA DVT Evaluation: No evidence of DVT seen on physical exam.  Recent Labs    04/24/19 1813 04/25/19 1532  HGB 12.0 11.4*  HCT 35.6* 34.1*    Assessment/Plan: Plan for discharge tomorrow  GHTN with BP range 120-136/72-86 since 0000 today Will continue to monitor and consider medication as needed  Planning depo, will order for tomorrow prior to DC home.    LOS: 2 days   Thressa Sheller DNP, CNM  04/26/19  10:24 AM

## 2019-04-26 NOTE — Anesthesia Postprocedure Evaluation (Signed)
Anesthesia Post Note  Patient: Andrea [name]  Procedure(s) Performed: AN AD HOC LABOR EPIDURAL     Patient location during evaluation: Mother Baby Anesthesia Type: Epidural Level of consciousness: awake and alert, oriented and patient cooperative Pain management: pain level controlled Vital Signs Assessment: post-procedure vital signs reviewed and stable Respiratory status: spontaneous breathing Cardiovascular status: stable Postop Assessment: no headache, epidural receding, patient able to bend at knees and no signs of nausea or vomiting Anesthetic complications: no Comments: Pt. Interviewed via phone call d/t COVID 19 precautions. Pt states she is walking.  Pain score 4.     Last Vitals:  Vitals:   04/26/19 0310 04/26/19 0630  BP: 134/72 129/77  Pulse: 90 94  Resp: 16 18  Temp: 36.7 C 36.7 C  SpO2:      Last Pain:  Vitals:   04/26/19 0630  TempSrc: Oral  PainSc: 3    Pain Goal:                   [facility]

## 2019-04-26 NOTE — Discharge Summary (Signed)
Obstetrics Discharge Summary OB/GYN Faculty Practice   Patient Name: Andrea [name] DOB: 1996/04/03 MRN: [id number]  Date of admission: 04/24/2019 Delivering MD: Tamera Stands   Date of discharge: 04/27/2019  Admitting diagnosis: pregnancy Intrauterine pregnancy: [redacted]w[redacted]d     Secondary diagnosis:   Active Problems:   Smoker   Depression with anxiety   H/O opiate addiction   Gestational hypertension affecting first pregnancy    Discharge diagnosis: Term Pregnancy Delivered                               Postpartum procedures: Depo prior to discharge  Complications: None  Outpatient Follow-Up: [ ]  BP check [ ]  ensure follow-up for Subutex   Hospital course: Andrea [name] is a 23 y.o. [redacted]w[redacted]d who was admitted for induction of labor for gestational hypertension. Her pregnancy was complicated by above noted. Her labor course was notable for induction with pitocin and FB. She progressed to complete but had a prolonged 2nd stage of over four hours so vacuum assisted delivery offered and performed. Delivery was complicated by 3A laceration. Please see delivery/op note for additional details. Her postpartum course was uncomplicated. She was bottlefeeding. Her blood pressure was well-controlled postpartum. By day of discharge, she was passing flatus, urinating, eating and drinking without difficulty. Her pain was well-controlled, and she was discharged home with ibuprofen and stool softeners given laceartion. She will follow-up in clinic in 4-6 weeks and continue to check her blood pressures with her home BP cuff.   Physical exam  Vitals:   04/26/19 1405 04/26/19 1445 04/26/19 2301 04/27/19 0520  BP: 119/77 120/83 126/89 109/74  Pulse: 85 89 82 73  Resp: 16 18 18 16   Temp: 99.1 F (37.3 C) (!) 97 F (36.1 C) 98.1 F (36.7 C) 98.1 F (36.7 C)  TempSrc: Oral Oral  Oral  SpO2:      Weight:      Height:       General: well-appearing, NAD Lochia: appropriate Uterine Fundus:  firm Incision: N/A DVT Evaluation: No significant calf/ankle edema  Labs: Lab Results  Component Value Date   WBC 16.1 (H) 04/25/2019   HGB 11.4 (L) 04/25/2019   HCT 34.1 (L) 04/25/2019   MCV 86.5 04/25/2019   PLT 234 04/25/2019   CMP Latest Ref Rng & Units 04/24/2019  Glucose 70 - 99 mg/dL 85  BUN 6 - 20 mg/dL 8  Creatinine 3.15 - 9.45 mg/dL 8.59  Sodium 292 - 446 mmol/L 135  Potassium 3.5 - 5.1 mmol/L 4.6  Chloride 98 - 111 mmol/L 105  CO2 22 - 32 mmol/L 19(L)  Calcium 8.9 - 10.3 mg/dL 9.1  Total Protein 6.5 - 8.1 g/dL 6.1(L)  Total Bilirubin 0.3 - 1.2 mg/dL 1.0  Alkaline Phos 38 - 126 U/L 114  AST 15 - 41 U/L 33  ALT 0 - 44 U/L 14    Discharge instructions: Per After Visit Summary and "Baby and Me Booklet"  After visit meds:  Allergies as of 04/27/2019      Reactions   Banana Shortness Of Breath, Swelling, Rash   Tylenol [acetaminophen] Hives      Medication List    TAKE these medications   albuterol 108 (90 Base) MCG/ACT inhaler Commonly known as:  VENTOLIN HFA Inhale 2 puffs into the lungs every 6 (six) hours as needed for wheezing or shortness of breath.   buprenorphine 8 MG Subl SL tablet Commonly known  as:  SUBUTEX Place 4 mg under the tongue 4 (four) times daily.   docusate sodium 100 MG capsule Commonly known as:  COLACE Take 1 capsule (100 mg total) by mouth 2 (two) times daily as needed for mild constipation.   ibuprofen 800 MG tablet Commonly known as:  ADVIL Take 1 tablet (800 mg total) by mouth 3 (three) times daily.   prenatal vitamin w/FE, FA 27-1 MG Tabs tablet Take 1 tablet by mouth daily at 12 noon.      Postpartum contraception: Depo Provera Diet: Routine Diet Activity: Advance as tolerated. Pelvic rest for 6 weeks.   Follow-up Appt:No future appointments. Follow-up Visit:No follow-ups on file.  Please schedule this patient for Postpartum visit in: 4 weeks with the following provider: Any provider High risk pregnancy complicated  by: gHTN, polysubstance use on subutex Delivery mode:  Vacuum Anticipated Birth Control:  Depo PP Procedures needed: BP check  Schedule Integrated BH visit: yes  Newborn Data: Live born female  Birth Weight:  2820g APGAR: 8, 9  Newborn Delivery   Birth date/time:  04/25/2019 23:15:00 Delivery type:  Vaginal, Vacuum (Extractor)    Baby Feeding: Bottle Disposition:rooming in   PrentissLaurel S. Earlene PlaterWallace, DO OB/GYN Fellow

## 2019-04-26 NOTE — Progress Notes (Signed)
Patient states she needs a nap, has slept and hour in the last 36 hours. Denies questions or needs at this time, will call when it is a good time to assess the baby or if she needs anything. She is going to attempt to sleep now.

## 2019-04-26 NOTE — Lactation Note (Signed)
This note was copied from a baby's chart. Lactation Consultation Note  Patient Name: Girl Maurine Cradle OIZTI'W Date: 04/26/2019 Reason for consult: Follow-up assessment  1336 - 1351 - I visited Ms. Cesaro to conduct initial breast feeding education and to offer assistance. She states that she's been giving baby bottles thus far, but that she is interested in breast feeding. Mom states she has been extremely tired and wants to get some rest before she breast feeds.  Baby received 22 mls of formula at 1320. Baby was asleep. I educated on newborn infant feeding patterns, feeding frequency and duration, and feeding cues. I recommended that mom call me when baby begins to cue or when baby was due for a feeding.  Mom does not have a breast pump at home. I discussed breast feeding resources offered by [facility] and those available to her via [facility].  Maternal Data Does the patient have breastfeeding experience prior to this delivery?: No  Feeding Feeding Type: Bottle Fed - Formula   Interventions Interventions: Breast feeding basics reviewed  Lactation Tools Discussed/Used WIC Program: Yes   Consult Status Consult Status: PRN Follow-up type: Call as needed    Walker Shadow 04/26/2019, 2:22 PM

## 2019-04-26 NOTE — Progress Notes (Signed)
Called lab and asked for rpr result, they will investigate and get back to me.

## 2019-04-26 NOTE — Progress Notes (Signed)
Lab called back and the specimen was sent with the 1730 batch picked up from the courier.

## 2019-04-26 NOTE — Clinical Social Work Maternal (Signed)
CLINICAL SOCIAL WORK MATERNAL/CHILD NOTE  Patient Details  Name: Andrea [name] MRN: [id number] Date of Birth: 08/25/1996  Date:  04/26/2019  Clinical Social Worker Initiating Note:  Laurey Arrow Date/Time: Initiated:  04/26/19/1149     Child's Name:  Andrea [name]   Biological Parents:  Mother, Father   Need for Interpreter:  None   Reason for Referral:  Behavioral Health Concerns, Current Substance Use/Substance Use During Pregnancy    Address:  [address] [place], [place] [address]    Phone number:  [phone] (home)     Additional phone number:  Household Members/Support Persons (HM/SP):   Household Member/Support Person 1   HM/SP Name Relationship DOB or Age  HM/SP -1 Mason Wall FOB 08/18/1994  HM/SP -2        HM/SP -3        HM/SP -4        HM/SP -5        HM/SP -6        HM/SP -7        HM/SP -8          Natural Supports (not living in the home):  Extended Family, Immediate Family, Parent(MOB report that FOB's family will also provide supports. )   Professional Supports: Transport planner, Case Metallurgist   Employment: Unemployed   Type of Work:     Education:  Programmer, systems   Homebound arranged:    Museum/gallery curator Resources:      Other Resources:  Physicist, medical , [facility]   Cultural/Religious Considerations Which May Impact Care:   Per MOB's Face Sheet, MOB is Non-Denominational  Strengths:  Ability to meet basic needs , Compliance with medical plan , Home prepared for child , Psychotropic Medications, Understanding of illness, Pediatrician chosen   Psychotropic Medications:  Subutex      Pediatrician:    [facility]  Pediatrician List:   [place]      Pediatrician Fax Number:    Risk Factors/Current Problems:  Mental Health Concerns , Substance Use    Cognitive State:  Alert , Able to Concentrate ,  Goal Oriented , Insightful , Linear Thinking    Mood/Affect:  Interested , Happy , Comfortable    CSW Assessment: CSW met with MOB in room 516 to complete an assessment for MH hx and SA hx.  When CSW arrived, infant was asleep in bassinet, FOB was sleep on the couch and MOB was resting in bed. CSW explained CSW's role and MOB agreed to have CSW complete the assessment while FOB was present. FOB was easily awaken however did not participate with CSW.  MOB was polite, appeared forthcoming, and receptive to meeting with CSW.  CSW asked about MOB's MH hx and MOB acknowledged a hx of anxiety/depression. MOB reported that MOB was dx around 2010 and has managed her symptoms without medication. CSW provided education regarding the baby blues period vs. perinatal mood disorders, discussed treatment and gave resources for mental health follow up if concerns arise.  CSW recommends self-evaluation during the postpartum time period using the New Mom Checklist from Postpartum Progress and encouraged MOB to contact a medical professional if symptoms are noted at any time.  MOB did not present with any acute symptoms and presented with insight and awareness. MOB shared she has the support of FOB and some of her immediate  and extended family members. CSW also shared feeling comfortable seeking help if help is needed. CSW assessed for safety and MOB denied SI and HI.   CSW asked about MOB's SA hx and MOB was proud to shared her sobriety.  MOB reported currently taking Subutex (Dr. Valetta Close in [place] manages her medication) and communicated that her medication and managing her withdrawal symptoms.  CSW explained hospital's substance exposure policy and MOB appeared un bothered. MOB express feeling confident that infant's screens will be negative. CSW will monitor infant's UDS and CDS and will make a report to [facility] CPS if warranted.    CSW provided review of Sudden Infant Death Syndrome (SIDS) precautions.    CSW identifies no further need for intervention and no barriers to discharge at this time.   CSW Plan/Description:  No Further Intervention Required/No Barriers to Discharge, Sudden Infant Death Syndrome (SIDS) Education, Perinatal Mood and Anxiety Disorder (PMADs) Education, Neonatal Abstinence Syndrome (NAS) Education, Other Patient/Family Education, [place], Other Information/Referral to [employer], CSW Will Continue to Monitor Umbilical Cord Tissue Drug Screen Results and Make Report if Warranted   Laurey Arrow, MSW, LCSW Clinical Social Work [phone]   Dimple Nanas, [place] 04/26/2019, 1:53 PM

## 2019-04-27 LAB — SYPHILIS: RPR W/REFLEX TO RPR TITER AND TREPONEMAL ANTIBODIES, TRADITIONAL SCREENING AND DIAGNOSIS ALGORITHM: RPR Ser Ql: NONREACTIVE

## 2019-04-27 MED ORDER — IBUPROFEN 800 MG PO TABS: 800.0000 mg | ORAL_TABLET | Freq: Three times a day (TID) | ORAL | 0 refills | Status: DC

## 2019-04-27 MED ORDER — ALBUTEROL SULFATE HFA 108 (90 BASE) MCG/ACT IN AERS: 2.0000 | INHALATION_SPRAY | Freq: Four times a day (QID) | RESPIRATORY_TRACT | 0 refills | Status: AC | PRN

## 2019-04-27 MED ORDER — DOCUSATE SODIUM 100 MG PO CAPS: 100.0000 mg | ORAL_CAPSULE | Freq: Two times a day (BID) | ORAL | 0 refills | Status: DC | PRN

## 2019-04-27 NOTE — Lactation Note (Addendum)
This note was copied from a baby's chart. Lactation Consultation Note  Patient Name: Andrea [name] MBWGY'K Date: 04/27/2019 Reason for consult: Follow-up assessment;Infant weight loss;Early term 71-38.6wks  36 hours old early term female who is being partially BF and formula fed by his mother, she's a P1. Mom and baby are going home today. Reviewed discharge instructions, engorgement prevention and treatment, treatment/prevention of sore nipples and red flags on when to call baby's pediatrician. Baby sucking on a pacifier when entering the room, educated parents about risks of introducing pacifiers early on and how they can hurt breastfeeding; baby is at 6% weight loss. Per mom BF is going well despite the fact that she has flat nipples. She's taking her shells and the NS home, she says that baby won't latch on without it, but she does pretty well with it, praised her for her efforts. Parents reported all questions and concerns were answered, they're both aware of LC OP services and will call contact if needed.  Maternal Data    Feeding    Interventions Interventions: Breast feeding basics reviewed  Lactation Tools Discussed/Used     Consult Status Consult Status: Complete Date: 04/27/19 Follow-up type: Call as needed    Tyshaun Vinzant Venetia Constable 04/27/2019, 11:19 AM

## 2019-04-28 ENCOUNTER — Ambulatory Visit: Payer: Self-pay

## 2019-04-28 NOTE — Lactation Note (Addendum)
This note was copied from a baby's chart. Lactation Consultation Note  Patient Name: Girl Suriyah Bernthal NMMHW'K Date: 04/28/2019 Reason for consult: Follow-up assessment;Primapara;Early term 37-38.6wks;Other (Comment)(eat, sleep, console) Baby is 58 hours old/9% weight loss.  Baby is breastfeeding with a nipple shield.  Mom is supplementing with formula but not after every feeding.  Observed mom latch baby with a 16 mm nipple shield using cross cradle hold.  Baby active at breast and shield full of milk after feeding.  Breasts are filling. Discussed feeding plan with mom.  Instructed to put baby to breast with feeding cues, post pump every 3 hours and supplement with expressed breast milk/formula until baby is satiated.  Symphony pump set up with instructions.  Encouraged to call for assist prn.  WIC referral faxed to [facility] for breast pump.  Maternal Data    Feeding Feeding Type: Breast Fed  LATCH Score Latch: Grasps breast easily, tongue down, lips flanged, rhythmical sucking.(with nipple shield)  Audible Swallowing: Spontaneous and intermittent  Type of Nipple: Everted at rest and after stimulation  Comfort (Breast/Nipple): Soft / non-tender  Hold (Positioning): No assistance needed to correctly position infant at breast.  LATCH Score: 10  Interventions    Lactation Tools Discussed/Used Tools: Nipple Shields Nipple shield size: 16 Pump Review: Setup, frequency, and cleaning;Milk Storage Initiated by:: LMoulden Date initiated:: 04/28/19   Consult Status Consult Status: Follow-up Date: 04/29/19 Follow-up type: In-patient    Huston Foley 04/28/2019, 10:03 AM

## 2019-04-29 ENCOUNTER — Ambulatory Visit: Payer: Self-pay

## 2019-04-29 NOTE — Lactation Note (Signed)
This note was copied from a baby's chart. Lactation Consultation Note  Patient Name: Andrea [name] Today's Date: 04/29/2019   Infant has only lost 25 g despite having 7 voids & 7 stools between having been weighed yesterday morning and this morning.   Mom has been using the Enfamil slow-flow nipple which she thinks is too fast. She says that she sees formula coming out of the side of infant's mouth & it sounds as if infant's swallows sound harsh.   The Similac slow-flow nipple was provided. Mom will call me to observe next bottle feeding.   [name] [facility] 04/29/2019, 12:55 PM

## 2019-04-29 NOTE — Lactation Note (Signed)
This note was copied from a baby's chart. Lactation Consultation Note  Patient Name: Andrea [name] Today's Date: 04/29/2019   Mom reports that infant did better with the Similac slow-flow nipple. I also fed infant with bottle after her diaper change. Upright bottle-feeding was discussed with parents and signs of appropriate bottle teat flow: soft swallows, milk not coming out of the side of infant's mouth, etc. Chin support was also demonstrated, if needed.   Mom reports that she is pumping q3h, but not getting much yield at this time. Mom encouraged to continue pumping, as it should make a difference within a couple of days. Mom reports + breast changes w/pregnancy.     [name] [facility] 04/29/2019, 1:59 PM

## 2019-05-01 ENCOUNTER — Ambulatory Visit: Payer: Self-pay

## 2019-05-01 NOTE — Lactation Note (Addendum)
This note was copied from a baby's chart. Lactation Consultation Note  Patient Name: Andrea [name] [id number] Date: 05/01/2019 Reason for consult: Follow-up assessment  Infant is 67 days old & Mom's milk has come to volume. She has been able to pump 2 oz/session when pumping, but recently only got 1 oz. I suggested that Mom use warm, moist heat to breasts to help with flow. Extra wash cloths were provided. Mom has been using size 24 flanges, but I provided her with size 21 flanges to use, instead (for her smaller nipple diameter).   Mom asked for another nipple shield. I provided her with a size 20 nipple shield instead (easier to compress).   Mom says infant has continued to feed well with the yellow Similac nipple. Infant was cueing to feed. I offered to stay to assist with breastfeeding, but Mom declined. Mom will offer the breast and then offer bottle.   Infant has gained 35g despite multiple voids (14) and stools (8).  [name] [id number] [facility] 05/01/2019, 9:26 AM

## 2019-05-02 ENCOUNTER — Ambulatory Visit: Payer: Medicaid Other | Admitting: *Deleted

## 2019-05-02 ENCOUNTER — Other Ambulatory Visit: Payer: Self-pay

## 2019-05-02 VITALS — BP 134/83

## 2019-05-02 DIAGNOSIS — Z0130 Encounter for examination of blood pressure without abnormal findings: Secondary | ICD-10-CM

## 2019-05-02 NOTE — Progress Notes (Signed)
Pt did nurse visit as tele visit because she was unable to come in today. Reports that she is doing well. Denies any headaches or blurry vision. Advised patient to keep her PP visit.

## 2019-06-02 ENCOUNTER — Ambulatory Visit (INDEPENDENT_AMBULATORY_CARE_PROVIDER_SITE_OTHER): Payer: Medicaid Other | Admitting: Women's Health

## 2019-06-02 ENCOUNTER — Encounter: Payer: Self-pay | Admitting: Women's Health

## 2019-06-02 ENCOUNTER — Other Ambulatory Visit: Payer: Self-pay

## 2019-06-02 DIAGNOSIS — A749 Chlamydial infection, unspecified: Secondary | ICD-10-CM | POA: Insufficient documentation

## 2019-06-02 MED ORDER — MEDROXYPROGESTERONE ACETATE 150 MG/ML IM SUSP: 150.0000 mg | INTRAMUSCULAR | 3 refills | Status: DC

## 2019-06-02 NOTE — Progress Notes (Signed)
TELEHEALTH VIRTUAL POSTPARTUM VISIT ENCOUNTER NOTE Patient name: Andrea [name] MRN [id number]  Date of birth: [birth year]  I connected with patient on 06/02/19 at  1:30 PM EDT by telephone (unable to do Webex) and verified that I am speaking with the correct person using two identifiers. Due to COVID-19 recommendations, pt is not currently in our office.    I discussed the limitations, risks, security and privacy concerns of performing an evaluation and management service by telephone and the availability of in person appointments. I also discussed with the patient that there may be a patient responsible charge related to this service. The patient expressed understanding and agreed to proceed.  Chief Complaint:   Postpartum Care  History of Present Illness:   Andrea [name] CATARINO is a 23 y.o. G62P1001 Caucasian female being evaluated today for a postpartum visit. She is 5 weeks postpartum following a vacuum, low at 38.3 gestational weeks after IOL for GHTN. Anesthesia: epidural. Laceration: 3rd degree- no pain/or any problems. I have fully reviewed the prenatal and intrapartum course. Pregnancy complicated by GHTN. Reports home bp's have been normal. +CT on 04/15/19, pt/partner tx.  Postpartum course has been uncomplicated. Bleeding thin lochia. Bowel function is normal. Bladder function is normal.  Patient is not sexually active. Last sexual activity: prior to birth of baby.  Contraception method is received depo in hospital; wants to talk to her boyfriend to about other methods.  Last pap 10/28/18.  Results were normal .  Patient's last menstrual period was 08/16/2018 (approximate).  Baby's course has been uncomplicated. Baby is feeding by breast x 4wks, now bottle.   Edinburgh Postpartum Depression Screening: negative Edinburgh Postnatal Depression Scale - 06/02/19 1337      Edinburgh Postnatal Depression Scale:  In the Past 7 Days   I have been able to laugh and see the funny side of  things.  0    I have looked forward with enjoyment to things.  0    I have blamed myself unnecessarily when things went wrong.  0    I have been anxious or worried for no good reason.  0    I have felt scared or panicky for no good reason.  0    Things have been getting on top of me.  0    I have been so unhappy that I have had difficulty sleeping.  0    I have felt sad or miserable.  0    I have been so unhappy that I have been crying.  0    The thought of harming myself has occurred to me.  0    Edinburgh Postnatal Depression Scale Total  0      Review of Systems:   Pertinent items are noted in HPI Denies Abnormal vaginal discharge w/ itching/odor/irritation, headaches, visual changes, shortness of breath, chest pain, abdominal pain, severe nausea/vomiting, or problems with urination or bowel movements. Pertinent History Reviewed:  Reviewed past medical,surgical, obstetrical and family history.  Reviewed problem list, medications and allergies. OB History  Gravida Para Term Preterm AB Living  1 1 1  0 0 1  SAB TAB Ectopic Multiple Live Births  0 0 0 0 1    # Outcome Date GA Lbr Len/2nd Weight Sex Delivery Anes PTL Lv  1 Term 04/25/19 [redacted]w[redacted]d 04:15 / 04:20 6 lb 3.5 oz (2.82 kg) F Vag-Vacuum EPI  LIV   Physical Assessment:   Vitals:   06/02/19 1349  BP: 137/90  There is no  height or weight on file to calculate BMI.       Physical Examination:  General:  Alert, oriented and cooperative.   Mental Status: Normal mood and affect perceived. Normal judgment and thought content.  Rest of physical exam deferred due to type of encounter       No results found for this or any previous visit (from the past 24 hour(s)).  Assessment & Plan:  1) Postpartum exam 2) 5 wks s/p VAVB after IOL for GHTN, 3rd degree lac 3) Bottlefeeding 4) Depression screening 5) Contraception counseling, depo in hospital on 04/27/19, wants to continue for now, may switch- to let us know 6) Elevated home bp>  come tomorrow for bp check w/ nurse 7) +CT in May> do POC tomorrow when comes for bp check 8) H/O abnormal pap> repeat in Nov  Meds:  Meds ordered this encounter  Medications  . medroxyPROGESTERone (DEPO-PROVERA) 150 MG/ML injection    Sig: Inject 1 mL (150 mg total) into the muscle every 3 (three) months.    Dispense:  1 mL    Refill:  3    Order Specific Question:   Supervising Provider    Answer:   Duane LopeEURE, LUTHER H [[id number]]    I discussed the assessment and treatment plan with the patient. The patient was provided an opportunity to ask questions and all were answered. The patient agreed with the plan and demonstrated an understanding of the instructions.   The patient was advised to call back or seek an in-person evaluation/go to the ED for any concerning postpartum symptoms.  I provided 15 minutes of non-face-to-face time during this encounter.  Follow-up: Return for tomorrow for bp check & CT POC w/ nurse; then 8/3 for depo; then after 11/18 for pap & physical.   No orders of the defined types were placed in this encounter.   Cheral MarkerKimberly R Yakov Bergen CNM, [facility] 06/02/2019 2:07 PM

## 2019-06-03 ENCOUNTER — Other Ambulatory Visit: Payer: Self-pay | Admitting: Women's Health

## 2019-06-03 ENCOUNTER — Ambulatory Visit: Payer: Medicaid Other

## 2019-06-03 ENCOUNTER — Other Ambulatory Visit: Payer: Self-pay

## 2019-06-03 VITALS — BP 131/90 | HR 74 | Ht 59.0 in | Wt 169.0 lb

## 2019-06-03 DIAGNOSIS — Z0130 Encounter for examination of blood pressure without abnormal findings: Secondary | ICD-10-CM | POA: Insufficient documentation

## 2019-06-03 DIAGNOSIS — Z1389 Encounter for screening for other disorder: Secondary | ICD-10-CM

## 2019-06-03 MED ORDER — AMLODIPINE BESYLATE 5 MG PO TABS: 5.0000 mg | ORAL_TABLET | Freq: Every day | ORAL | 3 refills | Status: DC

## 2019-06-03 NOTE — Progress Notes (Signed)
Pt here for bp check, 131/90. 5wks pp. Now stating she had high bp prior to pregnancy and was on meds for it then, first we are hearing of this. She did have GHTN dx @ 37.5wks this pregnancy. Bottlefeeding. Rx norvasc 5mg  daily, f/u in 2wks for bp check, stop taking 2d prior to visit.  Roma Schanz, CNM, [facility] 06/03/2019 11:14 AM

## 2019-06-03 NOTE — Progress Notes (Signed)
Pt here for blood pressure check 131/90 pulse 74. Spoke with kim booker about reading.Andrea [name] will start her on b/p medication.stop medication 2 days before coming in for blood pressure in  Check 2 weeks. Pad CMA

## 2019-06-05 LAB — URINE CULTURE

## 2019-07-14 ENCOUNTER — Ambulatory Visit (INDEPENDENT_AMBULATORY_CARE_PROVIDER_SITE_OTHER): Payer: Medicaid Other | Admitting: *Deleted

## 2019-07-14 ENCOUNTER — Encounter: Payer: Self-pay | Admitting: *Deleted

## 2019-07-14 ENCOUNTER — Other Ambulatory Visit: Payer: Self-pay

## 2019-07-14 ENCOUNTER — Ambulatory Visit: Payer: Medicaid Other

## 2019-07-14 VITALS — BP 120/81 | HR 83

## 2019-07-14 DIAGNOSIS — Z3042 Encounter for surveillance of injectable contraceptive: Secondary | ICD-10-CM

## 2019-07-14 DIAGNOSIS — Z308 Encounter for other contraceptive management: Secondary | ICD-10-CM

## 2019-07-14 DIAGNOSIS — Z3202 Encounter for pregnancy test, result negative: Secondary | ICD-10-CM | POA: Diagnosis not present

## 2019-07-14 LAB — POCT URINE PREGNANCY: Preg Test, Ur: NEGATIVE

## 2019-07-14 MED ORDER — MEDROXYPROGESTERONE ACETATE 150 MG/ML IM SUSP
150.0000 mg | Freq: Once | INTRAMUSCULAR | Status: AC
  Administered 2019-07-14: 150 mg via INTRAMUSCULAR

## 2019-07-14 NOTE — Progress Notes (Signed)
Pt here for Depo. Pt received shot in right deltoid. Pt tolerated shot well. Pt ran out of BP med 2 days ago. BP today was 120/81. I spoke with Maudie Mercury, CNM and she advised pt don't need to take BP med and can return our BP cuff. Pt voiced understanding. [place]

## 2019-10-06 ENCOUNTER — Other Ambulatory Visit: Payer: Self-pay

## 2019-10-06 ENCOUNTER — Ambulatory Visit (INDEPENDENT_AMBULATORY_CARE_PROVIDER_SITE_OTHER): Payer: Medicaid Other

## 2019-10-06 DIAGNOSIS — Z3042 Encounter for surveillance of injectable contraceptive: Secondary | ICD-10-CM

## 2019-10-06 MED ORDER — MEDROXYPROGESTERONE ACETATE 150 MG/ML IM SUSP
150.0000 mg | Freq: Once | INTRAMUSCULAR | Status: AC
  Administered 2019-10-06: 150 mg via INTRAMUSCULAR

## 2019-10-06 NOTE — Progress Notes (Signed)
   NURSE VISIT- INJECTION  SUBJECTIVE:  Andrea [name] is a 23 y.o. G55P1001 female here for a Depo Provera for contraception/period management. She is a GYN patient.   OBJECTIVE:  There were no vitals taken for this visit.  Appears well, in no apparent distress  Injection administered in: Left deltoid  Meds ordered this encounter  Medications  . medroxyPROGESTERone (DEPO-PROVERA) injection 150 mg    ASSESSMENT: GYN patient Depo Provera for contraception/period management  PLAN: Follow-up: in 11-13 weeks for next Depo   Ladonna Snide  10/06/2019 2:29 PM

## 2019-12-29 ENCOUNTER — Other Ambulatory Visit: Payer: Self-pay

## 2019-12-29 ENCOUNTER — Ambulatory Visit (INDEPENDENT_AMBULATORY_CARE_PROVIDER_SITE_OTHER): Payer: Medicaid Other

## 2019-12-29 VITALS — Ht 59.0 in | Wt 185.5 lb

## 2019-12-29 DIAGNOSIS — Z3042 Encounter for surveillance of injectable contraceptive: Secondary | ICD-10-CM | POA: Diagnosis not present

## 2019-12-29 MED ORDER — MEDROXYPROGESTERONE ACETATE 150 MG/ML IM SUSP
150.0000 mg | Freq: Once | INTRAMUSCULAR | Status: AC
  Administered 2019-12-29: 150 mg via INTRAMUSCULAR

## 2019-12-29 NOTE — Progress Notes (Signed)
   NURSE VISIT- INJECTION  SUBJECTIVE:  Andrea [name] is a 24 y.o. G64P1001 female here for a Depo Provera for contraception/period management. She is a GYN patient.   OBJECTIVE:  Ht 4\' 11"  (1.499 m)   Wt 185 lb 8 oz (84.1 kg)   BMI 37.47 kg/m   Appears well, in no apparent distress  Injection administered in: Right deltoid  Meds ordered this encounter  Medications  . medroxyPROGESTERone (DEPO-PROVERA) injection 150 mg    ASSESSMENT: GYN patient Depo Provera for contraception/period management PLAN: Follow-up: in 11-13 weeks for next Depo   01-28-1971  12/29/2019 2:59 PM

## 2020-03-15 ENCOUNTER — Ambulatory Visit: Payer: Medicaid Other
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
## 2020-03-24 ENCOUNTER — Encounter: Payer: Self-pay | Admitting: *Deleted

## 2020-03-24 ENCOUNTER — Other Ambulatory Visit: Payer: Self-pay

## 2020-03-24 ENCOUNTER — Ambulatory Visit (INDEPENDENT_AMBULATORY_CARE_PROVIDER_SITE_OTHER): Payer: Medicaid Other | Admitting: *Deleted

## 2020-03-24 DIAGNOSIS — Z3042 Encounter for surveillance of injectable contraceptive: Secondary | ICD-10-CM

## 2020-03-24 MED ORDER — MEDROXYPROGESTERONE ACETATE 150 MG/ML IM SUSP
150.0000 mg | Freq: Once | INTRAMUSCULAR | Status: AC
  Administered 2020-03-24: 11:00:00 150 mg via INTRAMUSCULAR

## 2020-03-24 NOTE — Progress Notes (Signed)
   NURSE VISIT- INJECTION  SUBJECTIVE:  Andrea [name] is a 24 y.o. G99P1001 female here for a Depo Provera for contraception/period management. She is a GYN patient.   OBJECTIVE:  There were no vitals taken for this visit.  Appears well, in no apparent distress  Injection administered in: Left deltoid  Meds ordered this encounter  Medications  . medroxyPROGESTERone (DEPO-PROVERA) injection 150 mg    ASSESSMENT: GYN patient Depo Provera for contraception/period management PLAN: Follow-up: in 11-13 weeks for next Depo   Jobe Marker  03/24/2020 11:00 AM

## 2020-05-17 ENCOUNTER — Ambulatory Visit: Payer: Medicaid Other

## 2020-05-28 ENCOUNTER — Ambulatory Visit: Payer: Medicaid Other

## 2020-06-14 ENCOUNTER — Other Ambulatory Visit: Payer: Self-pay | Admitting: Women's Health

## 2020-06-16 ENCOUNTER — Ambulatory Visit: Payer: Medicaid Other

## 2020-06-17 ENCOUNTER — Ambulatory Visit (INDEPENDENT_AMBULATORY_CARE_PROVIDER_SITE_OTHER): Payer: Medicaid Other | Admitting: *Deleted

## 2020-06-17 DIAGNOSIS — Z3042 Encounter for surveillance of injectable contraceptive: Secondary | ICD-10-CM

## 2020-06-17 MED ORDER — MEDROXYPROGESTERONE ACETATE 150 MG/ML IM SUSP
150.0000 mg | Freq: Once | INTRAMUSCULAR | Status: AC
  Administered 2020-06-17: 150 mg via INTRAMUSCULAR

## 2020-06-17 NOTE — Progress Notes (Signed)
   NURSE VISIT- INJECTION  SUBJECTIVE:  Andrea [name] is a 24 y.o. G31P1001 female here for a Depo Provera for contraception/period management. She is a GYN patient.   OBJECTIVE:  There were no vitals taken for this visit.  Appears well, in no apparent distress  Injection administered in: Right deltoid  Meds ordered this encounter  Medications  . medroxyPROGESTERone (DEPO-PROVERA) injection 150 mg    ASSESSMENT: GYN patient Depo Provera for contraception/period management PLAN: Follow-up: in 11-13 weeks for next Depo   Stoney Bang  06/17/2020 11:12 AM

## 2020-09-02 ENCOUNTER — Ambulatory Visit: Payer: Medicaid Other

## 2020-09-06 ENCOUNTER — Other Ambulatory Visit: Payer: Self-pay | Admitting: Women's Health

## 2020-09-08 ENCOUNTER — Ambulatory Visit: Payer: Medicaid Other

## 2020-09-09 ENCOUNTER — Ambulatory Visit (INDEPENDENT_AMBULATORY_CARE_PROVIDER_SITE_OTHER): Payer: Medicaid Other | Admitting: *Deleted

## 2020-09-09 ENCOUNTER — Other Ambulatory Visit: Payer: Self-pay

## 2020-09-09 DIAGNOSIS — Z3042 Encounter for surveillance of injectable contraceptive: Secondary | ICD-10-CM | POA: Diagnosis not present

## 2020-09-09 MED ORDER — MEDROXYPROGESTERONE ACETATE 150 MG/ML IM SUSP
150.0000 mg | Freq: Once | INTRAMUSCULAR | Status: AC
  Administered 2020-09-09: 150 mg via INTRAMUSCULAR

## 2020-09-09 NOTE — Progress Notes (Signed)
° °  NURSE VISIT- INJECTION  SUBJECTIVE:  Andrea [name] is a 24 y.o. G48P1001 female here for a Depo Provera for contraception/period management. She is a GYN patient.   OBJECTIVE:  There were no vitals taken for this visit.  Appears well, in no apparent distress  Injection administered in: Left deltoid  Meds ordered this encounter  Medications   medroxyPROGESTERone (DEPO-PROVERA) injection 150 mg    ASSESSMENT: GYN patient Depo Provera for contraception/period management PLAN: Follow-up: in 11-13 weeks for next Depo   Nance Pear  09/09/2020 9:28 AM

## 2020-11-23 ENCOUNTER — Other Ambulatory Visit: Payer: Self-pay | Admitting: Women's Health

## 2020-11-25 ENCOUNTER — Ambulatory Visit: Payer: Medicaid Other

## 2020-11-30 ENCOUNTER — Ambulatory Visit: Payer: Medicaid Other

## 2021-01-05 DIAGNOSIS — F1120 Opioid dependence, uncomplicated: Secondary | ICD-10-CM | POA: Diagnosis not present

## 2021-01-05 DIAGNOSIS — Z331 Pregnant state, incidental: Secondary | ICD-10-CM | POA: Diagnosis not present

## 2021-01-24 DIAGNOSIS — Z331 Pregnant state, incidental: Secondary | ICD-10-CM | POA: Diagnosis not present

## 2021-01-24 DIAGNOSIS — F1120 Opioid dependence, uncomplicated: Secondary | ICD-10-CM | POA: Diagnosis not present

## 2021-02-14 DIAGNOSIS — Z331 Pregnant state, incidental: Secondary | ICD-10-CM | POA: Diagnosis not present

## 2021-02-14 DIAGNOSIS — F419 Anxiety disorder, unspecified: Secondary | ICD-10-CM | POA: Diagnosis not present

## 2021-02-14 DIAGNOSIS — F1120 Opioid dependence, uncomplicated: Secondary | ICD-10-CM | POA: Diagnosis not present

## 2021-03-01 DIAGNOSIS — F1120 Opioid dependence, uncomplicated: Secondary | ICD-10-CM | POA: Diagnosis not present

## 2021-03-01 DIAGNOSIS — Z331 Pregnant state, incidental: Secondary | ICD-10-CM | POA: Diagnosis not present

## 2021-03-24 DIAGNOSIS — Z331 Pregnant state, incidental: Secondary | ICD-10-CM | POA: Diagnosis not present

## 2021-03-24 DIAGNOSIS — F1120 Opioid dependence, uncomplicated: Secondary | ICD-10-CM | POA: Diagnosis not present

## 2021-04-26 DIAGNOSIS — F1120 Opioid dependence, uncomplicated: Secondary | ICD-10-CM | POA: Diagnosis not present

## 2021-04-26 DIAGNOSIS — Z331 Pregnant state, incidental: Secondary | ICD-10-CM | POA: Diagnosis not present

## 2021-05-30 ENCOUNTER — Telehealth: Payer: Self-pay

## 2021-05-30 NOTE — Telephone Encounter (Signed)
..   Medicaid Managed Care   Unsuccessful Outreach Note  05/30/2021 Name: Andrea [name] MRN: [id number] DOB: 09/13/1996  Referred by: [facility] for referral : High Risk Managed Medicaid (Attempted to reach [name] today to get her scheduled with the [facility] team for a phone visit. I left a message on her VM.)   An unsuccessful telephone outreach was attempted today. The patient was referred to the case management team for assistance with care management and care coordination.   Follow Up Plan: The care management team will reach out to the patient again over the next 7-14 days.   Weston Settle Care Guide, High Risk Medicaid Managed Care Embedded Care Coordination [facility]  [facility]

## 2021-06-03 DIAGNOSIS — F1120 Opioid dependence, uncomplicated: Secondary | ICD-10-CM | POA: Diagnosis not present

## 2021-06-03 DIAGNOSIS — Z331 Pregnant state, incidental: Secondary | ICD-10-CM | POA: Diagnosis not present

## 2021-06-17 ENCOUNTER — Telehealth: Payer: Self-pay

## 2021-06-17 NOTE — Telephone Encounter (Signed)
..   Medicaid Managed Care   Unsuccessful Outreach Note  06/17/2021 Name: Andrea [name] MRN: [id number] DOB: 04-22-1996  Referred by: [facility] for referral : High Risk Managed Medicaid (Called patient to get her scheduled for a phone visit with the Managed Medicaid Team. I left my name and number on her Voicemail.)   A second unsuccessful telephone outreach was attempted today. The patient was referred to the case management team for assistance with care management and care coordination.   Follow Up Plan: The care management team will reach out to the patient again over the next 7-14 days.   Weston Settle Care Guide, High Risk Medicaid Managed Care Embedded Care Coordination [facility]  [facility]

## 2021-06-29 ENCOUNTER — Telehealth: Payer: Self-pay

## 2021-06-29 NOTE — Telephone Encounter (Signed)
..   Medicaid Managed Care   Unsuccessful Outreach Note  06/29/2021 Name: Andrea [name] MRN: [id number] DOB: 1996-05-31  Referred by: [facility] for referral : High Risk Managed Medicaid (Attempted to reach the patient today today to get her scheduled with the MM Team. I left a message with my name and number on her VM.)   Third unsuccessful telephone outreach was attempted today. The patient was referred to the case management team for assistance with care management and care coordination. The patient's primary care provider has been notified of our unsuccessful attempts to make or maintain contact with the patient. The care management team is pleased to engage with this patient at any time in the future should he/she be interested in assistance from the care management team.   Follow Up Plan: We have been unable to make contact with the patient for follow up. The care management team is available to follow up with the patient after provider conversation with the patient regarding recommendation for care management engagement and subsequent re-referral to the care management team.   Weston Settle Care Guide, High Risk Medicaid Managed Care Embedded Care Coordination [facility]  [facility]

## 2021-07-07 DIAGNOSIS — F1120 Opioid dependence, uncomplicated: Secondary | ICD-10-CM | POA: Diagnosis not present

## 2021-07-07 DIAGNOSIS — Z331 Pregnant state, incidental: Secondary | ICD-10-CM | POA: Diagnosis not present

## 2021-08-31 DIAGNOSIS — F1120 Opioid dependence, uncomplicated: Secondary | ICD-10-CM | POA: Diagnosis not present

## 2021-08-31 DIAGNOSIS — G4700 Insomnia, unspecified: Secondary | ICD-10-CM | POA: Diagnosis not present

## 2021-08-31 DIAGNOSIS — F419 Anxiety disorder, unspecified: Secondary | ICD-10-CM | POA: Diagnosis not present

## 2021-11-30 ENCOUNTER — Other Ambulatory Visit: Payer: Self-pay

## 2021-11-30 ENCOUNTER — Ambulatory Visit (INDEPENDENT_AMBULATORY_CARE_PROVIDER_SITE_OTHER): Payer: BC Managed Care – PPO

## 2021-11-30 VITALS — BP 128/79 | HR 100 | Ht 59.0 in | Wt 187.6 lb

## 2021-11-30 DIAGNOSIS — Z3201 Encounter for pregnancy test, result positive: Secondary | ICD-10-CM

## 2021-11-30 DIAGNOSIS — Z3200 Encounter for pregnancy test, result unknown: Secondary | ICD-10-CM

## 2021-11-30 LAB — POCT URINE PREGNANCY: Preg Test, Ur: POSITIVE — AB

## 2021-11-30 NOTE — Progress Notes (Addendum)
° °  NURSE VISIT- PREGNANCY CONFIRMATION   SUBJECTIVE:  Andrea [name] is a 25 y.o. G41P1001 female at [redacted]w[redacted]d by certain LMP of Patient's last menstrual period was 10/23/2021 (approximate). Here for pregnancy confirmation.  Home pregnancy test: positive x 7   She reports nausea.  She is not taking prenatal vitamins.    OBJECTIVE:  BP 128/79 (BP Location: Right Arm, Patient Position: Sitting, Cuff Size: Large)    Pulse 100    Ht 4\' 11"  (1.499 m)    Wt 187 lb 9.6 oz (85.1 kg)    LMP 10/23/2021 (Approximate)    BMI 37.89 kg/m   Appears well, in no apparent distress  Results for orders placed or performed in visit on 11/30/21 (from the past 24 hour(s))  POCT urine pregnancy   Collection Time: 11/30/21  2:36 PM  Result Value Ref Range   Preg Test, Ur Positive (A) Negative    ASSESSMENT: Positive pregnancy test, [redacted]w[redacted]d by LMP    PLAN: Schedule for dating ultrasound in 2-4 weeks Prenatal vitamins: plans to begin OTC ASAP   Nausea medicines: not currently needed   OB packet given: Yes  Fox Salminen A Amaka Gluth  11/30/2021 2:38 PM   Chart reviewed for nurse visit. Agree with plan of care.  12/02/2021, DO 12/03/2021 11:49 AM

## 2021-12-06 DIAGNOSIS — F39 Unspecified mood [affective] disorder: Secondary | ICD-10-CM | POA: Diagnosis not present

## 2021-12-06 DIAGNOSIS — Z3A01 Less than 8 weeks gestation of pregnancy: Secondary | ICD-10-CM | POA: Diagnosis not present

## 2021-12-06 DIAGNOSIS — O99891 Other specified diseases and conditions complicating pregnancy: Secondary | ICD-10-CM | POA: Diagnosis not present

## 2021-12-06 DIAGNOSIS — Z13220 Encounter for screening for lipoid disorders: Secondary | ICD-10-CM | POA: Diagnosis not present

## 2021-12-06 DIAGNOSIS — Z1159 Encounter for screening for other viral diseases: Secondary | ICD-10-CM | POA: Diagnosis not present

## 2021-12-06 DIAGNOSIS — R5383 Other fatigue: Secondary | ICD-10-CM | POA: Diagnosis not present

## 2021-12-06 DIAGNOSIS — Z6838 Body mass index (BMI) 38.0-38.9, adult: Secondary | ICD-10-CM | POA: Diagnosis not present

## 2021-12-06 DIAGNOSIS — Z136 Encounter for screening for cardiovascular disorders: Secondary | ICD-10-CM | POA: Diagnosis not present

## 2021-12-20 ENCOUNTER — Other Ambulatory Visit: Payer: Self-pay | Admitting: Obstetrics & Gynecology

## 2021-12-20 DIAGNOSIS — O3680X0 Pregnancy with inconclusive fetal viability, not applicable or unspecified: Secondary | ICD-10-CM

## 2021-12-21 ENCOUNTER — Other Ambulatory Visit: Payer: Self-pay

## 2021-12-21 ENCOUNTER — Ambulatory Visit (INDEPENDENT_AMBULATORY_CARE_PROVIDER_SITE_OTHER): Payer: BC Managed Care – PPO

## 2021-12-21 DIAGNOSIS — Z3A08 8 weeks gestation of pregnancy: Secondary | ICD-10-CM

## 2021-12-21 DIAGNOSIS — O3680X0 Pregnancy with inconclusive fetal viability, not applicable or unspecified: Secondary | ICD-10-CM

## 2021-12-21 NOTE — Progress Notes (Signed)
[place] 7+3 wks,single IUP with yolk sac,CRL 12.09 mm,FHR 136 bpm,normal right ovary,simple left ovarian cyst 4.5 x 4 x 4 cm

## 2022-01-26 ENCOUNTER — Other Ambulatory Visit: Payer: Self-pay | Admitting: Obstetrics & Gynecology

## 2022-01-26 ENCOUNTER — Ambulatory Visit: Payer: BC Managed Care – PPO | Admitting: *Deleted

## 2022-01-26 DIAGNOSIS — Z3682 Encounter for antenatal screening for nuchal translucency: Secondary | ICD-10-CM

## 2022-01-26 DIAGNOSIS — Z3490 Encounter for supervision of normal pregnancy, unspecified, unspecified trimester: Secondary | ICD-10-CM | POA: Insufficient documentation

## 2022-01-27 ENCOUNTER — Other Ambulatory Visit: Payer: Self-pay | Admitting: Obstetrics & Gynecology

## 2022-01-27 ENCOUNTER — Ambulatory Visit (INDEPENDENT_AMBULATORY_CARE_PROVIDER_SITE_OTHER): Payer: BC Managed Care – PPO

## 2022-01-27 ENCOUNTER — Ambulatory Visit: Payer: BC Managed Care – PPO | Admitting: *Deleted

## 2022-01-27 ENCOUNTER — Encounter: Payer: Self-pay | Admitting: Women's Health

## 2022-01-27 ENCOUNTER — Other Ambulatory Visit: Payer: Self-pay

## 2022-01-27 ENCOUNTER — Ambulatory Visit (INDEPENDENT_AMBULATORY_CARE_PROVIDER_SITE_OTHER): Payer: BC Managed Care – PPO | Admitting: Women's Health

## 2022-01-27 VITALS — BP 122/73 | HR 94 | Wt 183.0 lb

## 2022-01-27 DIAGNOSIS — Z31430 Encounter of female for testing for genetic disease carrier status for procreative management: Secondary | ICD-10-CM | POA: Diagnosis not present

## 2022-01-27 DIAGNOSIS — Z3682 Encounter for antenatal screening for nuchal translucency: Secondary | ICD-10-CM

## 2022-01-27 DIAGNOSIS — Z3480 Encounter for supervision of other normal pregnancy, unspecified trimester: Secondary | ICD-10-CM

## 2022-01-27 DIAGNOSIS — O359XX0 Maternal care for (suspected) fetal abnormality and damage, unspecified, not applicable or unspecified: Secondary | ICD-10-CM | POA: Diagnosis not present

## 2022-01-27 DIAGNOSIS — F1120 Opioid dependence, uncomplicated: Secondary | ICD-10-CM

## 2022-01-27 DIAGNOSIS — O99321 Drug use complicating pregnancy, first trimester: Secondary | ICD-10-CM

## 2022-01-27 DIAGNOSIS — O0991 Supervision of high risk pregnancy, unspecified, first trimester: Secondary | ICD-10-CM

## 2022-01-27 DIAGNOSIS — Z3A12 12 weeks gestation of pregnancy: Secondary | ICD-10-CM

## 2022-01-27 DIAGNOSIS — Z3481 Encounter for supervision of other normal pregnancy, first trimester: Secondary | ICD-10-CM | POA: Diagnosis not present

## 2022-01-27 DIAGNOSIS — Z363 Encounter for antenatal screening for malformations: Secondary | ICD-10-CM

## 2022-01-27 DIAGNOSIS — Z8759 Personal history of other complications of pregnancy, childbirth and the puerperium: Secondary | ICD-10-CM

## 2022-01-27 DIAGNOSIS — O99320 Drug use complicating pregnancy, unspecified trimester: Secondary | ICD-10-CM

## 2022-01-27 LAB — POCT URINALYSIS DIPSTICK OB
Blood, UA: NEGATIVE
Glucose, UA: NEGATIVE
Ketones, UA: NEGATIVE
Leukocytes, UA: NEGATIVE
Nitrite, UA: NEGATIVE
POC,PROTEIN,UA: NEGATIVE

## 2022-01-27 NOTE — Progress Notes (Signed)
INITIAL OBSTETRICAL VISIT Patient name: Andrea [name] MRN [id number]  Date of birth: [birth year] Chief Complaint:   Initial Prenatal Visit  History of Present Illness:   Andrea [name] is a 26 y.o. G20P1001 Caucasian female at [redacted]w[redacted]d by [place] at 7 weeks with an Estimated Date of Delivery: 08/06/22 being seen today for her initial obstetrical visit.   Patient's last menstrual period was 10/23/2021 (approximate). Her obstetrical history is significant for  term VAVB, GHTN .   H/O opiate addiction, currently on suboxone  Today she reports no complaints.  Last pap 10/28/18. Results were: NILM w/ HRHPV not done, declines today  Depression screen [facility] 2/9 01/27/2022 10/28/2018  Decreased Interest 1 2  Down, Depressed, Hopeless 1 0  PHQ - 2 Score 2 2  Altered sleeping 1 3  Tired, decreased energy 1 3  Change in appetite 1 3  Feeling bad or failure about yourself  0 0  Trouble concentrating 1 3  Moving slowly or fidgety/restless 0 3  Suicidal thoughts 0 0  PHQ-9 Score 6 17  Difficult doing work/chores - Somewhat difficult     GAD 7 : Generalized Anxiety Score 01/27/2022  Nervous, Anxious, on Edge 1  Control/stop worrying 1  Worry too much - different things 1  Trouble relaxing 1  Restless 1  Easily annoyed or irritable 1  Afraid - awful might happen 1  Total GAD 7 Score 7     Review of Systems:   Pertinent items are noted in HPI Denies cramping/contractions, leakage of fluid, vaginal bleeding, abnormal vaginal discharge w/ itching/odor/irritation, headaches, visual changes, shortness of breath, chest pain, abdominal pain, severe nausea/vomiting, or problems with urination or bowel movements unless otherwise stated above.  Pertinent History Reviewed:  Reviewed past medical,surgical, social, obstetrical and family history.  Reviewed problem list, medications and allergies. OB History  Gravida Para Term Preterm AB Living  2 1 1  0 0 1  SAB IAB Ectopic Multiple Live Births  0 0  0 0 1    # Outcome Date GA Lbr Len/2nd Weight Sex Delivery Anes PTL Lv  2 Current           1 Term 04/25/19 [redacted]w[redacted]d 04:15 / 04:20 6 lb 3.5 oz (2.82 kg) F Vag-Vacuum EPI N LIV     Complications: Gestational hypertension   Physical Assessment:  There were no vitals filed for this visit.There is no height or weight on file to calculate BMI.       Physical Examination:  General appearance - well appearing, and in no distress  Mental status - alert, oriented to person, place, and time  Psych:  She has a normal mood and affect  Skin - warm and dry, normal color, no suspicious lesions noted  Chest - effort normal, all lung fields clear to auscultation bilaterally  Heart - normal rate and regular rhythm  Abdomen - soft, nontender  Extremities:  No swelling or varicosities noted  Thin prep pap is not done   Chaperone: N/A    TODAY'S NT abnormal, please see report. Images reviewed by Dr. Elonda Husky, and he discussed w/ pt  Results for orders placed or performed in visit on 01/27/22 (from the past 24 hour(s))  POC Urinalysis Dipstick OB   Collection Time: 01/27/22 12:16 PM  Result Value Ref Range   Color, UA     Clarity, UA     Glucose, UA Negative Negative   Bilirubin, UA     Ketones, UA neg  Spec Grav, UA     Blood, UA neg    pH, UA     POC,PROTEIN,UA Negative Negative, Trace, Small (1+), Moderate (2+), Large (3+), 4+   Urobilinogen, UA     Nitrite, UA neg    Leukocytes, UA Negative Negative   Appearance     Odor      Assessment & Plan:  1) High-Risk Pregnancy G2P1001 at [redacted]w[redacted]d with an Estimated Date of Delivery: 08/06/22   2) Initial OB visit  3) Fetal anomalies on today's u/s> Panorama drawn  4) Suboxone therapy  5) H/O GHTN  Meds: No orders of the defined types were placed in this encounter.   Initial labs obtained Continue prenatal vitamins Reviewed n/v relief measures and warning s/s to report Reviewed recommended weight gain based on pre-gravid BMI Encouraged  well-balanced diet Genetic & carrier screening discussed: requests Panorama, NT/IT, and Horizon  Ultrasound discussed; fetal survey: requested [employer] completed> form faxed if has or is planning to apply for medicaid The nature of [place] for [employer] with multiple MDs and other Advanced Practice Providers was explained to patient; also emphasized that fellows, residents, and students are part of our team. Does have home bp cuff. Office bp cuff given: no. Rx sent: n/a. Check bp weekly, let us know if consistently >140/90.   Follow-up: Return in about 2 weeks (around 02/10/2022) for f/u ob u/s and appt w/ Dr. Elonda Husky only.   Orders Placed This Encounter  Procedures   Urine Culture   GC/Chlamydia Probe Amp   Protein / creatinine ratio, urine   Genetic Screening   CBC/D/Plt+RPR+Rh+ABO+RubIgG...   Comprehensive metabolic panel   POC Urinalysis Dipstick OB    Roma Schanz CNM, [facility] 01/27/2022 12:25 PM

## 2022-01-27 NOTE — Patient Instructions (Signed)
Andrea [name], thank you for choosing our office today! We appreciate the opportunity to meet your healthcare needs. You may receive a short survey by mail, e-mail, or through MyChart. If you are happy with your care we would appreciate if you could take just a few minutes to complete the survey questions. We read all of your comments and take your feedback very seriously. Thank you again for choosing our office.  [facility] ([address] [place], [place] [address]) Entrance C, located off of [address] Free 24/7 valet parking   Nausea & Vomiting Have saltine crackers or pretzels by your bed and eat a few bites before you raise your head out of bed in the morning Eat small frequent meals throughout the day instead of large meals Drink plenty of fluids throughout the day to stay hydrated, just don't drink a lot of fluids with your meals.  This can make your stomach fill up faster making you feel sick Do not brush your teeth right after you eat Products with real ginger are good for nausea, like ginger ale and ginger hard candy Make sure it says made with real ginger! Sucking on sour candy like lemon heads is also good for nausea If your prenatal vitamins make you nauseated, take them at night so you will sleep through the nausea Sea Bands If you feel like you need medicine for the nausea & vomiting please let us know If you are unable to keep any fluids or food down please let us know   Constipation Drink plenty of fluid, preferably water, throughout the day Eat foods high in fiber such as fruits, vegetables, and grains Exercise, such as walking, is a good way to keep your bowels regular Drink warm fluids, especially warm prune juice, or decaf coffee Eat a 1/2 cup of real oatmeal (not instant), 1/2 cup applesauce, and 1/2-1 cup warm prune juice every day If needed, you may take Colace (docusate sodium) stool softener  once or twice a day to help keep the stool soft.  If you still are having problems with constipation, you may take Miralax once daily as needed to help keep your bowels regular.   Home Blood Pressure Monitoring for Patients   Your provider has recommended that you check your blood pressure (BP) at least once a week at home. If you do not have a blood pressure cuff at home, one will be provided for you. Contact your provider if you have not received your monitor within 1 week.   Helpful Tips for Accurate Home Blood Pressure Checks  Don't smoke, exercise, or drink caffeine 30 minutes before checking your BP Use the restroom before checking your BP (a full bladder can raise your pressure) Relax in a comfortable upright chair Feet on the ground Left arm resting comfortably on a flat surface at the level of your heart Legs uncrossed Back supported Sit quietly and don't talk Place the cuff on your bare arm Adjust snuggly, so that only two fingertips can fit between your skin and the top of the cuff Check 2 readings separated by at least one minute Keep a log of your BP readings For a visual, please reference this diagram: http://ccnc.care/bpdiagram  Provider Name: [facility]     Phone: [phone]  Zone 1: ALL CLEAR  Continue to monitor your symptoms:  BP reading is less than 140 (top number) or less than 90 (bottom   number)  No right upper stomach pain No headaches or seeing spots No feeling nauseated or throwing up No swelling in face and hands  Zone 2: CAUTION Call your doctor's office for any of the following:  BP reading is greater than 140 (top number) or greater than 90 (bottom number)  Stomach pain under your ribs in the middle or right side Headaches or seeing spots Feeling nauseated or throwing up Swelling in face and hands  Zone 3: EMERGENCY  Seek immediate medical care if you have any of the following:  BP reading is greater than160 (top number) or greater than  110 (bottom number) Severe headaches not improving with Tylenol Serious difficulty catching your breath Any worsening symptoms from Zone 2    First Trimester of Pregnancy The first trimester of pregnancy is from week 1 until the end of week 12 (months 1 through 3). A week after a sperm fertilizes an egg, the egg will implant on the wall of the uterus. This embryo will begin to develop into a baby. Genes from you and your partner are forming the baby. The female genes determine whether the baby is a boy or a girl. At 6-8 weeks, the eyes and face are formed, and the heartbeat can be seen on ultrasound. At the end of 12 weeks, all the baby's organs are formed.  Now that you are pregnant, you will want to do everything you can to have a healthy baby. Two of the most important things are to get good prenatal care and to follow your health care provider's instructions. Prenatal care is all the medical care you receive before the baby's birth. This care will help prevent, find, and treat any problems during the pregnancy and childbirth. BODY CHANGES Your body goes through many changes during pregnancy. The changes vary from woman to woman.  You may gain or lose a couple of pounds at first. You may feel sick to your stomach (nauseous) and throw up (vomit). If the vomiting is uncontrollable, call your health care provider. You may tire easily. You may develop headaches that can be relieved by medicines approved by your health care provider. You may urinate more often. Painful urination may mean you have a bladder infection. You may develop heartburn as a result of your pregnancy. You may develop constipation because certain hormones are causing the muscles that push waste through your intestines to slow down. You may develop hemorrhoids or swollen, bulging veins (varicose veins). Your breasts may begin to grow larger and become tender. Your nipples may stick out more, and the tissue that surrounds them  (areola) may become darker. Your gums may bleed and may be sensitive to brushing and flossing. Dark spots or blotches (chloasma, mask of pregnancy) may develop on your face. This will likely fade after the baby is born. Your menstrual periods will stop. You may have a loss of appetite. You may develop cravings for certain kinds of food. You may have changes in your emotions from day to day, such as being excited to be pregnant or being concerned that something may go wrong with the pregnancy and baby. You may have more vivid and strange dreams. You may have changes in your hair. These can include thickening of your hair, rapid growth, and changes in texture. Some women also have hair loss during or after pregnancy, or hair that feels dry or thin. Your hair will most likely return to normal after your baby is born. WHAT TO EXPECT AT YOUR PRENATAL   VISITS During a routine prenatal visit: You will be weighed to make sure you and the baby are growing normally. Your blood pressure will be taken. Your abdomen will be measured to track your baby's growth. The fetal heartbeat will be listened to starting around week 10 or 12 of your pregnancy. Test results from any previous visits will be discussed. Your health care provider may ask you: How you are feeling. If you are feeling the baby move. If you have had any abnormal symptoms, such as leaking fluid, bleeding, severe headaches, or abdominal cramping. If you have any questions. Other tests that may be performed during your first trimester include: Blood tests to find your blood type and to check for the presence of any previous infections. They will also be used to check for low iron levels (anemia) and Rh antibodies. Later in the pregnancy, blood tests for diabetes will be done along with other tests if problems develop. Urine tests to check for infections, diabetes, or protein in the urine. An ultrasound to confirm the proper growth and development  of the baby. An amniocentesis to check for possible genetic problems. Fetal screens for spina bifida and Down syndrome. You may need other tests to make sure you and the baby are doing well. HOME CARE INSTRUCTIONS  Medicines Follow your health care provider's instructions regarding medicine use. Specific medicines may be either safe or unsafe to take during pregnancy. Take your prenatal vitamins as directed. If you develop constipation, try taking a stool softener if your health care provider approves. Diet Eat regular, well-balanced meals. Choose a variety of foods, such as meat or vegetable-based protein, fish, milk and low-fat dairy products, vegetables, fruits, and whole grain breads and cereals. Your health care provider will help you determine the amount of weight gain that is right for you. Avoid raw meat and uncooked cheese. These carry germs that can cause birth defects in the baby. Eating four or five small meals rather than three large meals a day may help relieve nausea and vomiting. If you start to feel nauseous, eating a few soda crackers can be helpful. Drinking liquids between meals instead of during meals also seems to help nausea and vomiting. If you develop constipation, eat more high-fiber foods, such as fresh vegetables or fruit and whole grains. Drink enough fluids to keep your urine clear or pale yellow. Activity and Exercise Exercise only as directed by your health care provider. Exercising will help you: Control your weight. Stay in shape. Be prepared for labor and delivery. Experiencing pain or cramping in the lower abdomen or low back is a good sign that you should stop exercising. Check with your health care provider before continuing normal exercises. Try to avoid standing for long periods of time. Move your legs often if you must stand in one place for a long time. Avoid heavy lifting. Wear low-heeled shoes, and practice good posture. You may continue to have sex  unless your health care provider directs you otherwise. Relief of Pain or Discomfort Wear a good support bra for breast tenderness.   Take warm sitz baths to soothe any pain or discomfort caused by hemorrhoids. Use hemorrhoid cream if your health care provider approves.   Rest with your legs elevated if you have leg cramps or low back pain. If you develop varicose veins in your legs, wear support hose. Elevate your feet for 15 minutes, 3-4 times a day. Limit salt in your diet. Prenatal Care Schedule your prenatal visits by the   twelfth week of pregnancy. They are usually scheduled monthly at first, then more often in the last 2 months before delivery. Write down your questions. Take them to your prenatal visits. Keep all your prenatal visits as directed by your health care provider. Safety Wear your seat belt at all times when driving. Make a list of emergency phone numbers, including numbers for family, friends, the hospital, and police and fire departments. General Tips Ask your health care provider for a referral to a local prenatal education class. Begin classes no later than at the beginning of month 6 of your pregnancy. Ask for help if you have counseling or nutritional needs during pregnancy. Your health care provider can offer advice or refer you to specialists for help with various needs. Do not use hot tubs, steam rooms, or saunas. Do not douche or use tampons or scented sanitary pads. Do not cross your legs for long periods of time. Avoid cat litter boxes and soil used by cats. These carry germs that can cause birth defects in the baby and possibly loss of the fetus by miscarriage or stillbirth. Avoid all smoking, herbs, alcohol, and medicines not prescribed by your health care provider. Chemicals in these affect the formation and growth of the baby. Schedule a dentist appointment. At home, brush your teeth with a soft toothbrush and be gentle when you floss. SEEK MEDICAL CARE IF:   You have dizziness. You have mild pelvic cramps, pelvic pressure, or nagging pain in the abdominal area. You have persistent nausea, vomiting, or diarrhea. You have a bad smelling vaginal discharge. You have pain with urination. You notice increased swelling in your face, hands, legs, or ankles. SEEK IMMEDIATE MEDICAL CARE IF:  You have a fever. You are leaking fluid from your vagina. You have spotting or bleeding from your vagina. You have severe abdominal cramping or pain. You have rapid weight gain or loss. You vomit blood or material that looks like coffee grounds. You are exposed to German measles and have never had them. You are exposed to fifth disease or chickenpox. You develop a severe headache. You have shortness of breath. You have any kind of trauma, such as from a fall or a car accident. Document Released: 11/21/2001 Document Revised: 04/13/2014 Document Reviewed: 10/07/2013 ExitCare Patient Information 2015 [employer]. This information is not intended to replace advice given to you by your health care provider. Make sure you discuss any questions you have with your health care provider.  

## 2022-01-28 LAB — COMPREHENSIVE METABOLIC PANEL
ALT: 14 IU/L (ref 0–32)
AST: 14 IU/L (ref 0–40)
Albumin/Globulin Ratio: 1.6 (ref 1.2–2.2)
Albumin: 4.4 g/dL (ref 3.9–5.0)
Alkaline Phosphatase: 82 IU/L (ref 44–121)
BUN/Creatinine Ratio: 12 (ref 9–23)
BUN: 8 mg/dL (ref 6–20)
Bilirubin Total: 0.2 mg/dL (ref 0.0–1.2)
CO2: 19 mmol/L — ABNORMAL LOW (ref 20–29)
Calcium: 9.5 mg/dL (ref 8.7–10.2)
Chloride: 103 mmol/L (ref 96–106)
Creatinine, Ser: 0.67 mg/dL (ref 0.57–1.00)
Globulin, Total: 2.7 g/dL (ref 1.5–4.5)
Glucose: 100 mg/dL — ABNORMAL HIGH (ref 70–99)
Potassium: 4 mmol/L (ref 3.5–5.2)
Sodium: 137 mmol/L (ref 134–144)
Total Protein: 7.1 g/dL (ref 6.0–8.5)
eGFR: 124 mL/min/{1.73_m2} (ref >59)

## 2022-01-28 LAB — PROTEIN / CREATININE RATIO, URINE
Creatinine, Urine: 162.5 mg/dL
Protein, Ur: 14.9 mg/dL
Protein/Creat Ratio: 92 mg/g creat (ref 0–200)

## 2022-01-28 LAB — CBC/D/PLT+RPR+RH+ABO+RUBIGG...
Antibody Screen: NEGATIVE
Basophils Absolute: 0 10*3/uL (ref 0.0–0.2)
Basos: 0 %
EOS (ABSOLUTE): 0 10*3/uL (ref 0.0–0.4)
Eos: 0 %
HCV Ab: NONREACTIVE
HIV Screen 4th Generation wRfx: NONREACTIVE
Hematocrit: 41.2 % (ref 34.0–46.6)
Hemoglobin: 14.3 g/dL (ref 11.1–15.9)
Hepatitis B Surface Ag: NEGATIVE
Immature Grans (Abs): 0 10*3/uL (ref 0.0–0.1)
Immature Granulocytes: 0 %
Lymphocytes Absolute: 1.4 10*3/uL (ref 0.7–3.1)
Lymphs: 12 %
MCH: 30.1 pg (ref 26.6–33.0)
MCHC: 34.7 g/dL (ref 31.5–35.7)
MCV: 87 fL (ref 79–97)
Monocytes Absolute: 0.6 10*3/uL (ref 0.1–0.9)
Monocytes: 5 %
Neutrophils Absolute: 9 10*3/uL — ABNORMAL HIGH (ref 1.4–7.0)
Neutrophils: 83 %
Platelets: 288 10*3/uL (ref 150–450)
RBC: 4.75 x10E6/uL (ref 3.77–5.28)
RDW: 12.4 % (ref 11.7–15.4)
RPR Ser Ql: NONREACTIVE
Rh Factor: POSITIVE
Rubella Antibodies, IGG: 6.18 index (ref >0.99)
WBC: 11 10*3/uL — ABNORMAL HIGH (ref 3.4–10.8)

## 2022-01-28 LAB — HCV INTERPRETATION

## 2022-01-30 LAB — GC/CHLAMYDIA PROBE AMP
Chlamydia trachomatis, NAA: NEGATIVE
Neisseria Gonorrhoeae by PCR: NEGATIVE

## 2022-01-30 LAB — URINE CULTURE

## 2022-02-06 ENCOUNTER — Encounter: Payer: Self-pay | Admitting: Women's Health

## 2022-02-10 ENCOUNTER — Encounter: Payer: Self-pay | Admitting: Women's Health

## 2022-02-14 ENCOUNTER — Other Ambulatory Visit: Payer: BC Managed Care – PPO

## 2022-02-14 ENCOUNTER — Encounter: Payer: BC Managed Care – PPO | Admitting: Obstetrics & Gynecology

## 2022-02-22 ENCOUNTER — Other Ambulatory Visit: Payer: Self-pay | Admitting: Women's Health

## 2022-02-22 ENCOUNTER — Other Ambulatory Visit: Payer: Self-pay

## 2022-02-22 ENCOUNTER — Ambulatory Visit (INDEPENDENT_AMBULATORY_CARE_PROVIDER_SITE_OTHER): Payer: BC Managed Care – PPO

## 2022-02-22 ENCOUNTER — Ambulatory Visit (INDEPENDENT_AMBULATORY_CARE_PROVIDER_SITE_OTHER): Payer: BC Managed Care – PPO | Admitting: Obstetrics & Gynecology

## 2022-02-22 VITALS — BP 131/93 | HR 82

## 2022-02-22 DIAGNOSIS — O359XX0 Maternal care for (suspected) fetal abnormality and damage, unspecified, not applicable or unspecified: Secondary | ICD-10-CM

## 2022-02-22 DIAGNOSIS — O3680X0 Pregnancy with inconclusive fetal viability, not applicable or unspecified: Secondary | ICD-10-CM | POA: Diagnosis not present

## 2022-02-22 DIAGNOSIS — Z029 Encounter for administrative examinations, unspecified: Secondary | ICD-10-CM

## 2022-02-22 DIAGNOSIS — Z363 Encounter for antenatal screening for malformations: Secondary | ICD-10-CM | POA: Diagnosis not present

## 2022-02-22 DIAGNOSIS — O0991 Supervision of high risk pregnancy, unspecified, first trimester: Secondary | ICD-10-CM

## 2022-02-22 DIAGNOSIS — O039 Complete or unspecified spontaneous abortion without complication: Secondary | ICD-10-CM

## 2022-02-22 DIAGNOSIS — Z3A16 16 weeks gestation of pregnancy: Secondary | ICD-10-CM

## 2022-02-22 NOTE — Progress Notes (Signed)
? ?  HIGH-RISK PREGNANCY VISIT ?Patient name: Andrea [name] MRN [id number]  Date of birth: [birth year] ?Chief Complaint:   ?Follow-up (After ultrasound) ? ?History of Present Illness:   ?Andrea [name] is a 26 y.o. G2P1001 female at [redacted]w[redacted]d with an Estimated Date of Delivery: 08/06/22 being seen today for ongoing management of a high-risk pregnancy complicated by: ? ?-multiple anomalies noted on [place].  Today's [place]- absent FHT- fetal demise.  ? ?Pt denies abdominal pain or bleeding. ? ?Depression screen [facility] 2/9 01/27/2022 10/28/2018  ?Decreased Interest 1 2  ?Down, Depressed, Hopeless 1 0  ?PHQ - 2 Score 2 2  ?Altered sleeping 1 3  ?Tired, decreased energy 1 3  ?Change in appetite 1 3  ?Feeling bad or failure about yourself  0 0  ?Trouble concentrating 1 3  ?Moving slowly or fidgety/restless 0 3  ?Suicidal thoughts 0 0  ?PHQ-9 Score 6 17  ?Difficult doing work/chores - Somewhat difficult  ? ? ? ?Current Outpatient Medications  ?Medication Instructions  ? albuterol (VENTOLIN HFA) 108 (90 Base) MCG/ACT inhaler 2 puffs, Inhalation, Every 6 hours PRN  ? Prenatal Vit-Fe Fumarate-FA (PRENATAL VITAMIN PO) Oral  ? SUBOXONE 8-2 MG FILM 2 mg of opioid, Sublingual, Daily  ?  ? ?Review of Systems:   ?Pertinent items are noted in HPI ?Denies abnormal vaginal discharge w/ itching/odor/irritation, headaches, visual changes, shortness of breath, chest pain, abdominal pain, severe nausea/vomiting, or problems with urination or bowel movements unless otherwise stated above. ?Pertinent History Reviewed:  ?Reviewed past medical,surgical, social, obstetrical and family history.  ?Reviewed problem list, medications and allergies. ?Physical Assessment:  ? ?Vitals:  ? 02/22/22 1212  ?BP: (!) 131/93  ?Pulse: 82  ?There is no height or weight on file to calculate BMI. ?     ?     Physical Examination:  ? General appearance:  mild distress ? Pysch: tearful and upset ? Abd: soft and non-tender ? ? ?Fetal Surveillance Testing today: 9+6 wks single  IUP,no FHT visualized,GS 66.9 mm,posterior placenta,normal ovaries  ? ? ?No results found for this or any previous visit (from the past 24 hour(s)).  ? ?Assessment & Plan:  ?High-risk pregnancy: G2P1001 at [redacted]w[redacted]d with an Estimated Date of Delivery: 08/06/22  ? ?1) 2nd trimester loss ?-discussed US findings with patient ?-pt states she is not able to talk about next step right now ?-will call patient tomorrow to review management plan ? ? ?Meds: No orders of the defined types were placed in this encounter. ? ? ?Labs/procedures today: ultrasound ? ?Treatment Plan:  next step to be determined ? ? ? ?Janyth Pupa, DO ?Attending [place] ?[facility] [place] ? ? ? ?

## 2022-02-22 NOTE — Progress Notes (Signed)
[place] TA/TV: CRL 33.33 mm=9+6 wks single IUP,no FHT visualized,GS 66.9 mm,posterior placenta,normal ovaries,Dr.Ozan reviewed images during ultrasound and discussed results with patient  ?

## 2022-02-23 ENCOUNTER — Other Ambulatory Visit: Payer: Self-pay

## 2022-02-23 ENCOUNTER — Encounter (HOSPITAL_COMMUNITY): Payer: Self-pay | Admitting: Obstetrics and Gynecology

## 2022-02-23 NOTE — H&P (Signed)
Andrea [name] is an 26 y.o. female. G2P1001 here for D&E in s/o IUFD at [place] although measuring 10w by [place]. She was counseled on different options for delivery and she desired surgical management over induction.  ? ?Other US findings: large cystic hygroma, widespread fetal edema, dilated ventricles, clubbing  ?Panorama was low fetal fraction.  ? ?Pertinent Gynecological History: ?OB History: G2, P1001  ? ?Menstrual History: ?Patient's last menstrual period was 10/23/2021 (approximate). ?  ? ?Past Medical History:  ?Diagnosis Date  ? ADHD (attention deficit hyperactivity disorder)   ? Anxiety   ? Arthritis   ? Asthma   ? inhaler last used 2 weeks ago  ? History of kidney stones   ? Migraines   ? Ovarian cyst   ? Parent-child relational problem   ? Seizures ([place])   ? hx one seizure over a year ago  ? Subchorionic hemorrhage in first trimester 10/17/2018  ? Recheck at 19 weeks per JVF  ? Vaginal Pap smear, abnormal   ? ? ?Past Surgical History:  ?Procedure Laterality Date  ? CERVICAL ABLATION N/A 05/23/2017  ? Procedure: Laser Ablation of Cervix;  Surgeon: Florian Buff, MD;  Location: [facility];  Service: Gynecology;  Laterality: N/A;  ? COLPOSCOPY    ? EXTRACORPOREAL SHOCK WAVE LITHOTRIPSY    ? WISDOM TOOTH EXTRACTION    ? ? ?Family History  ?Problem Relation Age of Onset  ? Birth defects Maternal Grandmother   ? Cancer Maternal Grandmother   ? Diabetes Maternal Grandmother   ? Heart disease Maternal Grandmother   ? ? ?Social History:  reports that she has been smoking cigarettes. She has a 1.50 pack-year smoking history. She has never used smokeless tobacco. She reports that she does not currently use alcohol. She reports that she does not currently use drugs after having used the following drugs: Oxycodone, Marijuana, and Cocaine. ? ?Allergies:  ?Allergies  ?Allergen Reactions  ? Banana Shortness Of Breath, Swelling and Rash  ? Tylenol [Acetaminophen] Hives  ? ? ?No medications prior to admission.  ? ? ?Review of  Systems  ?Constitutional: Negative.   ?HENT: Negative.    ?Eyes: Negative.   ?Respiratory: Negative.    ?Cardiovascular: Negative.   ?Gastrointestinal: Negative.   ?Endocrine: Negative.   ?Genitourinary: Negative.   ?Musculoskeletal: Negative.   ?Skin: Negative.   ?Allergic/Immunologic: Negative.   ?Neurological: Negative.   ?Hematological: Negative.   ?Psychiatric/Behavioral: Negative.    ? ?Last menstrual period 10/23/2021. ?Physical Exam ?Constitutional:   ?   Appearance: Normal appearance. She is normal weight.  ?HENT:  ?   Head: Normocephalic and atraumatic.  ?   Nose: Nose normal.  ?   Mouth/Throat:  ?   Mouth: Mucous membranes are moist.  ?   Pharynx: Oropharynx is clear.  ?Eyes:  ?   Extraocular Movements: Extraocular movements intact.  ?   Conjunctiva/sclera: Conjunctivae normal.  ?   Pupils: Pupils are equal, round, and reactive to light.  ?Cardiovascular:  ?   Rate and Rhythm: Normal rate and regular rhythm.  ?   Pulses: Normal pulses.  ?   Heart sounds: Normal heart sounds.  ?Pulmonary:  ?   Effort: Pulmonary effort is normal.  ?   Breath sounds: Normal breath sounds.  ?Abdominal:  ?   General: Abdomen is flat. Bowel sounds are normal.  ?   Palpations: Abdomen is soft.  ?Genitourinary: ?   General: Normal vulva.  ?Musculoskeletal:     ?   General: Normal  range of motion.  ?   Cervical back: Normal range of motion and neck supple.  ?Skin: ?   General: Skin is warm and dry.  ?   Capillary Refill: Capillary refill takes less than 2 seconds.  ?Neurological:  ?   General: No focal deficit present.  ?   Mental Status: She is alert and oriented to person, place, and time. Mental status is at baseline.  ?Psychiatric:     ?   Mood and Affect: Mood normal.     ?   Behavior: Behavior normal.     ?   Thought Content: Thought content normal.     ?   Judgment: Judgment normal.  ? ? ?No results found for this or any previous visit (from the past 24 hour(s)). ? ?US OB Limited ? ?Result Date: 02/23/2022 ?Table formatting  from the original result was not included. Images from the original result were not included.  ..an Sales executive of Ultrasound Medicine Diplomatic Services operational officer) accredited practice Center for [facility] @ [place] [address] Ordering Provider: Roma Schanz, CNM FOLLOW UP SONOGRAM Andrea [name] is in the office for a follow up sonogram for viability. She is a 26 y.o. year old G2P1001 with Estimated Date of Delivery: 08/06/22 by early ultrasound now at  [redacted]w[redacted]d weeks gestation. Thus far the pregnancy has been complicated by abnormal genitic screening,HX of GHTN,HX of drug use. GESTATION: SINGLETON FETAL ACTIVITY:          Heart rate         No FHT          The fetus is inactive. AMNIOTIC FLUID: The amniotic fluid volume is  normal PLACENTA LOCALIZATION:  posterior GRADE 0 CERVIX: Appears closed ADNEXA: The ovaries are normal. GESTATIONAL AGE AND  BIOMETRICS: Gestational criteria: Estimated Date of Delivery: 08/06/22 by early ultrasound now at [redacted]w[redacted]d Previous Scans:2 GESTATIONAL SAC           66.9 mm  CROWN RUMP LENGTH           33.33 mm         9+6 weeks                                                                      AVERAGE EGA(BY THIS SCAN):  9+6 weeks                                         SUSPECTED ABNORMALITIES:  yes QUALITY OF SCAN: satisfactory TECHNICIAN COMMENTS: [place] TA/TV: CRL 33.33 mm=9+6 wks single IUP,no FHT visualized,GS 66.9 mm,posterior placenta,normal ovaries,Dr.Ozan reviewed images during ultrasound and discussed results with patient A copy of this report including all images has been saved and backed up to a second source for retrieval if needed. All measures and details of the anatomical scan, placentation, fluid volume and pelvic anatomy are contained in that report. [name] 02/22/2022 12:29 PM Clinical Impression and recommendations: I have reviewed the sonogram results above, combined with the patient's current clinical course, below are my impressions  and any appropriate recommendations for management based on the sonographic findings. No FHT  visualized.  Second trimester fetal loss/miscarriage Normal general sonographic findings Annalee Genta 02/23/2022 8:36 AM  ? ?US OB Transvaginal ? ?Result Date: 02/23/2022 ?Table formatting from the original result was not included. Images from the original result were not included.  ..an Sales executive of Ultrasound Medicine Diplomatic Services operational officer) accredited practice Center for [facility] @ [place] ,[place] [address] Ordering Provider: Roma Schanz, CNM FOLLOW UP SONOGRAM Palomas is in the office for a follow up sonogram for viability. She is a 26 y.o. year old G2P1001 with Estimated Date of Delivery: 08/06/22 by early ultrasound now at  [redacted]w[redacted]d weeks gestation. Thus far the pregnancy has been complicated by abnormal genitic screening,HX of GHTN,HX of drug use. GESTATION: SINGLETON FETAL ACTIVITY:          Heart rate         No FHT          The fetus is inactive. AMNIOTIC FLUID: The amniotic fluid volume is  normal PLACENTA LOCALIZATION:  posterior GRADE 0 CERVIX: Appears closed ADNEXA: The ovaries are normal. GESTATIONAL AGE AND  BIOMETRICS: Gestational criteria: Estimated Date of Delivery: 08/06/22 by early ultrasound now at [redacted]w[redacted]d Previous Scans:2 GESTATIONAL SAC           66.9 mm  CROWN RUMP LENGTH           33.33 mm         9+6 weeks                                                                      AVERAGE EGA(BY THIS SCAN):  9+6 weeks                                         SUSPECTED ABNORMALITIES:  yes QUALITY OF SCAN: satisfactory TECHNICIAN COMMENTS: [place] TA/TV: CRL 33.33 mm=9+6 wks single IUP,no FHT visualized,GS 66.9 mm,posterior placenta,normal ovaries,Dr.Ozan reviewed images during ultrasound and discussed results with patient A copy of this report including all images has been saved and backed up to a second source for retrieval if needed. All measures and details of the  anatomical scan, placentation, fluid volume and pelvic anatomy are contained in that report. [name] 02/22/2022 12:29 PM Clinical Impression and recommendations: I have reviewed the sonogram results a

## 2022-02-23 NOTE — Progress Notes (Addendum)
PCP is with [facility]. Ms. Oien denies chest pain or short of breath. Patient denies having any s/s of Covid in her household.  Patient denies any known exposure to Covid.  ? ?Ms Carone  is on Suboxone, patient reports that she has not take it today and wil not take in am. ?I also asked Ms Gruenhagen to not take Adivil , until after surgery. ? ?I instructed Ms Orrick to shower with antibacteria soap.  DO not shave. No nail polish, artificial or acrylic nails. Wear clean clothes, brush your teeth. ?Glasses, contact lens,dentures or partials may not be worn in the OR. If you need to wear them, please bring a case for glasses, do not wear contacts or bring a case, the hospital does not have contact cases, dentures or partials will have to be removed , make sure they are clean, we will provide a denture cup to put them in. You will need some one to drive you home and a responsible person over the age of 37 to stay with you for the first 24 hours after surgery.  ?

## 2022-02-24 ENCOUNTER — Other Ambulatory Visit: Payer: Self-pay

## 2022-02-24 ENCOUNTER — Encounter (HOSPITAL_COMMUNITY)
Admission: RE | Disposition: A | Discharge status: Home / Self Care | Payer: Self-pay | Source: Home / Self Care | Attending: Obstetrics and Gynecology

## 2022-02-24 ENCOUNTER — Ambulatory Visit (HOSPITAL_COMMUNITY): Payer: BC Managed Care – PPO | Admitting: General Practice

## 2022-02-24 ENCOUNTER — Ambulatory Visit (HOSPITAL_COMMUNITY): Payer: BC Managed Care – PPO

## 2022-02-24 ENCOUNTER — Ambulatory Visit (HOSPITAL_COMMUNITY)
Admission: RE | Admit: 2022-02-24 | Discharge: 2022-02-24 | Disposition: A | Discharge status: Home / Self Care | Payer: BC Managed Care – PPO | Attending: Obstetrics and Gynecology | Admitting: Obstetrics and Gynecology

## 2022-02-24 ENCOUNTER — Encounter (HOSPITAL_COMMUNITY): Payer: Self-pay | Admitting: Obstetrics and Gynecology

## 2022-02-24 DIAGNOSIS — Z6837 Body mass index (BMI) 37.0-37.9, adult: Secondary | ICD-10-CM | POA: Insufficient documentation

## 2022-02-24 DIAGNOSIS — F32A Depression, unspecified: Secondary | ICD-10-CM | POA: Diagnosis not present

## 2022-02-24 DIAGNOSIS — J45909 Unspecified asthma, uncomplicated: Secondary | ICD-10-CM | POA: Diagnosis not present

## 2022-02-24 DIAGNOSIS — O162 Unspecified maternal hypertension, second trimester: Secondary | ICD-10-CM | POA: Diagnosis not present

## 2022-02-24 DIAGNOSIS — Z419 Encounter for procedure for purposes other than remedying health state, unspecified: Secondary | ICD-10-CM

## 2022-02-24 DIAGNOSIS — F419 Anxiety disorder, unspecified: Secondary | ICD-10-CM | POA: Insufficient documentation

## 2022-02-24 DIAGNOSIS — I4510 Unspecified right bundle-branch block: Secondary | ICD-10-CM | POA: Insufficient documentation

## 2022-02-24 DIAGNOSIS — O021 Missed abortion: Secondary | ICD-10-CM | POA: Diagnosis not present

## 2022-02-24 DIAGNOSIS — Z87891 Personal history of nicotine dependence: Secondary | ICD-10-CM | POA: Diagnosis not present

## 2022-02-24 HISTORY — DX: Essential (primary) hypertension: I10

## 2022-02-24 HISTORY — DX: Other psychoactive substance abuse, uncomplicated: F19.10

## 2022-02-24 HISTORY — PX: DILATION AND EVACUATION: SHX1459

## 2022-02-24 LAB — TYPE AND SCREEN
ABO/RH(D): A POS
Antibody Screen: NEGATIVE

## 2022-02-24 SURGERY — DILATION AND EVACUATION, UTERUS
Anesthesia: General | Site: Vagina

## 2022-02-24 MED ORDER — LIDOCAINE 2% (20 MG/ML) 5 ML SYRINGE
INTRAMUSCULAR | Status: AC
  Filled 2022-02-24: qty 5

## 2022-02-24 MED ORDER — ACETAMINOPHEN 500 MG PO TABS: 1000.0000 mg | ORAL_TABLET | Freq: Once | ORAL | Status: DC

## 2022-02-24 MED ORDER — MEPERIDINE HCL 25 MG/ML IJ SOLN: 6.2500 mg | INTRAMUSCULAR | Status: DC | PRN

## 2022-02-24 MED ORDER — LACTATED RINGERS IV SOLN: INTRAVENOUS | Status: DC

## 2022-02-24 MED ORDER — LIDOCAINE 2% (20 MG/ML) 5 ML SYRINGE
INTRAMUSCULAR | Status: DC | PRN
  Administered 2022-02-24: 20 mg via INTRAVENOUS

## 2022-02-24 MED ORDER — PROPOFOL 10 MG/ML IV BOLUS
INTRAVENOUS | Status: AC
  Filled 2022-02-24: qty 20

## 2022-02-24 MED ORDER — ORAL CARE MOUTH RINSE: 15.0000 mL | Freq: Once | OROMUCOSAL | Status: AC

## 2022-02-24 MED ORDER — ONDANSETRON HCL 4 MG/2ML IJ SOLN
INTRAMUSCULAR | Status: AC
  Filled 2022-02-24: qty 2

## 2022-02-24 MED ORDER — SCOPOLAMINE 1 MG/3DAYS TD PT72
MEDICATED_PATCH | TRANSDERMAL | Status: AC
  Administered 2022-02-24: 1.5 mg via TRANSDERMAL
  Filled 2022-02-24: qty 1

## 2022-02-24 MED ORDER — ONDANSETRON HCL 4 MG/2ML IJ SOLN
INTRAMUSCULAR | Status: DC | PRN
  Administered 2022-02-24: 4 mg via INTRAVENOUS

## 2022-02-24 MED ORDER — POVIDONE-IODINE 10 % EX SWAB
2.0000 "application " | Freq: Once | CUTANEOUS | Status: AC
  Administered 2022-02-24: 2 via TOPICAL

## 2022-02-24 MED ORDER — LIDOCAINE-EPINEPHRINE 1 %-1:100000 IJ SOLN
INTRAMUSCULAR | Status: AC
  Filled 2022-02-24: qty 1

## 2022-02-24 MED ORDER — DOXYCYCLINE HYCLATE 100 MG IV SOLR
200.0000 mg | INTRAVENOUS | Status: AC
  Administered 2022-02-24: 200 mg via INTRAVENOUS
  Filled 2022-02-24: qty 200

## 2022-02-24 MED ORDER — IBUPROFEN 800 MG PO TABS: 800.0000 mg | ORAL_TABLET | Freq: Three times a day (TID) | ORAL | 1 refills | Status: DC | PRN

## 2022-02-24 MED ORDER — OXYCODONE HCL 5 MG/5ML PO SOLN: 5.0000 mg | Freq: Once | ORAL | Status: DC | PRN

## 2022-02-24 MED ORDER — TRANEXAMIC ACID-NACL 1000-0.7 MG/100ML-% IV SOLN
INTRAVENOUS | Status: AC
  Filled 2022-02-24: qty 100

## 2022-02-24 MED ORDER — SCOPOLAMINE 1 MG/3DAYS TD PT72: 1.0000 | MEDICATED_PATCH | TRANSDERMAL | Status: DC

## 2022-02-24 MED ORDER — MIDAZOLAM HCL 2 MG/2ML IJ SOLN
INTRAMUSCULAR | Status: DC | PRN
  Administered 2022-02-24: 2 mg via INTRAVENOUS

## 2022-02-24 MED ORDER — DEXAMETHASONE SODIUM PHOSPHATE 10 MG/ML IJ SOLN
INTRAMUSCULAR | Status: DC | PRN
  Administered 2022-02-24: 10 mg via INTRAVENOUS

## 2022-02-24 MED ORDER — 0.9 % SODIUM CHLORIDE (POUR BTL) OPTIME
TOPICAL | Status: DC | PRN
  Administered 2022-02-24: 1000 mL

## 2022-02-24 MED ORDER — FENTANYL CITRATE (PF) 100 MCG/2ML IJ SOLN: 25.0000 ug | INTRAMUSCULAR | Status: DC | PRN

## 2022-02-24 MED ORDER — CHLORHEXIDINE GLUCONATE 0.12 % MT SOLN
15.0000 mL | Freq: Once | OROMUCOSAL | Status: AC
  Administered 2022-02-24: 15 mL via OROMUCOSAL
  Filled 2022-02-24: qty 15

## 2022-02-24 MED ORDER — TRANEXAMIC ACID-NACL 1000-0.7 MG/100ML-% IV SOLN
INTRAVENOUS | Status: DC | PRN
  Administered 2022-02-24: 1000 mg via INTRAVENOUS

## 2022-02-24 MED ORDER — PROPOFOL 10 MG/ML IV BOLUS
INTRAVENOUS | Status: DC | PRN
  Administered 2022-02-24: 50 mg via INTRAVENOUS
  Administered 2022-02-24: 200 mg via INTRAVENOUS

## 2022-02-24 MED ORDER — MIDAZOLAM HCL 2 MG/2ML IJ SOLN
INTRAMUSCULAR | Status: AC
  Filled 2022-02-24: qty 2

## 2022-02-24 MED ORDER — FENTANYL CITRATE (PF) 250 MCG/5ML IJ SOLN
INTRAMUSCULAR | Status: AC
  Filled 2022-02-24: qty 5

## 2022-02-24 MED ORDER — MIDAZOLAM HCL 2 MG/2ML IJ SOLN: 0.5000 mg | Freq: Once | INTRAMUSCULAR | Status: DC | PRN

## 2022-02-24 MED ORDER — KETOROLAC TROMETHAMINE 15 MG/ML IJ SOLN
15.0000 mg | INTRAMUSCULAR | Status: AC
  Administered 2022-02-24: 15 mg via INTRAVENOUS
  Filled 2022-02-24: qty 1

## 2022-02-24 MED ORDER — OXYCODONE HCL 5 MG PO TABS: 5.0000 mg | ORAL_TABLET | Freq: Once | ORAL | Status: DC | PRN

## 2022-02-24 MED ORDER — DEXAMETHASONE SODIUM PHOSPHATE 10 MG/ML IJ SOLN
INTRAMUSCULAR | Status: AC
  Filled 2022-02-24: qty 1

## 2022-02-24 SURGICAL SUPPLY — 25 items
CATH FOLEY LF 3WAY 5CC16FR (CATHETERS) ×1 IMPLANT
CATH ROBINSON RED A/P 16FR (CATHETERS) ×3 IMPLANT
CURETTE VAC CURVD RIGID 14 1/2 (MISCELLANEOUS) ×1 IMPLANT
DECANTER SPIKE VIAL GLASS SM (MISCELLANEOUS) ×3 IMPLANT
FILTER UTR ASPR ASSEMBLY (MISCELLANEOUS) ×3 IMPLANT
GAUZE 4X4 16PLY ~~LOC~~+RFID DBL (SPONGE) ×3 IMPLANT
GLOVE SURG ENC MOIS LTX SZ6 (GLOVE) ×3 IMPLANT
GLOVE SURG UNDER POLY LF SZ6.5 (GLOVE) ×3 IMPLANT
GLOVE SURG UNDER POLY LF SZ7 (GLOVE) ×3 IMPLANT
GOWN STRL REUS W/ TWL LRG LVL3 (GOWN DISPOSABLE) ×4 IMPLANT
GOWN STRL REUS W/TWL LRG LVL3 (GOWN DISPOSABLE) ×4
HOSE CONNECTING 18IN BERKELEY (TUBING) ×3 IMPLANT
KIT BERKELEY 1ST TRI 3/8 NO TR (MISCELLANEOUS) ×3 IMPLANT
KIT BERKELEY 1ST TRIMESTER 3/8 (MISCELLANEOUS) ×3 IMPLANT
NS IRRIG 1000ML POUR BTL (IV SOLUTION) ×3 IMPLANT
PACK VAGINAL MINOR WOMEN LF (CUSTOM PROCEDURE TRAY) ×3 IMPLANT
PAD OB MATERNITY 4.3X12.25 (PERSONAL CARE ITEMS) ×3 IMPLANT
SET BERKELEY SUCTION TUBING (SUCTIONS) ×4 IMPLANT
TOWEL GREEN STERILE FF (TOWEL DISPOSABLE) ×3 IMPLANT
UNDERPAD 30X36 HEAVY ABSORB (UNDERPADS AND DIAPERS) ×3 IMPLANT
VACURETTE 10 RIGID CVD (CANNULA) IMPLANT
VACURETTE 12 RIGID CVD (CANNULA) ×1 IMPLANT
VACURETTE 7MM CVD STRL WRAP (CANNULA) IMPLANT
VACURETTE 8 RIGID CVD (CANNULA) IMPLANT
VACURETTE 9 RIGID CVD (CANNULA) IMPLANT

## 2022-02-24 NOTE — Anesthesia Procedure Notes (Signed)
Procedure Name: LMA Insertion ?Date/Time: 02/24/2022 2:26 PM ?Performed by: Maxine Glenn, CRNA ?Pre-anesthesia Checklist: Patient identified, Emergency Drugs available, Suction available and Patient being monitored ?Patient Re-evaluated:Patient Re-evaluated prior to induction ?Oxygen Delivery Method: Circle System Utilized ?Preoxygenation: Pre-oxygenation with 100% oxygen ?Induction Type: IV induction ?Ventilation: Mask ventilation without difficulty ?LMA: LMA inserted ?LMA Size: 4.0 ?Number of attempts: 1 ?Airway Equipment and Method: Bite block ?Placement Confirmation: positive ETCO2 ?Tube secured with: Tape ?Dental Injury: Teeth and Oropharynx as per pre-operative assessment  ? ? ? ? ?

## 2022-02-24 NOTE — Interval H&P Note (Signed)
History and Physical Interval Note: ? ?02/24/2022 ?2:11 PM ? ?Andrea [name]  has presented today for surgery, with the diagnosis of MISSED ABORTION.  The various methods of treatment have been discussed with the patient and family. After consideration of risks, benefits and other options for treatment, the patient has consented to  Procedure(s): ?DILATATION AND EVACUATION (N/A) as a surgical intervention.  The patient's history has been reviewed, patient examined, no change in status, stable for surgery.  I have reviewed the patient's chart and labs.  Questions were answered to the patient's satisfaction.   ? ? ?Radene Gunning ? ? ?

## 2022-02-24 NOTE — Anesthesia Preprocedure Evaluation (Addendum)
Anesthesia Evaluation  ?Patient identified by MRN, date of birth, ID band ?Patient awake ? ? ? ?Reviewed: ?Allergy & Precautions, NPO status , Patient's Chart, lab work & pertinent test results ? ?History of Anesthesia Complications ?Negative for: history of anesthetic complications ? ?Airway ?Mallampati: II ? ?TM Distance: >3 FB ?Neck ROM: Full ? ? ? Dental ? ?(+) Poor Dentition, Chipped, Dental Advisory Given ?  ?Pulmonary ?asthma , Patient abstained from smoking., former smoker,  ?  ?breath sounds clear to auscultation ? ? ? ? ? ? Cardiovascular ?(-) hypertensionnegative cardio ROS ? ? ?Rhythm:Regular Rate:Normal ? ? ?  ?Neuro/Psych ? Headaches, PSYCHIATRIC DISORDERS (ADHD) Anxiety Depression   ? GI/Hepatic ?negative GI ROS, (+)  ?  ? substance abuse (Suboxone) ? ,   ?Endo/Other  ?Morbid obesity ? Renal/GU ?negative Renal ROS  ? ?  ?Musculoskeletal ? ? Abdominal ?(+) + obese,   ?Peds ? Hematology ?negative hematology ROS ?(+)   ?Anesthesia Other Findings ? ? Reproductive/Obstetrics ?(+) Pregnancy (missed Ab) ? ?  ? ? ? ? ? ? ? ? ? ? ? ? ? ?  ?  ? ? ? ? ? ? ? ?Anesthesia Physical ?Anesthesia Plan ? ?ASA: 3 ? ?Anesthesia Plan: General  ? ?Post-op Pain Management: Tylenol PO (pre-op)*  ? ?Induction:  ? ?PONV Risk Score and Plan: 3 and Ondansetron, Dexamethasone and Scopolamine patch - Pre-op ? ?Airway Management Planned: LMA ? ?Additional Equipment: None ? ?Intra-op Plan:  ? ?Post-operative Plan:  ? ?Informed Consent: I have reviewed the patients History and Physical, chart, labs and discussed the procedure including the risks, benefits and alternatives for the proposed anesthesia with the patient or authorized representative who has indicated his/her understanding and acceptance.  ? ? ? ?Dental advisory given ? ?Plan Discussed with: CRNA and Surgeon ? ?Anesthesia Plan Comments:   ? ? ? ? ? ?Anesthesia Quick Evaluation ? ?

## 2022-02-24 NOTE — Anesthesia Postprocedure Evaluation (Signed)
Anesthesia Post Note ? ?Patient: Andrea [name] ? ?Procedure(s) Performed: ULTRASOUND GUIDED DILATATION AND CURETTAGE (Vagina ) ? ?  ? ?Patient location during evaluation: PACU ?Anesthesia Type: General ?Level of consciousness: awake and alert, oriented and patient cooperative ?Pain management: pain level controlled ?Vital Signs Assessment: post-procedure vital signs reviewed and stable ?Respiratory status: spontaneous breathing, nonlabored ventilation and respiratory function stable ?Cardiovascular status: blood pressure returned to baseline and stable ?Postop Assessment: no apparent nausea or vomiting, adequate PO intake and able to ambulate ?Anesthetic complications: no ? ? ?No notable events documented. ? ?Last Vitals:  ?Vitals:  ? 02/24/22 1515 02/24/22 1530  ?BP: 112/73 114/67  ?Pulse: 60 62  ?Resp: 13 11  ?Temp:  37.1 ?C  ?SpO2: 97% 99%  ?  ?Last Pain:  ?Vitals:  ? 02/24/22 1530  ?PainSc: 0-No pain  ? ? ?  ?  ?  ?  ?  ?  ? ?Kristoph Sattler,E. Araseli Sherry ? ? ? ? ?

## 2022-02-24 NOTE — Op Note (Signed)
02/24/22 ? ?Preoperative Diagnosis: Missed abortion - 2nd trimester ?Postoperative Diagnosis: Same ?Procedure: Dilation and evacuation under US guidance ?Surgeon: Dr. Milas Hock ?Assist: Dr. Karyl Kinnier ?Anesthesia: LMA ?EBL: 50 cc ?IVF: 500 cc  ?UOP: None, not drained ?Complications: None ? ?Findings: NEFG, normal appearing cervix with ectropion. Normal anteverted uterus, products of conception noted at the start of the procedure. At the end of the procedure, no POC seen in the uterus ? ?Description of the procedure: ?Preop antibiotics of doxycycline given. Informed consent reviewed and signed. Pt given opportunity to ask questions. We reviewed the consent for genetics and she signed it following our conversation.  ? ?Pt prepped and draped in the dorsal lithotomy fashion after LMA anesthesia found to be adequate. Timeout performed.  ? ?Open-sided speculum placed into the vagina. Cervix grasped with a single tooth tenaculum. Cervix progressively dilated to a 39 pratt.  I used a 12 mm rigid curette. Several passes done with the suction curette and the tissue was retrieved. Moderate bleeding noted and TXA given.  A gentle pass was done with a 1 curette. A gritty texture noted in all areas of the uterus confirming complete evacuation. Bleeding was then noted to be minimal. Procedure completed. All instruments removed. Counts correct x2.  ? ?Products of conception sent to pathology.  She desires  genetics. Rh Positive - rhogam not indicated ? ?Pt taken to recovery room in stable condition. ? ?A message will be sent to the office to ensure follow up.  ?

## 2022-02-24 NOTE — Transfer of Care (Signed)
Immediate Anesthesia Transfer of Care Note ? ?Patient: Andrea [name] ? ?Procedure(s) Performed: ULTRASOUND GUIDED DILATATION AND CURETTAGE (Vagina ) ? ?Patient Location: PACU ? ?Anesthesia Type:General ? ?Level of Consciousness: awake and drowsy ? ?Airway & Oxygen Therapy: Patient Spontanous Breathing ? ?Post-op Assessment: Report given to RN and Post -op Vital signs reviewed and stable ? ?Post vital signs: Reviewed and stable ? ?Last Vitals:  ?Vitals Value Taken Time  ?BP 105/48 02/24/22 1501  ?Temp    ?Pulse 90 02/24/22 1501  ?Resp 16 02/24/22 1502  ?SpO2 97 % 02/24/22 1501  ?Vitals shown include unvalidated device data. ? ?Last Pain:  ?Vitals:  ? 02/24/22 1210  ?PainSc: 0-No pain  ?   ? ?  ? ?Complications: No notable events documented. ?

## 2022-02-27 LAB — SURGICAL PATHOLOGY

## 2022-02-28 ENCOUNTER — Telehealth: Payer: Self-pay | Admitting: Obstetrics & Gynecology

## 2022-02-28 ENCOUNTER — Encounter (HOSPITAL_COMMUNITY): Payer: Self-pay | Admitting: Obstetrics and Gynecology

## 2022-02-28 ENCOUNTER — Encounter: Payer: Self-pay | Admitting: Obstetrics & Gynecology

## 2022-02-28 NOTE — Telephone Encounter (Signed)
Patient states bleeding has been ok up until this morning but did pass "something that was disgusting" in the toilet. Felt like she "had to push" and it came out. Has passed a few clots the size of aquarter. She is not changing her pad more than 2 times and hour and bleeding still seems to be fine since passing it. Not experiencing any dizziness or lightheadedness. Advised to continue to monitor the bleeding and if it increased or she continued to pass clots, to let us know.  Pt verbalized understanding with no further questions.  ?

## 2022-02-28 NOTE — Telephone Encounter (Signed)
PATIENT CALLING STATING THAT SHE HAD A DNC AND SHE IS HAVE LARGE BLOOD CLOTS COMING OUT AND SHE IS WANTING TO KNOW IF SOMEONE WILL CALL HER AND LET HER KNOW IF THIS IS NORMAL OR IF SHE NEEDS TO GO BACK TO THE ER.  ?

## 2022-03-01 ENCOUNTER — Encounter: Payer: Self-pay | Admitting: *Deleted

## 2022-03-01 DIAGNOSIS — Z029 Encounter for administrative examinations, unspecified: Secondary | ICD-10-CM

## 2022-03-02 ENCOUNTER — Telehealth: Payer: Self-pay

## 2022-03-02 NOTE — Telephone Encounter (Signed)
Dr Charlotta Newton stated that a letter could be sent to pt for her boyfriend to be out of work. Called pt to inquire about the dates he was out. Pt stated that he was out from 3/15-3/22 and went back to work today. His employer is wanting a letter that specifically states that the pt had a miscarriage and a D&C. Pt was asked how she wanted the letter. She asked if he could pick it up tomorrow on his way to work. Pt informed a letter would be waiting for him and a copy in her Mychart file. Pt confirmed understanding. ?

## 2022-03-02 NOTE — Telephone Encounter (Signed)
Andrea [name] called to ensure that an Anora miscarriage test had been performed on this pt. Confirmed with Dr Charlotta Newton that it should have been done at hospital. Pt's insurance information was updated with [employer]. ?

## 2022-03-06 ENCOUNTER — Encounter: Payer: Self-pay | Admitting: *Deleted

## 2022-03-10 DIAGNOSIS — F4320 Adjustment disorder, unspecified: Secondary | ICD-10-CM | POA: Diagnosis not present

## 2022-03-10 DIAGNOSIS — Z6836 Body mass index (BMI) 36.0-36.9, adult: Secondary | ICD-10-CM | POA: Diagnosis not present

## 2022-03-10 DIAGNOSIS — H9202 Otalgia, left ear: Secondary | ICD-10-CM | POA: Diagnosis not present

## 2022-03-10 DIAGNOSIS — F39 Unspecified mood [affective] disorder: Secondary | ICD-10-CM | POA: Diagnosis not present

## 2022-03-27 ENCOUNTER — Encounter: Payer: BC Managed Care – PPO | Admitting: Obstetrics & Gynecology

## 2022-05-02 DIAGNOSIS — F1120 Opioid dependence, uncomplicated: Secondary | ICD-10-CM | POA: Diagnosis not present

## 2022-07-26 ENCOUNTER — Ambulatory Visit (INDEPENDENT_AMBULATORY_CARE_PROVIDER_SITE_OTHER): Payer: BC Managed Care – PPO | Admitting: *Deleted

## 2022-07-26 ENCOUNTER — Encounter: Payer: Self-pay | Admitting: *Deleted

## 2022-07-26 VITALS — BP 124/85 | HR 99 | Ht 59.0 in | Wt 185.0 lb

## 2022-07-26 DIAGNOSIS — Z3201 Encounter for pregnancy test, result positive: Secondary | ICD-10-CM

## 2022-07-26 LAB — POCT URINE PREGNANCY: Preg Test, Ur: POSITIVE — AB

## 2022-07-26 NOTE — Progress Notes (Signed)
   NURSE VISIT- PREGNANCY CONFIRMATION   SUBJECTIVE:  Andrea [name] is a 26 y.o. G67P1101 female at [redacted]w[redacted]d by uncertain LMP of Patient's last menstrual period was 06/24/2022 (approximate). Here for pregnancy confirmation.  Home pregnancy test: positive x 2.   She reports nausea.  She is not taking prenatal vitamins.  Pt was advised can try Unisom or Benadryl to help with sleep.   OBJECTIVE:  BP 124/85 (BP Location: Left Arm, Patient Position: Sitting, Cuff Size: Normal)   Pulse 99   Ht 4\' 11"  (1.499 m)   Wt 185 lb (83.9 kg)   LMP 06/24/2022 (Approximate)   Breastfeeding No   BMI 37.37 kg/m   Appears well, in no apparent distress  Results for orders placed or performed in visit on 07/26/22 (from the past 24 hour(s))  POCT urine pregnancy   Collection Time: 07/26/22  2:29 PM  Result Value Ref Range   Preg Test, Ur Positive (A) Negative    ASSESSMENT: Positive pregnancy test, [redacted]w[redacted]d by LMP    PLAN: Schedule for dating ultrasound in 5 weeks Prenatal vitamins: plans to begin OTC ASAP   Nausea medicines: not currently needed   OB packet given: Yes  [redacted]w[redacted]d  07/26/2022 2:46 PM

## 2022-08-29 ENCOUNTER — Other Ambulatory Visit: Payer: Self-pay | Admitting: Obstetrics & Gynecology

## 2022-08-29 DIAGNOSIS — O3680X0 Pregnancy with inconclusive fetal viability, not applicable or unspecified: Secondary | ICD-10-CM

## 2022-08-30 ENCOUNTER — Ambulatory Visit (INDEPENDENT_AMBULATORY_CARE_PROVIDER_SITE_OTHER): Payer: BC Managed Care – PPO

## 2022-08-30 DIAGNOSIS — Z3A09 9 weeks gestation of pregnancy: Secondary | ICD-10-CM

## 2022-08-30 DIAGNOSIS — O3680X0 Pregnancy with inconclusive fetal viability, not applicable or unspecified: Secondary | ICD-10-CM | POA: Diagnosis not present

## 2022-08-30 NOTE — Progress Notes (Signed)
[place] 11+1 wks,single IUP,CRL 42.29 mm,normal ovaries,FHR 174 bpm

## 2022-09-12 ENCOUNTER — Other Ambulatory Visit: Payer: Self-pay | Admitting: Obstetrics & Gynecology

## 2022-09-12 DIAGNOSIS — Z3682 Encounter for antenatal screening for nuchal translucency: Secondary | ICD-10-CM

## 2022-09-13 ENCOUNTER — Ambulatory Visit (INDEPENDENT_AMBULATORY_CARE_PROVIDER_SITE_OTHER): Payer: BC Managed Care – PPO

## 2022-09-13 DIAGNOSIS — Z3682 Encounter for antenatal screening for nuchal translucency: Secondary | ICD-10-CM

## 2022-09-13 DIAGNOSIS — Z3A13 13 weeks gestation of pregnancy: Secondary | ICD-10-CM | POA: Diagnosis not present

## 2022-09-13 NOTE — Progress Notes (Signed)
[place] 13+1 wks,measurements c/w dates,CRL 72.61 mm,FHR 170 bpm,normal ovaries,NB present,NT 2.5 mm,posterior placenta

## 2022-09-15 DIAGNOSIS — Z3490 Encounter for supervision of normal pregnancy, unspecified, unspecified trimester: Secondary | ICD-10-CM | POA: Insufficient documentation

## 2022-09-18 ENCOUNTER — Ambulatory Visit (INDEPENDENT_AMBULATORY_CARE_PROVIDER_SITE_OTHER): Payer: BC Managed Care – PPO | Admitting: Advanced Practice Midwife

## 2022-09-18 ENCOUNTER — Encounter: Payer: Self-pay | Admitting: Advanced Practice Midwife

## 2022-09-18 ENCOUNTER — Ambulatory Visit: Payer: BC Managed Care – PPO | Admitting: *Deleted

## 2022-09-18 VITALS — Wt 177.0 lb

## 2022-09-18 DIAGNOSIS — F1121 Opioid dependence, in remission: Secondary | ICD-10-CM

## 2022-09-18 DIAGNOSIS — Z3A13 13 weeks gestation of pregnancy: Secondary | ICD-10-CM | POA: Diagnosis not present

## 2022-09-18 DIAGNOSIS — Z8759 Personal history of other complications of pregnancy, childbirth and the puerperium: Secondary | ICD-10-CM | POA: Diagnosis not present

## 2022-09-18 DIAGNOSIS — Z3480 Encounter for supervision of other normal pregnancy, unspecified trimester: Secondary | ICD-10-CM | POA: Diagnosis not present

## 2022-09-18 LAB — OB RESULTS CONSOLE RPR: RPR: NONREACTIVE

## 2022-09-18 LAB — POCT URINALYSIS DIPSTICK OB
Glucose, UA: NEGATIVE
Ketones, UA: NEGATIVE
Leukocytes, UA: NEGATIVE
Nitrite, UA: NEGATIVE
POC,PROTEIN,UA: NEGATIVE

## 2022-09-18 LAB — OB RESULTS CONSOLE GC/CHLAMYDIA
Chlamydia: NEGATIVE
Neisseria Gonorrhea: NEGATIVE

## 2022-09-18 LAB — OB RESULTS CONSOLE RUBELLA ANTIBODY, IGM: Rubella: IMMUNE

## 2022-09-18 LAB — HEPATITIS C ANTIBODY: HCV Ab: NEGATIVE

## 2022-09-18 LAB — OB RESULTS CONSOLE HEPATITIS B SURFACE ANTIGEN: Hepatitis B Surface Ag: NEGATIVE

## 2022-09-18 LAB — OB RESULTS CONSOLE HIV ANTIBODY (ROUTINE TESTING): HIV: NONREACTIVE

## 2022-09-18 MED ORDER — ASPIRIN 81 MG PO TBEC: 162.0000 mg | DELAYED_RELEASE_TABLET | Freq: Every day | ORAL | 6 refills | Status: DC

## 2022-09-18 NOTE — Patient Instructions (Signed)
Andrea [name], I greatly value your feedback.  If you receive a survey following your visit with [place] today, we appreciate you taking the time to fill it out.  Thanks, Nigel Berthold, DNP, [place]!!! It is now [facility] ([place], [place] [address]) Entrance located off of [address] parking   Nausea & Vomiting Have saltine crackers or pretzels by your bed and eat a few bites before you raise your head out of bed in the morning Eat small frequent meals throughout the day instead of large meals Drink plenty of fluids throughout the day to stay hydrated, just don't drink a lot of fluids with your meals.  This can make your stomach fill up faster making you feel sick Do not brush your teeth right after you eat Products with real ginger are good for nausea, like ginger ale and ginger hard candy Make sure it says made with real ginger! Sucking on sour candy like lemon heads is also good for nausea If your prenatal vitamins make you nauseated, take them at night so you will sleep through the nausea Sea Bands If you feel like you need medicine for the nausea & vomiting please let us know If you are unable to keep any fluids or food down please let us know   Constipation Drink plenty of fluid, preferably water, throughout the day Eat foods high in fiber such as fruits, vegetables, and grains Exercise, such as walking, is a good way to keep your bowels regular Drink warm fluids, especially warm prune juice, or decaf coffee Eat a 1/2 cup of real oatmeal (not instant), 1/2 cup applesauce, and 1/2-1 cup warm prune juice every day If needed, you may take Colace (docusate sodium) stool softener once or twice a day to help keep the stool soft.  If you still are having problems with constipation, you may take Miralax once daily as needed to help keep your bowels regular.   Home Blood Pressure Monitoring for  Patients   Your provider has recommended that you check your blood pressure (BP) at least once a week at home. If you do not have a blood pressure cuff at home, one will be provided for you. Contact your provider if you have not received your monitor within 1 week.   Helpful Tips for Accurate Home Blood Pressure Checks  Don't smoke, exercise, or drink caffeine 30 minutes before checking your BP Use the restroom before checking your BP (a full bladder can raise your pressure) Relax in a comfortable upright chair Feet on the ground Left arm resting comfortably on a flat surface at the level of your heart Legs uncrossed Back supported Sit quietly and don't talk Place the cuff on your bare arm Adjust snuggly, so that only two fingertips can fit between your skin and the top of the cuff Check 2 readings separated by at least one minute Keep a log of your BP readings For a visual, please reference this diagram: http://ccnc.care/bpdiagram  Provider Name: [facility]     Phone: [phone]  Zone 1: ALL CLEAR  Continue to monitor your symptoms:  BP reading is less than 140 (top number) or less than 90 (bottom number)  No right upper stomach pain No headaches or seeing spots No feeling nauseated or throwing up No swelling in face and hands  Zone 2: CAUTION Call your doctor's office for any of the following:  BP reading is greater than 140 (top number) or greater than 90 (bottom number)  Stomach pain under your ribs in the middle or right side Headaches or seeing spots Feeling nauseated or throwing up Swelling in face and hands  Zone 3: EMERGENCY  Seek immediate medical care if you have any of the following:  BP reading is greater than160 (top number) or greater than 110 (bottom number) Severe headaches not improving with Tylenol Serious difficulty catching your breath Any worsening symptoms from Zone 2    First Trimester of Pregnancy The first trimester of pregnancy is from  week 1 until the end of week 12 (months 1 through 3). A week after a sperm fertilizes an egg, the egg will implant on the wall of the uterus. This embryo will begin to develop into a baby. Genes from you and your partner are forming the baby. The female genes determine whether the baby is a boy or a girl. At 6-8 weeks, the eyes and face are formed, and the heartbeat can be seen on ultrasound. At the end of 12 weeks, all the baby's organs are formed.  Now that you are pregnant, you will want to do everything you can to have a healthy baby. Two of the most important things are to get good prenatal care and to follow your health care provider's instructions. Prenatal care is all the medical care you receive before the baby's birth. This care will help prevent, find, and treat any problems during the pregnancy and childbirth. BODY CHANGES Your body goes through many changes during pregnancy. The changes vary from woman to woman.  You may gain or lose a couple of pounds at first. You may feel sick to your stomach (nauseous) and throw up (vomit). If the vomiting is uncontrollable, call your health care provider. You may tire easily. You may develop headaches that can be relieved by medicines approved by your health care provider. You may urinate more often. Painful urination may mean you have a bladder infection. You may develop heartburn as a result of your pregnancy. You may develop constipation because certain hormones are causing the muscles that push waste through your intestines to slow down. You may develop hemorrhoids or swollen, bulging veins (varicose veins). Your breasts may begin to grow larger and become tender. Your nipples may stick out more, and the tissue that surrounds them (areola) may become darker. Your gums may bleed and may be sensitive to brushing and flossing. Dark spots or blotches (chloasma, mask of pregnancy) may develop on your face. This will likely fade after the baby is  born. Your menstrual periods will stop. You may have a loss of appetite. You may develop cravings for certain kinds of food. You may have changes in your emotions from day to day, such as being excited to be pregnant or being concerned that something may go wrong with the pregnancy and baby. You may have more vivid and strange dreams. You may have changes in your hair. These can include thickening of your hair, rapid growth, and changes in texture. Some women also have hair loss during or after pregnancy, or hair that feels dry or thin. Your hair will most likely return to normal after your baby is born. WHAT TO EXPECT AT YOUR PRENATAL VISITS During a routine prenatal visit: You will be weighed to make sure you and the baby are growing normally. Your blood pressure will be taken. Your abdomen will be measured to track your baby's growth. The fetal  heartbeat will be listened to starting around week 10 or 12 of your pregnancy. Test results from any previous visits will be discussed. Your health care provider may ask you: How you are feeling. If you are feeling the baby move. If you have had any abnormal symptoms, such as leaking fluid, bleeding, severe headaches, or abdominal cramping. If you have any questions. Other tests that may be performed during your first trimester include: Blood tests to find your blood type and to check for the presence of any previous infections. They will also be used to check for low iron levels (anemia) and Rh antibodies. Later in the pregnancy, blood tests for diabetes will be done along with other tests if problems develop. Urine tests to check for infections, diabetes, or protein in the urine. An ultrasound to confirm the proper growth and development of the baby. An amniocentesis to check for possible genetic problems. Fetal screens for spina bifida and Down syndrome. You may need other tests to make sure you and the baby are doing well. HOME CARE  INSTRUCTIONS  Medicines Follow your health care provider's instructions regarding medicine use. Specific medicines may be either safe or unsafe to take during pregnancy. Take your prenatal vitamins as directed. If you develop constipation, try taking a stool softener if your health care provider approves. Diet Eat regular, well-balanced meals. Choose a variety of foods, such as meat or vegetable-based protein, fish, milk and low-fat dairy products, vegetables, fruits, and whole grain breads and cereals. Your health care provider will help you determine the amount of weight gain that is right for you. Avoid raw meat and uncooked cheese. These carry germs that can cause birth defects in the baby. Eating four or five small meals rather than three large meals a day may help relieve nausea and vomiting. If you start to feel nauseous, eating a few soda crackers can be helpful. Drinking liquids between meals instead of during meals also seems to help nausea and vomiting. If you develop constipation, eat more high-fiber foods, such as fresh vegetables or fruit and whole grains. Drink enough fluids to keep your urine clear or pale yellow. Activity and Exercise Exercise only as directed by your health care provider. Exercising will help you: Control your weight. Stay in shape. Be prepared for labor and delivery. Experiencing pain or cramping in the lower abdomen or low back is a good sign that you should stop exercising. Check with your health care provider before continuing normal exercises. Try to avoid standing for long periods of time. Move your legs often if you must stand in one place for a long time. Avoid heavy lifting. Wear low-heeled shoes, and practice good posture. You may continue to have sex unless your health care provider directs you otherwise. Relief of Pain or Discomfort Wear a good support bra for breast tenderness.   Take warm sitz baths to soothe any pain or discomfort caused by  hemorrhoids. Use hemorrhoid cream if your health care provider approves.   Rest with your legs elevated if you have leg cramps or low back pain. If you develop varicose veins in your legs, wear support hose. Elevate your feet for 15 minutes, 3-4 times a day. Limit salt in your diet. Prenatal Care Schedule your prenatal visits by the twelfth week of pregnancy. They are usually scheduled monthly at first, then more often in the last 2 months before delivery. Write down your questions. Take them to your prenatal visits. Keep all your prenatal visits as directed  by your health care provider. Safety Wear your seat belt at all times when driving. Make a list of emergency phone numbers, including numbers for family, friends, the hospital, and police and fire departments. General Tips Ask your health care provider for a referral to a local prenatal education class. Begin classes no later than at the beginning of month 6 of your pregnancy. Ask for help if you have counseling or nutritional needs during pregnancy. Your health care provider can offer advice or refer you to specialists for help with various needs. Do not use hot tubs, steam rooms, or saunas. Do not douche or use tampons or scented sanitary pads. Do not cross your legs for long periods of time. Avoid cat litter boxes and soil used by cats. These carry germs that can cause birth defects in the baby and possibly loss of the fetus by miscarriage or stillbirth. Avoid all smoking, herbs, alcohol, and medicines not prescribed by your health care provider. Chemicals in these affect the formation and growth of the baby. Schedule a dentist appointment. At home, brush your teeth with a soft toothbrush and be gentle when you floss. SEEK MEDICAL CARE IF:  You have dizziness. You have mild pelvic cramps, pelvic pressure, or nagging pain in the abdominal area. You have persistent nausea, vomiting, or diarrhea. You have a bad smelling vaginal  discharge. You have pain with urination. You notice increased swelling in your face, hands, legs, or ankles. SEEK IMMEDIATE MEDICAL CARE IF:  You have a fever. You are leaking fluid from your vagina. You have spotting or bleeding from your vagina. You have severe abdominal cramping or pain. You have rapid weight gain or loss. You vomit blood or material that looks like coffee grounds. You are exposed to [place] measles and have never had them. You are exposed to fifth disease or chickenpox. You develop a severe headache. You have shortness of breath. You have any kind of trauma, such as from a fall or a car accident. Document Released: 11/21/2001 Document Revised: 04/13/2014 Document Reviewed: 10/07/2013 [facility] Patient Information 2015 [place], [place]. This information is not intended to replace advice given to you by your health care provider. Make sure you discuss any questions you have with your health care provider.  Coronavirus (COVID-19) Are you at risk?  Are you at risk for the Coronavirus (COVID-19)?  To be considered HIGH RISK for Coronavirus (COVID-19), you have to meet the following criteria:  Traveled to [place], [place], [place], [place] or [place];  and have fever, cough, and shortness of breath within the last 2 weeks of travel OR Been in close contact with a person diagnosed with COVID-19 within the last 2 weeks and have fever, cough, and shortness of breath IF YOU DO NOT MEET THESE CRITERIA, YOU ARE CONSIDERED LOW RISK FOR COVID-19.  What to do if you are HIGH RISK for COVID-19?  If you are having a medical emergency, call 911. Seek medical care right away. Before you go to a doctor's office, urgent care or emergency department, call ahead and tell them about your recent travel, contact with someone diagnosed with COVID-19, and your symptoms. You should receive instructions from your physician's office regarding next steps of care.  When you arrive at healthcare provider,  tell the healthcare staff immediately you have returned from visiting [place], [place], [place], [place] or [place]; in the last two weeks or you have been in close contact with a person diagnosed with COVID-19 in the last 2 weeks.  Tell the health care staff about your symptoms: fever, cough and shortness of breath. After you have been seen by a medical provider, you will be either: Tested for (COVID-19) and discharged home on quarantine except to seek medical care if symptoms worsen, and asked to  Stay home and avoid contact with others until you get your results (4-5 days)  Avoid travel on public transportation if possible (such as bus, train, or airplane) or Sent to the Emergency Department by EMS for evaluation, COVID-19 testing, and possible admission depending on your condition and test results.  What to do if you are LOW RISK for COVID-19?  Reduce your risk of any infection by using the same precautions used for avoiding the common cold or flu:  Wash your hands often with soap and warm water for at least 20 seconds.  If soap and water are not readily available, use an alcohol-based hand sanitizer with at least 60% alcohol.  If coughing or sneezing, cover your mouth and nose by coughing or sneezing into the elbow areas of your shirt or coat, into a tissue or into your sleeve (not your hands). Avoid shaking hands with others and consider head nods or verbal greetings only. Avoid touching your eyes, nose, or mouth with unwashed hands.  Avoid close contact with people who are sick. Avoid places or events with large numbers of people in one location, like concerts or sporting events. Carefully consider travel plans you have or are making. If you are planning any travel outside or inside the [place], visit the CDC's Travelers' Health webpage for the latest health notices. If you have some symptoms but not all symptoms, continue to monitor at home and seek medical attention if your symptoms worsen. If  you are having a medical emergency, call 911.   ADDITIONAL HEALTHCARE OPTIONS FOR [place] / e-Visit: [email or web address]         [facility]: [facility]: [id number]                   [facility]: [phone]     Safe Medications in Pregnancy   Acne: Benzoyl Peroxide Salicylic Acid  Backache/Headache: Tylenol: 2 regular strength every 4 hours OR              2 Extra strength every 6 hours  Colds/Coughs/Allergies: Benadryl (alcohol free) 25 mg every 6 hours as needed Breath right strips Claritin Cepacol throat lozenges Chloraseptic throat spray Cold-Eeze- up to three times per day Cough drops, alcohol free Flonase (by prescription only) Guaifenesin Mucinex Robitussin DM (plain only, alcohol free) Saline nasal spray/drops Sudafed (pseudoephedrine) & Actifed ** use only after [redacted] weeks gestation and if you do not have high blood pressure Tylenol Vicks Vaporub Zinc lozenges Zyrtec   Constipation: Colace Ducolax suppositories Fleet enema Glycerin suppositories Metamucil Milk of magnesia Miralax Senokot Smooth move tea  Diarrhea: Kaopectate Imodium A-D  *NO pepto Bismol  Hemorrhoids: Anusol Anusol HC Preparation H Tucks  Indigestion: Tums Maalox Mylanta Zantac  Pepcid  Insomnia: Benadryl (alcohol free) 36m every 6 hours as needed Tylenol PM Unisom, no Gelcaps  Leg Cramps: Tums MagGel  Nausea/Vomiting:  Bonine Dramamine Emetrol Ginger extract Sea bands Meclizine  Nausea medication to take during pregnancy:  Unisom (doxylamine succinate 25 mg tablets) Take one tablet daily at bedtime. If symptoms are not adequately controlled, the dose can be increased to a maximum recommended dose of two tablets daily (1/2 tablet in  the morning, 1/2 tablet mid-afternoon and one at bedtime). Vitamin B6 160m tablets. Take one  tablet twice a day (up to 200 mg per day).  Skin Rashes: Aveeno products Benadryl cream or 250mevery 6 hours as needed Calamine Lotion 1% cortisone cream  Yeast infection: Gyne-lotrimin 7 Monistat 7   **If taking multiple medications, please check labels to avoid duplicating the same active ingredients **take medication as directed on the label ** Do not exceed 4000 mg of tylenol in 24 hours **Do not take medications that contain aspirin or ibuprofen

## 2022-09-18 NOTE — Progress Notes (Signed)
INITIAL OBSTETRICAL VISIT Patient name: Andrea [name] MRN [id number]  Date of birth: 1996-10-21 Chief Complaint:   Initial Prenatal Visit  History of Present Illness:   Andrea [name] is a 26 y.o. G104P1011 Caucasian female at [redacted]w[redacted]d by [place] at 47 weeks with an Estimated Date of Delivery: 03/20/23 being seen today for her initial obstetrical visit.  Has been taking ginger root for nausea.  Her obstetrical history is significant for GHTN at term; D&E earlier this year for missed AB (dx at 16 weeks, but fetus measureed ~ 10 weeks, so 1st trimester MAB.  Genetics were normal, but fetus had abnormalities, ? Turner's syndrome).  Hx of opiate/benzo addiction.  Took suboxone until recently. Didn't want to be dependant on it during pregnancy.  Is determined to stay away from substance. .   Today she reports no complaints.     09/18/2022    2:52 PM 01/27/2022   11:24 AM 10/28/2018    3:38 PM  Depression screen PHQ 2/9  Decreased Interest 2 1 2   Down, Depressed, Hopeless 1 1 0  PHQ - 2 Score 3 2 2   Altered sleeping 3 1 3   Tired, decreased energy 3 1 3   Change in appetite 2 1 3   Feeling bad or failure about yourself  1 0 0  Trouble concentrating 1 1 3   Moving slowly or fidgety/restless 1 0 3  Suicidal thoughts 0 0 0  PHQ-9 Score 14 6 17   Difficult doing work/chores   Somewhat difficult    Patient's last menstrual period was 06/24/2022 (approximate). Last pap 10/28/18. Results were: normal Review of Systems:   Pertinent items are noted in HPI Denies cramping/contractions, leakage of fluid, vaginal bleeding, abnormal vaginal discharge w/ itching/odor/irritation, headaches, visual changes, shortness of breath, chest pain, abdominal pain, severe nausea/vomiting, or problems with urination or bowel movements unless otherwise stated above.  Pertinent History Reviewed:  Reviewed past medical,surgical, social, obstetrical and family history.  Reviewed problem list, medications and allergies. OB  History  Gravida Para Term Preterm AB Living  3 1 1  0 1 1  SAB IAB Ectopic Multiple Live Births  0 0 0 0 1    # Outcome Date GA Lbr Len/2nd Weight Sex Delivery Anes PTL Lv  3 Current           2 AB 02/24/22 [redacted]w[redacted]d   F    FD  1 Term 04/25/19 [redacted]w[redacted]d 04:15 / 04:20 6 lb 3.5 oz (2.82 kg) F Vag-Vacuum EPI N LIV     Complications: Gestational hypertension   Physical Assessment:   Vitals:   09/18/22 1438  Weight: 177 lb (80.3 kg)  Body mass index is 35.75 kg/m.       Physical Examination:  General appearance - well appearing, and in no distress  Mental status - alert, oriented to person, place, and time  Psych:  She has a normal mood and affect  Skin - warm and dry, normal color, no suspicious lesions noted  Chest - effort normal  Heart - normal rate and regular rhythm  Abdomen - soft, nontender  Extremities:  No swelling or varicosities noted  Pelvic - VULVA: normal appearing vulva with no masses, tenderness or lesions  VAGINA: normal appearing vagina with normal color and discharge, no lesions  CERVIX: normal appearing cervix without discharge or lesions, no CMT  Thin prep DECLINED TODAY  NT from 09/13/22:  10/30/18 13+1 wks,measurements c/w dates,CRL 72.61 mm,FHR 170 bpm,normal ovaries,NB present,NT 2.5 mm,posterior placenta   Results  for orders placed or performed in visit on 09/18/22 (from the past 24 hour(s))  POC Urinalysis Dipstick OB   Collection Time: 09/18/22  3:14 PM  Result Value Ref Range   Color, UA     Clarity, UA     Glucose, UA Negative Negative   Bilirubin, UA     Ketones, UA neg    Spec Grav, UA     Blood, UA trace    pH, UA     POC,PROTEIN,UA Negative Negative, Trace, Small (1+), Moderate (2+), Large (3+), 4+   Urobilinogen, UA     Nitrite, UA neg    Leukocytes, UA Negative Negative   Appearance     Odor      Assessment & Plan:  1) Low-Risk Pregnancy G3P1011 at [redacted]w[redacted]d with an Estimated Date of Delivery: 03/20/23   2) Initial OB visit  3) 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    Dispense:  60 tablet    Refill:  6    Order Specific Question:   Supervising Provider    Answer:   Tania Ade H [[id number]]    Initial labs obtained Continue prenatal vitamins Reviewed n/v relief measures and warning s/s to report Reviewed recommended weight gain based on pre-gravid BMI Encouraged well-balanced diet Genetic & carrier screening discussed: requests Panorama, NT/IT, and Horizon , declines AFP Ultrasound discussed; fetal survey: requested CCNC completed> form faxed if has or is planning to apply for medicaid The nature of [facility] for [employer] with multiple MDs and other Advanced Practice Providers was explained to patient; also emphasized that fellows, residents, and students are part of our team. Has home bp cuff. Check bp weekly, let us know if >140/90.        Joaquim Lai Cresenzo-Dishmon 4:10 PM

## 2022-09-20 LAB — INTEGRATED 1
Crown Rump Length: 72.6 mm
Gest. Age on Collection Date: 13.9 weeks
Maternal Age at EDD: 27.2 yr
Nuchal Translucency (NT): 2.5 mm
Number of Fetuses: 1
PAPP-A Value: 1050.6 ng/mL
Weight: 177 [lb_av]

## 2022-09-20 LAB — CBC/D/PLT+RPR+RH+ABO+RUBIGG...
Antibody Screen: NEGATIVE
Basophils Absolute: 0 10*3/uL (ref 0.0–0.2)
Basos: 0 %
EOS (ABSOLUTE): 0.1 10*3/uL (ref 0.0–0.4)
Eos: 1 %
HCV Ab: NONREACTIVE
HIV Screen 4th Generation wRfx: NONREACTIVE
Hematocrit: 38.7 % (ref 34.0–46.6)
Hemoglobin: 13 g/dL (ref 11.1–15.9)
Hepatitis B Surface Ag: NEGATIVE
Immature Grans (Abs): 0 10*3/uL (ref 0.0–0.1)
Immature Granulocytes: 0 %
Lymphocytes Absolute: 1.7 10*3/uL (ref 0.7–3.1)
Lymphs: 14 %
MCH: 29 pg (ref 26.6–33.0)
MCHC: 33.6 g/dL (ref 31.5–35.7)
MCV: 86 fL (ref 79–97)
Monocytes Absolute: 0.7 10*3/uL (ref 0.1–0.9)
Monocytes: 6 %
Neutrophils Absolute: 9.5 10*3/uL — ABNORMAL HIGH (ref 1.4–7.0)
Neutrophils: 79 %
Platelets: 269 10*3/uL (ref 150–450)
RBC: 4.48 x10E6/uL (ref 3.77–5.28)
RDW: 12.9 % (ref 11.7–15.4)
RPR Ser Ql: NONREACTIVE
Rh Factor: POSITIVE
Rubella Antibodies, IGG: 7.78 index (ref >0.99)
WBC: 12.1 10*3/uL — ABNORMAL HIGH (ref 3.4–10.8)

## 2022-09-20 LAB — PROTEIN / CREATININE RATIO, URINE
Creatinine, Urine: 142.7 mg/dL
Protein, Ur: 12.5 mg/dL
Protein/Creat Ratio: 88 mg/g creat (ref 0–200)

## 2022-09-20 LAB — COMPREHENSIVE METABOLIC PANEL
ALT: 13 IU/L (ref 0–32)
AST: 12 IU/L (ref 0–40)
Albumin/Globulin Ratio: 1.5 (ref 1.2–2.2)
Albumin: 4 g/dL (ref 4.0–5.0)
Alkaline Phosphatase: 63 IU/L (ref 44–121)
BUN/Creatinine Ratio: 14 (ref 9–23)
BUN: 8 mg/dL (ref 6–20)
Bilirubin Total: <0.2 mg/dL (ref 0.0–1.2)
CO2: 22 mmol/L (ref 20–29)
Calcium: 9.4 mg/dL (ref 8.7–10.2)
Chloride: 101 mmol/L (ref 96–106)
Creatinine, Ser: 0.56 mg/dL — ABNORMAL LOW (ref 0.57–1.00)
Globulin, Total: 2.6 g/dL (ref 1.5–4.5)
Glucose: 80 mg/dL (ref 70–99)
Potassium: 3.7 mmol/L (ref 3.5–5.2)
Sodium: 137 mmol/L (ref 134–144)
Total Protein: 6.6 g/dL (ref 6.0–8.5)
eGFR: 129 mL/min/{1.73_m2} (ref >59)

## 2022-09-20 LAB — URINE CULTURE

## 2022-09-20 LAB — GC/CHLAMYDIA PROBE AMP
Chlamydia trachomatis, NAA: NEGATIVE
Neisseria Gonorrhoeae by PCR: NEGATIVE

## 2022-09-20 LAB — HCV INTERPRETATION

## 2022-09-25 LAB — PANORAMA PRENATAL TEST FULL PANEL:PANORAMA TEST PLUS 5 ADDITIONAL MICRODELETIONS: FETAL FRACTION: 9.1

## 2022-10-09 ENCOUNTER — Ambulatory Visit (INDEPENDENT_AMBULATORY_CARE_PROVIDER_SITE_OTHER): Payer: BC Managed Care – PPO | Admitting: Obstetrics & Gynecology

## 2022-10-09 ENCOUNTER — Encounter: Payer: Self-pay | Admitting: Obstetrics & Gynecology

## 2022-10-09 VITALS — BP 119/79 | HR 101 | Wt 180.6 lb

## 2022-10-09 DIAGNOSIS — Z1379 Encounter for other screening for genetic and chromosomal anomalies: Secondary | ICD-10-CM

## 2022-10-09 DIAGNOSIS — Z3480 Encounter for supervision of other normal pregnancy, unspecified trimester: Secondary | ICD-10-CM

## 2022-10-09 DIAGNOSIS — Z3A16 16 weeks gestation of pregnancy: Secondary | ICD-10-CM

## 2022-10-09 NOTE — Progress Notes (Signed)
   LOW-RISK PREGNANCY VISIT Patient name: Andrea [name] MRN [id number]  Date of birth: Mar 24, 1996 Chief Complaint:   Routine Prenatal Visit  History of Present Illness:   Andrea [name] is a 26 y.o. G62P1011 female at [redacted]w[redacted]d with an Estimated Date of Delivery: 03/20/23 being seen today for ongoing management of a low-risk pregnancy.   -prior IUFD      09/18/2022    2:52 PM 01/27/2022   11:24 AM 10/28/2018    3:38 PM  Depression screen PHQ 2/9  Decreased Interest 2 1 2   Down, Depressed, Hopeless 1 1 0  PHQ - 2 Score 3 2 2   Altered sleeping 3 1 3   Tired, decreased energy 3 1 3   Change in appetite 2 1 3   Feeling bad or failure about yourself  1 0 0  Trouble concentrating 1 1 3   Moving slowly or fidgety/restless 1 0 3  Suicidal thoughts 0 0 0  PHQ-9 Score 14 6 17   Difficult doing work/chores   Somewhat difficult    Today she reports no complaints. Contractions: Not present. Denies vaginal bleeding  Denies contractions .   Marland Kitchen Denies leaking of fluid. Review of Systems:   Pertinent items are noted in HPI Denies abnormal vaginal discharge w/ itching/odor/irritation, headaches, visual changes, shortness of breath, chest pain, abdominal pain, severe nausea/vomiting, or problems with urination or bowel movements unless otherwise stated above. Pertinent History Reviewed:  Reviewed past medical,surgical, social, obstetrical and family history.  Reviewed problem list, medications and allergies.  Physical Assessment:   Vitals:   10/09/22 1352  BP: 119/79  Pulse: (!) 101  Weight: 180 lb 9.6 oz (81.9 kg)  Body mass index is 36.48 kg/m.        Physical Examination:   General appearance: Well appearing, and in no distress  Mental status: Alert, oriented to person, place, and time  Skin: Warm & dry  Respiratory: Normal respiratory effort, no distress  Abdomen: Soft, gravid, nontender  Pelvic: Cervical exam deferred         Extremities: Edema: None  Psych:  mood and affect  appropriate  Fetal Status: Fetal Heart Rate (bpm): 135        Chaperone: n/a    No results found for this or any previous visit (from the past 24 hour(s)).   Assessment & Plan:  1) Low-risk pregnancy G3P1011 at [redacted]w[redacted]d with an Estimated Date of Delivery: 03/20/23   2) h/o IUFD -on aspirin concerned about allergy- notes rash on her face/neck -ok to stop and monitor symptoms   Meds: No orders of the defined types were placed in this encounter.  Labs/procedures today: IT-2 - declined pap today, []  plan to do at next visit  Plan:  Continue routine obstetrical care  Next visit: prefers in person    Reviewed: Preterm labor symptoms and general obstetric precautions including but not limited to vaginal bleeding, contractions, leaking of fluid and fetal movement were reviewed in detail with the patient.  All questions were answered. Pt has home bp cuff. Check bp weekly, let us know if >140/90.   Follow-up: Return in about 4 weeks (around 11/06/2022) for [place] visit and anatom scan.  Orders Placed This Encounter  Procedures   INTEGRATED 2    Janyth Pupa, DO Attending [place], [facility] for [employer], [place]

## 2022-10-11 LAB — INTEGRATED 2
AFP MoM: 1.75
Alpha-Fetoprotein: 55.7 ng/mL
Crown Rump Length: 72.6 mm
DIA MoM: 1.39
DIA Value: 189.5 pg/mL
Estriol, Unconjugated: 1.85 ng/mL
Gest. Age on Collection Date: 13.9 weeks
Gestational Age: 16.9 weeks
Maternal Age at EDD: 27.2 yr
Nuchal Translucency (NT): 2.5 mm
Nuchal Translucency MoM: 1.5
Number of Fetuses: 1
PAPP-A MoM: 0.8
PAPP-A Value: 1050.6 ng/mL
Test Results:: NEGATIVE
Weight: 177 [lb_av]
Weight: 177 [lb_av]
hCG MoM: 6.87
hCG Value: 191.6 IU/mL
uE3 MoM: 1.79

## 2022-10-24 DIAGNOSIS — Z3201 Encounter for pregnancy test, result positive: Secondary | ICD-10-CM | POA: Diagnosis not present

## 2022-10-24 DIAGNOSIS — G4700 Insomnia, unspecified: Secondary | ICD-10-CM | POA: Diagnosis not present

## 2022-10-24 DIAGNOSIS — F1120 Opioid dependence, uncomplicated: Secondary | ICD-10-CM | POA: Diagnosis not present

## 2022-10-24 DIAGNOSIS — F419 Anxiety disorder, unspecified: Secondary | ICD-10-CM | POA: Diagnosis not present

## 2022-10-25 DIAGNOSIS — F1120 Opioid dependence, uncomplicated: Secondary | ICD-10-CM | POA: Diagnosis not present

## 2022-10-25 DIAGNOSIS — G4700 Insomnia, unspecified: Secondary | ICD-10-CM | POA: Diagnosis not present

## 2022-10-25 DIAGNOSIS — F419 Anxiety disorder, unspecified: Secondary | ICD-10-CM | POA: Diagnosis not present

## 2022-10-27 ENCOUNTER — Other Ambulatory Visit: Payer: Self-pay | Admitting: Obstetrics & Gynecology

## 2022-10-27 DIAGNOSIS — Z363 Encounter for antenatal screening for malformations: Secondary | ICD-10-CM

## 2022-10-30 ENCOUNTER — Ambulatory Visit (INDEPENDENT_AMBULATORY_CARE_PROVIDER_SITE_OTHER): Payer: BC Managed Care – PPO

## 2022-10-30 ENCOUNTER — Ambulatory Visit (INDEPENDENT_AMBULATORY_CARE_PROVIDER_SITE_OTHER): Payer: BC Managed Care – PPO | Admitting: Women's Health

## 2022-10-30 ENCOUNTER — Encounter: Payer: BC Managed Care – PPO | Admitting: Obstetrics & Gynecology

## 2022-10-30 ENCOUNTER — Other Ambulatory Visit (HOSPITAL_COMMUNITY)
Admission: RE | Admit: 2022-10-30 | Discharge: 2022-10-30 | Disposition: A | Discharge status: Home / Self Care | Payer: BC Managed Care – PPO | Source: Ambulatory Visit | Attending: Women's Health | Admitting: Women's Health

## 2022-10-30 ENCOUNTER — Encounter: Payer: Self-pay | Admitting: Women's Health

## 2022-10-30 VITALS — BP 127/81 | HR 86 | Wt 179.0 lb

## 2022-10-30 DIAGNOSIS — Z3A19 19 weeks gestation of pregnancy: Secondary | ICD-10-CM

## 2022-10-30 DIAGNOSIS — F1120 Opioid dependence, uncomplicated: Secondary | ICD-10-CM | POA: Insufficient documentation

## 2022-10-30 DIAGNOSIS — Z113 Encounter for screening for infections with a predominantly sexual mode of transmission: Secondary | ICD-10-CM | POA: Insufficient documentation

## 2022-10-30 DIAGNOSIS — Z01419 Encounter for gynecological examination (general) (routine) without abnormal findings: Secondary | ICD-10-CM

## 2022-10-30 DIAGNOSIS — Z3480 Encounter for supervision of other normal pregnancy, unspecified trimester: Secondary | ICD-10-CM

## 2022-10-30 DIAGNOSIS — Z3482 Encounter for supervision of other normal pregnancy, second trimester: Secondary | ICD-10-CM

## 2022-10-30 DIAGNOSIS — Z363 Encounter for antenatal screening for malformations: Secondary | ICD-10-CM

## 2022-10-30 DIAGNOSIS — F419 Anxiety disorder, unspecified: Secondary | ICD-10-CM | POA: Diagnosis not present

## 2022-10-30 DIAGNOSIS — O26842 Uterine size-date discrepancy, second trimester: Secondary | ICD-10-CM

## 2022-10-30 DIAGNOSIS — O99320 Drug use complicating pregnancy, unspecified trimester: Secondary | ICD-10-CM

## 2022-10-30 DIAGNOSIS — Z8759 Personal history of other complications of pregnancy, childbirth and the puerperium: Secondary | ICD-10-CM

## 2022-10-30 NOTE — Progress Notes (Signed)
[place] 19+6 wks,cephalic,FHR 148 bpm,posterior placenta gr 0,CX 3.4 cm,SVP of fluid 3.5 cm,normal ovaries,EFW 272 g 12%,AC 25%,BPD 8%,anatomy complete,no obvious abnormalities

## 2022-10-30 NOTE — Progress Notes (Signed)
LOW-RISK PREGNANCY VISIT Patient name: Andrea [name] MRN [id number]  Date of birth: March 25, 1996 Chief Complaint:   Routine Prenatal Visit  History of Present Illness:   Andrea [name] is a 26 y.o. G23P1011 female at [redacted]w[redacted]d with an Estimated Date of Delivery: 03/20/23 being seen today for ongoing management of a low-risk pregnancy.   Today she reports  on suboxone 2mg  (1/4 of 8mg ) daily from in [place], doing well w/ this dosage. Wants to keep it as low as possible.  . Contractions: Not present. Vag. Bleeding: None.  Movement: Present. denies leaking of fluid.     09/18/2022    2:52 PM 01/27/2022   11:24 AM 10/28/2018    3:38 PM  Depression screen PHQ 2/9  Decreased Interest 2 1 2   Down, Depressed, Hopeless 1 1 0  PHQ - 2 Score 3 2 2   Altered sleeping 3 1 3   Tired, decreased energy 3 1 3   Change in appetite 2 1 3   Feeling bad or failure about yourself  1 0 0  Trouble concentrating 1 1 3   Moving slowly or fidgety/restless 1 0 3  Suicidal thoughts 0 0 0  PHQ-9 Score 14 6 17   Difficult doing work/chores   Somewhat difficult        09/18/2022    2:52 PM 01/27/2022   11:24 AM  GAD 7 : Generalized Anxiety Score  Nervous, Anxious, on Edge 2 1  Control/stop worrying 2 1  Worry too much - different things 2 1  Trouble relaxing 2 1  Restless 2 1  Easily annoyed or irritable 2 1  Afraid - awful might happen 3 1  Total GAD 7 Score 15 7      Review of Systems:   Pertinent items are noted in HPI Denies abnormal vaginal discharge w/ itching/odor/irritation, headaches, visual changes, shortness of breath, chest pain, abdominal pain, severe nausea/vomiting, or problems with urination or bowel movements unless otherwise stated above. Pertinent History Reviewed:  Reviewed past medical,surgical, social, obstetrical and family history.  Reviewed problem list, medications and allergies. Physical Assessment:   Vitals:   10/30/22 1000  BP: 127/81  Pulse: 86   Weight: 179 lb (81.2 kg)  Body mass index is 36.15 kg/m.        Physical Examination:   General appearance: Well appearing, and in no distress  Mental status: Alert, oriented to person, place, and time  Skin: Warm & dry  Cardiovascular: Normal heart rate noted  Respiratory: Normal respiratory effort, no distress  Abdomen: Soft, gravid, nontender  Pelvic:  thin prep pap obtained          Extremities: Edema: Trace  Fetal Status: Fetal Heart Rate (bpm): 148 u/s   Movement: Present  19+6 wks,cephalic,FHR 148 bpm,posterior placenta gr 0,CX 3.4 cm,SVP of fluid 3.5 cm,normal ovaries,EFW 272 g 12%,AC 25%,BPD 8%,anatomy complete,no obvious abnormalities   Chaperone: N/A   No results found for this or any previous visit (from the past 24 hour(s)).  Assessment & Plan:  1) Low-risk pregnancy G3P1011 at [redacted]w[redacted]d with an Estimated Date of Delivery: 03/20/23   2) Suboxone therapy, 2mg  (1/4 of 8mg ) daily from in [place], discussed [facility] referral- declines  3) H/O GHTN> stopped ASA d/t rash on face/neck  4) Fetal EFW 12% today, repeat in 4wks   Meds: No orders of the defined types were placed in this encounter.  Labs/procedures today: pap and U/S  Plan:  Continue routine obstetrical care  Next  visit: prefers in person    Reviewed: Preterm labor symptoms and general obstetric precautions including but not limited to vaginal bleeding, contractions, leaking of fluid and fetal movement were reviewed in detail with the patient.  All questions were answered. Does have home bp cuff. Office bp cuff given: not applicable. Check bp weekly, let us know if consistently >140 and/or >90.  Follow-up: Return in about 4 weeks (around 11/27/2022) for LROB, US:EFW, MD or CNM, in person.  Future Appointments  Date Time Provider Department Center  11/27/2022  3:45 PM [facility] [place] [facility] None  11/27/2022  4:30 PM Eure, Amaryllis Dyke, MD [facility]    Orders Placed This Encounter   Procedures   US OB Follow Up   Cheral Marker CNM, [facility] 10/30/2022 10:52 AM

## 2022-10-30 NOTE — Patient Instructions (Signed)
Andrea [name], thank you for choosing our office today! We appreciate the opportunity to meet your healthcare needs. You may receive a short survey by mail, e-mail, or through [employer]. If you are happy with your care we would appreciate if you could take just a few minutes to complete the survey questions. We read all of your comments and take your feedback very seriously. Thank you again for choosing our office.  Center for [employer] Team at [facility] at [facility] ([address] [place], [place] [address]) Entrance C, located off of [address] Free 24/7 valet parking  Go to [email or web address] to register for FREE online childbirth classes  Call the office [phone]) or go to [facility] if: You begin to severe cramping Your water breaks.  Sometimes it is a big gush of fluid, sometimes it is just a trickle that keeps getting your panties wet or running down your legs You have vaginal bleeding.  It is normal to have a small amount of spotting if your cervix was checked.   [facility] Pediatricians/Family Doctors [facility]): [address] [place], [phone]           [facility]: [address], [phone]                [facility] Medicine [facility]): [address], [phone] (call to ask if accepting patients) [facility] Department: [address], [place], [phone]    [facility] Pediatricians/Family Doctors [facility]): [phone] [address], [phone] [facility]: [address] [place], [phone] [facility] [place]: [address], [phone]  [facility] Doctors  [facility]): [phone] [facility]: [address] [phone]   [facility] Doctors [facility]: [address] [phone]  [facility] Doctors  [facility]: [phone], [phone]  Home Blood Pressure Monitoring for Patients   Your provider has recommended that you check your blood pressure (BP) at least once a week at home. If you do not have a blood pressure cuff at home, one will be provided for you. Contact your provider if you have not received your monitor within 1 week.   Helpful Tips for Accurate Home Blood Pressure Checks  Don't smoke, exercise, or drink caffeine 30 minutes before checking your BP Use the restroom before checking your BP (a full bladder can raise your pressure) Relax in a comfortable upright chair Feet on the ground Left arm resting comfortably on a flat surface at the level of your heart Legs uncrossed Back supported Sit quietly and don't talk Place the cuff on your bare arm Adjust snuggly, so that only two fingertips can fit between your skin and the top of the cuff Check 2 readings separated by at least one minute Keep a log of your BP readings For a visual, please reference this diagram: http://ccnc.care/bpdiagram  Provider Name: [facility]     Phone: [phone]  Zone 1: ALL CLEAR  Continue to monitor your symptoms:  BP reading is less than 140 (top number) or less than 90 (bottom number)  No right upper stomach pain No headaches or seeing spots No feeling nauseated or throwing up No swelling in face and hands  Zone 2: CAUTION Call your doctor's office for any of the following:  BP reading is greater than 140 (top number) or greater than  90 (bottom number)  Stomach pain under your ribs in the middle or right side Headaches or seeing spots Feeling nauseated or throwing up Swelling in face and hands  Zone 3: EMERGENCY  Seek immediate medical care if you have any of the following:  BP reading is greater than160 (top number) or greater than 110 (bottom number) Severe headaches not improving with Tylenol Serious difficulty catching your breath Any worsening symptoms from  Zone 2     Second Trimester of Pregnancy The second trimester is from week 14 through week 27 (months 4 through 6). The second trimester is often a time when you feel your best. Your body has adjusted to being pregnant, and you begin to feel better physically. Usually, morning sickness has lessened or quit completely, you may have more energy, and you may have an increase in appetite. The second trimester is also a time when the fetus is growing rapidly. At the end of the sixth month, the fetus is about 9 inches long and weighs about 1 pounds. You will likely begin to feel the baby move (quickening) between 16 and 20 weeks of pregnancy. Body changes during your second trimester Your body continues to go through many changes during your second trimester. The changes vary from woman to woman. Your weight will continue to increase. You will notice your lower abdomen bulging out. You may begin to get stretch marks on your hips, abdomen, and breasts. You may develop headaches that can be relieved by medicines. The medicines should be approved by your health care provider. You may urinate more often because the fetus is pressing on your bladder. You may develop or continue to have heartburn as a result of your pregnancy. You may develop constipation because certain hormones are causing the muscles that push waste through your intestines to slow down. You may develop hemorrhoids or swollen, bulging veins (varicose veins). You may have back pain. This is caused by: Weight gain. Pregnancy hormones that are relaxing the joints in your pelvis. A shift in weight and the muscles that support your balance. Your breasts will continue to grow and they will continue to become tender. Your gums may bleed and may be sensitive to brushing and flossing. Dark spots or blotches (chloasma, mask of pregnancy) may develop on your face. This will likely fade after the baby is born. A dark line from your belly button to  the pubic area (linea nigra) may appear. This will likely fade after the baby is born. You may have changes in your hair. These can include thickening of your hair, rapid growth, and changes in texture. Some women also have hair loss during or after pregnancy, or hair that feels dry or thin. Your hair will most likely return to normal after your baby is born.  What to expect at prenatal visits During a routine prenatal visit: You will be weighed to make sure you and the fetus are growing normally. Your blood pressure will be taken. Your abdomen will be measured to track your baby's growth. The fetal heartbeat will be listened to. Any test results from the previous visit will be discussed.  Your health care provider may ask you: How you are feeling. If you are feeling the baby move. If you have had any abnormal symptoms, such as leaking fluid, bleeding, severe headaches, or abdominal cramping. If you are using any tobacco products, including cigarettes, chewing tobacco, and electronic cigarettes. If you have any questions.  Other tests that may be performed during   your second trimester include: Blood tests that check for: Low iron levels (anemia). High blood sugar that affects pregnant women (gestational diabetes) between 24 and 28 weeks. Rh antibodies. This is to check for a protein on red blood cells (Rh factor). Urine tests to check for infections, diabetes, or protein in the urine. An ultrasound to confirm the proper growth and development of the baby. An amniocentesis to check for possible genetic problems. Fetal screens for spina bifida and Down syndrome. HIV (human immunodeficiency virus) testing. Routine prenatal testing includes screening for HIV, unless you choose not to have this test.  Follow these instructions at home: Medicines Follow your health care provider's instructions regarding medicine use. Specific medicines may be either safe or unsafe to take during  pregnancy. Take a prenatal vitamin that contains at least 600 micrograms (mcg) of folic acid. If you develop constipation, try taking a stool softener if your health care provider approves. Eating and drinking Eat a balanced diet that includes fresh fruits and vegetables, whole grains, good sources of protein such as meat, eggs, or tofu, and low-fat dairy. Your health care provider will help you determine the amount of weight gain that is right for you. Avoid raw meat and uncooked cheese. These carry germs that can cause birth defects in the baby. If you have low calcium intake from food, talk to your health care provider about whether you should take a daily calcium supplement. Limit foods that are high in fat and processed sugars, such as fried and sweet foods. To prevent constipation: Drink enough fluid to keep your urine clear or pale yellow. Eat foods that are high in fiber, such as fresh fruits and vegetables, whole grains, and beans. Activity Exercise only as directed by your health care provider. Most women can continue their usual exercise routine during pregnancy. Try to exercise for 30 minutes at least 5 days a week. Stop exercising if you experience uterine contractions. Avoid heavy lifting, wear low heel shoes, and practice good posture. A sexual relationship may be continued unless your health care provider directs you otherwise. Relieving pain and discomfort Wear a good support bra to prevent discomfort from breast tenderness. Take warm sitz baths to soothe any pain or discomfort caused by hemorrhoids. Use hemorrhoid cream if your health care provider approves. Rest with your legs elevated if you have leg cramps or low back pain. If you develop varicose veins, wear support hose. Elevate your feet for 15 minutes, 3-4 times a day. Limit salt in your diet. Prenatal Care Write down your questions. Take them to your prenatal visits. Keep all your prenatal visits as told by your health  care provider. This is important. Safety Wear your seat belt at all times when driving. Make a list of emergency phone numbers, including numbers for family, friends, the hospital, and police and fire departments. General instructions Ask your health care provider for a referral to a local prenatal education class. Begin classes no later than the beginning of month 6 of your pregnancy. Ask for help if you have counseling or nutritional needs during pregnancy. Your health care provider can offer advice or refer you to specialists for help with various needs. Do not use hot tubs, steam rooms, or saunas. Do not douche or use tampons or scented sanitary pads. Do not cross your legs for long periods of time. Avoid cat litter boxes and soil used by cats. These carry germs that can cause birth defects in the baby and possibly loss of the   fetus by miscarriage or stillbirth. Avoid all smoking, herbs, alcohol, and unprescribed drugs. Chemicals in these products can affect the formation and growth of the baby. Do not use any products that contain nicotine or tobacco, such as cigarettes and e-cigarettes. If you need help quitting, ask your health care provider. Visit your dentist if you have not gone yet during your pregnancy. Use a soft toothbrush to brush your teeth and be gentle when you floss. Contact a health care provider if: You have dizziness. You have mild pelvic cramps, pelvic pressure, or nagging pain in the abdominal area. You have persistent nausea, vomiting, or diarrhea. You have a bad smelling vaginal discharge. You have pain when you urinate. Get help right away if: You have a fever. You are leaking fluid from your vagina. You have spotting or bleeding from your vagina. You have severe abdominal cramping or pain. You have rapid weight gain or weight loss. You have shortness of breath with chest pain. You notice sudden or extreme swelling of your face, hands, ankles, feet, or legs. You  have not felt your baby move in over an hour. You have severe headaches that do not go away when you take medicine. You have vision changes. Summary The second trimester is from week 14 through week 27 (months 4 through 6). It is also a time when the fetus is growing rapidly. Your body goes through many changes during pregnancy. The changes vary from woman to woman. Avoid all smoking, herbs, alcohol, and unprescribed drugs. These chemicals affect the formation and growth your baby. Do not use any tobacco products, such as cigarettes, chewing tobacco, and e-cigarettes. If you need help quitting, ask your health care provider. Contact your health care provider if you have any questions. Keep all prenatal visits as told by your health care provider. This is important. This information is not intended to replace advice given to you by your health care provider. Make sure you discuss any questions you have with your health care provider. Document Released: 11/21/2001 Document Revised: 05/04/2016 Document Reviewed: 01/28/2013 Elsevier Interactive Patient Education  2017 [employer] Inc.  

## 2022-10-31 DIAGNOSIS — N200 Calculus of kidney: Secondary | ICD-10-CM | POA: Diagnosis not present

## 2022-10-31 DIAGNOSIS — G4700 Insomnia, unspecified: Secondary | ICD-10-CM | POA: Diagnosis not present

## 2022-10-31 DIAGNOSIS — F419 Anxiety disorder, unspecified: Secondary | ICD-10-CM | POA: Diagnosis not present

## 2022-10-31 DIAGNOSIS — F1120 Opioid dependence, uncomplicated: Secondary | ICD-10-CM | POA: Diagnosis not present

## 2022-11-01 LAB — CYTOLOGY - PAP
Chlamydia: NEGATIVE
Comment: NEGATIVE
Comment: NEGATIVE
Comment: NORMAL
Diagnosis: NEGATIVE
Diagnosis: REACTIVE
High risk HPV: NEGATIVE
Neisseria Gonorrhea: NEGATIVE

## 2022-11-06 DIAGNOSIS — F419 Anxiety disorder, unspecified: Secondary | ICD-10-CM | POA: Diagnosis not present

## 2022-11-06 DIAGNOSIS — F1120 Opioid dependence, uncomplicated: Secondary | ICD-10-CM | POA: Diagnosis not present

## 2022-11-07 ENCOUNTER — Encounter (HOSPITAL_COMMUNITY): Payer: Self-pay | Admitting: Emergency Medicine

## 2022-11-07 ENCOUNTER — Emergency Department (HOSPITAL_COMMUNITY)
Admission: EM | Admit: 2022-11-07 | Discharge: 2022-11-07 | Disposition: A | Discharge status: Home / Self Care | Payer: BC Managed Care – PPO | Attending: Emergency Medicine | Admitting: Emergency Medicine

## 2022-11-07 ENCOUNTER — Emergency Department (HOSPITAL_COMMUNITY): Payer: BC Managed Care – PPO

## 2022-11-07 ENCOUNTER — Other Ambulatory Visit: Payer: BC Managed Care – PPO

## 2022-11-07 ENCOUNTER — Other Ambulatory Visit: Payer: Self-pay

## 2022-11-07 DIAGNOSIS — N9489 Other specified conditions associated with female genital organs and menstrual cycle: Secondary | ICD-10-CM | POA: Insufficient documentation

## 2022-11-07 DIAGNOSIS — O26892 Other specified pregnancy related conditions, second trimester: Secondary | ICD-10-CM | POA: Diagnosis not present

## 2022-11-07 DIAGNOSIS — R319 Hematuria, unspecified: Secondary | ICD-10-CM | POA: Diagnosis not present

## 2022-11-07 DIAGNOSIS — Z3A21 21 weeks gestation of pregnancy: Secondary | ICD-10-CM | POA: Diagnosis not present

## 2022-11-07 DIAGNOSIS — Z3A22 22 weeks gestation of pregnancy: Secondary | ICD-10-CM

## 2022-11-07 DIAGNOSIS — R109 Unspecified abdominal pain: Secondary | ICD-10-CM

## 2022-11-07 LAB — CBC WITH DIFFERENTIAL/PLATELET
Abs Immature Granulocytes: 0.1 10*3/uL — ABNORMAL HIGH (ref 0.00–0.07)
Basophils Absolute: 0 10*3/uL (ref 0.0–0.1)
Basophils Relative: 0 %
Eosinophils Absolute: 0.1 10*3/uL (ref 0.0–0.5)
Eosinophils Relative: 0 %
HCT: 34.6 % — ABNORMAL LOW (ref 36.0–46.0)
Hemoglobin: 12 g/dL (ref 12.0–15.0)
Immature Granulocytes: 1 %
Lymphocytes Relative: 10 %
Lymphs Abs: 1.5 10*3/uL (ref 0.7–4.0)
MCH: 29.6 pg (ref 26.0–34.0)
MCHC: 34.7 g/dL (ref 30.0–36.0)
MCV: 85.2 fL (ref 80.0–100.0)
Monocytes Absolute: 0.7 10*3/uL (ref 0.1–1.0)
Monocytes Relative: 5 %
Neutro Abs: 11.7 10*3/uL — ABNORMAL HIGH (ref 1.7–7.7)
Neutrophils Relative %: 84 %
Platelets: 276 10*3/uL (ref 150–400)
RBC: 4.06 MIL/uL (ref 3.87–5.11)
RDW: 13.8 % (ref 11.5–15.5)
WBC: 14.1 10*3/uL — ABNORMAL HIGH (ref 4.0–10.5)
nRBC: 0 % (ref 0.0–0.2)

## 2022-11-07 LAB — URINALYSIS, ROUTINE W REFLEX MICROSCOPIC
Bilirubin Urine: NEGATIVE
Glucose, UA: NEGATIVE mg/dL
Ketones, ur: NEGATIVE mg/dL
Nitrite: NEGATIVE
Protein, ur: 30 mg/dL — AB
Specific Gravity, Urine: 1.008 (ref 1.005–1.030)
pH: 6 (ref 5.0–8.0)

## 2022-11-07 LAB — BASIC METABOLIC PANEL
Anion gap: 10 (ref 5–15)
BUN: 7 mg/dL (ref 6–20)
CO2: 22 mmol/L (ref 22–32)
Calcium: 8.8 mg/dL — ABNORMAL LOW (ref 8.9–10.3)
Chloride: 103 mmol/L (ref 98–111)
Creatinine, Ser: 0.7 mg/dL (ref 0.44–1.00)
GFR, Estimated: >60 mL/min (ref >60)
Glucose, Bld: 114 mg/dL — ABNORMAL HIGH (ref 70–99)
Potassium: 3.1 mmol/L — ABNORMAL LOW (ref 3.5–5.1)
Sodium: 135 mmol/L (ref 135–145)

## 2022-11-07 LAB — HCG, QUANTITATIVE, PREGNANCY: hCG, Beta Chain, Quant, S: 145754 m[IU]/mL — ABNORMAL HIGH (ref <5)

## 2022-11-07 MED ORDER — SODIUM CHLORIDE 0.9 % IV BOLUS
500.0000 mL | Freq: Once | INTRAVENOUS | Status: AC
  Administered 2022-11-07: 500 mL via INTRAVENOUS

## 2022-11-07 MED ORDER — METOCLOPRAMIDE HCL 5 MG/ML IJ SOLN
10.0000 mg | Freq: Once | INTRAMUSCULAR | Status: AC
  Administered 2022-11-07: 10 mg via INTRAVENOUS
  Filled 2022-11-07: qty 2

## 2022-11-07 NOTE — ED Provider Notes (Signed)
Urinalysis showed some blood in the urine but no bacteria no white blood cells.  Not consistent with urinary tract infection.  Urine culture sent just in case.  Patient got ultrasound kidney stone study that showed stones in both kidneys but nothing in the ureter.  Some mild hydronephrosis of the 1 ureter.  Will have [facility] fetal heart rates been good here.  And have patient follow-up with urology.   Vanetta Mulders, MD 11/07/22 1351

## 2022-11-07 NOTE — ED Triage Notes (Signed)
Pt here for c/o R sided flank pain that started this am. States yesterday she started having burning with urination. States she is not sure if she has a kidney stone but states that she cannot tell because it hurts in her abdomen too. Pt with hx of miscarriage with 2nd child. Pt reports feeling like she is peeing glass when she urinates and now reports that she feels like she is urinating on self.

## 2022-11-07 NOTE — Discharge Instructions (Addendum)
Workup today as showed some blood in the urine.  Showed some kidney stones up in the kidneys but none in the ureter.  Urine culture sent.  Will notify if it grows any significant bacteria.  Make an appointment to follow-up with OB/GYN.  Also given a referral to urology since you have stones up in the kidney area and you are currently pregnant.

## 2022-11-07 NOTE — ED Provider Notes (Signed)
[facility] EMERGENCY DEPARTMENT Provider Note   CSN: [id number] Arrival date & time: 11/07/22  7026     History  Chief Complaint  Patient presents with   Flank Pain    Andrea [name] is a 26 y.o. female.  Patient presents to the emergency department for evaluation of right flank pain.  Patient reports that she started having urinary frequency and dysuria yesterday.  She is now experiencing pain in the right flank area and dysuria is significantly worse.  She does have a history of kidney stones.  Patient reports that she is [redacted] weeks pregnant.       Home Medications Prior to Admission medications   Medication Sig Start Date End Date Taking? Authorizing Provider  albuterol (VENTOLIN HFA) 108 (90 Base) MCG/ACT inhaler Inhale 2 puffs into the lungs every 6 (six) hours as needed for wheezing or shortness of breath. Patient not taking: Reported on 07/26/2022 04/27/19   Tamera Stands, DO  aspirin EC 81 MG tablet Take 2 tablets (162 mg total) by mouth daily. Patient not taking: Reported on 10/30/2022 09/18/22   Cresenzo-Dishmon, Scarlette Calico, CNM  buprenorphine (SUBUTEX) 8 MG SUBL SL tablet Place under the tongue. 10/24/22   [provider]  doxylamine, Sleep, (UNISOM) 25 MG tablet Take 25 mg by mouth at bedtime as needed.    [provider]  Prenatal Vit-Fe Fumarate-FA (PRENATAL VITAMIN PO) Take by mouth.    [provider]      Allergies    Banana and Tylenol [acetaminophen]    Review of Systems   Review of Systems  Physical Exam Updated Vital Signs BP (!) 145/84 (BP Location: Right Arm)   Pulse (!) 104   Temp 97.8 F (36.6 C) (Oral)   Resp (!) 22   Ht 4\' 11"  (1.499 m)   Wt 81.2 kg   LMP 06/24/2022 (Approximate)   SpO2 100%   BMI 36.15 kg/m  Physical Exam Vitals and nursing note reviewed.  Constitutional:      General: She is not in acute distress.    Appearance: She is well-developed.  HENT:     Head: Normocephalic and atraumatic.      Mouth/Throat:     Mouth: Mucous membranes are moist.  Eyes:     General: Vision grossly intact. Gaze aligned appropriately.     Extraocular Movements: Extraocular movements intact.     Conjunctiva/sclera: Conjunctivae normal.  Cardiovascular:     Rate and Rhythm: Normal rate and regular rhythm.     Pulses: Normal pulses.     Heart sounds: Normal heart sounds, S1 normal and S2 normal. No murmur heard.    No friction rub. No gallop.  Pulmonary:     Effort: Pulmonary effort is normal. No respiratory distress.     Breath sounds: Normal breath sounds.  Abdominal:     General: Bowel sounds are normal.     Palpations: Abdomen is soft.     Tenderness: There is no abdominal tenderness. There is right CVA tenderness. There is no guarding or rebound.     Hernia: No hernia is present.  Musculoskeletal:        General: No swelling.     Cervical back: Full passive range of motion without pain, normal range of motion and neck supple. No spinous process tenderness or muscular tenderness. Normal range of motion.     Right lower leg: No edema.     Left lower leg: No edema.  Skin:    General: Skin is  warm and dry.     Capillary Refill: Capillary refill takes less than 2 seconds.     Findings: No ecchymosis, erythema, rash or wound.  Neurological:     General: No focal deficit present.     Mental Status: She is alert and oriented to person, place, and time.     GCS: GCS eye subscore is 4. GCS verbal subscore is 5. GCS motor subscore is 6.     Cranial Nerves: Cranial nerves 2-12 are intact.     Sensory: Sensation is intact.     Motor: Motor function is intact.     Coordination: Coordination is intact.  Psychiatric:        Attention and Perception: Attention normal.        Mood and Affect: Mood normal.        Speech: Speech normal.        Behavior: Behavior normal.     ED Results / Procedures / Treatments   Labs (all labs ordered are listed, but only abnormal results are displayed) Labs  Reviewed  URINALYSIS, ROUTINE W REFLEX MICROSCOPIC  CBC WITH DIFFERENTIAL/PLATELET  BASIC METABOLIC PANEL  HCG, QUANTITATIVE, PREGNANCY    EKG None  Radiology No results found.  Procedures Procedures    Medications Ordered in ED Medications - No data to display  ED Course/ Medical Decision Making/ A&P                           Medical Decision Making Amount and/or Complexity of Data Reviewed Labs: ordered.   Patient with UTI symptoms and now developing flank pain.  She does have a history of kidney stones.  Awaiting labs including urinalysis.  If urinalysis does not differentiate, will consider additional imaging to rule out stone. Signed out to oncoming ER physician with labs pending        Final Clinical Impression(s) / ED Diagnoses Final diagnoses:  Flank pain    Rx / DC Orders ED Discharge Orders     None         [name], Gwenyth Allegra, MD 11/07/22 [phone]

## 2022-11-08 LAB — URINE CULTURE: Culture: NO GROWTH

## 2022-11-16 DIAGNOSIS — F1120 Opioid dependence, uncomplicated: Secondary | ICD-10-CM | POA: Diagnosis not present

## 2022-11-16 DIAGNOSIS — G4700 Insomnia, unspecified: Secondary | ICD-10-CM | POA: Diagnosis not present

## 2022-11-16 DIAGNOSIS — F419 Anxiety disorder, unspecified: Secondary | ICD-10-CM | POA: Diagnosis not present

## 2022-11-27 ENCOUNTER — Other Ambulatory Visit: Payer: Self-pay | Admitting: Women's Health

## 2022-11-27 ENCOUNTER — Encounter: Payer: Self-pay | Admitting: Obstetrics & Gynecology

## 2022-11-27 ENCOUNTER — Ambulatory Visit (INDEPENDENT_AMBULATORY_CARE_PROVIDER_SITE_OTHER): Payer: Medicaid Other

## 2022-11-27 ENCOUNTER — Ambulatory Visit (INDEPENDENT_AMBULATORY_CARE_PROVIDER_SITE_OTHER): Payer: Medicaid Other | Admitting: Obstetrics & Gynecology

## 2022-11-27 VITALS — BP 135/86 | HR 94 | Wt 183.0 lb

## 2022-11-27 DIAGNOSIS — O26842 Uterine size-date discrepancy, second trimester: Secondary | ICD-10-CM

## 2022-11-27 DIAGNOSIS — O0992 Supervision of high risk pregnancy, unspecified, second trimester: Secondary | ICD-10-CM

## 2022-11-27 DIAGNOSIS — Z3A23 23 weeks gestation of pregnancy: Secondary | ICD-10-CM | POA: Diagnosis not present

## 2022-11-27 DIAGNOSIS — F1121 Opioid dependence, in remission: Secondary | ICD-10-CM

## 2022-11-27 DIAGNOSIS — Z8759 Personal history of other complications of pregnancy, childbirth and the puerperium: Secondary | ICD-10-CM

## 2022-11-27 DIAGNOSIS — Z3482 Encounter for supervision of other normal pregnancy, second trimester: Secondary | ICD-10-CM

## 2022-11-27 DIAGNOSIS — O365990 Maternal care for other known or suspected poor fetal growth, unspecified trimester, not applicable or unspecified: Secondary | ICD-10-CM

## 2022-11-27 DIAGNOSIS — Z3480 Encounter for supervision of other normal pregnancy, unspecified trimester: Secondary | ICD-10-CM

## 2022-11-27 NOTE — Progress Notes (Addendum)
[place] 23+6 wks,cephalic, 2.6 cm,posterior placenta gr 0,SVP of fluid 7 cm,FHR 146 bpm,EFW 526 g 6.9%,AC 21%,RI .68,.76,.68=46%

## 2022-11-27 NOTE — Progress Notes (Signed)
HIGH-RISK PREGNANCY VISIT Patient name: Andrea [name] MRN [id number]  Date of birth: [birth year] Chief Complaint:   Routine Prenatal Visit  History of Present Illness:   Andrea [name] is a 26 y.o. G9P1011 female at [redacted]w[redacted]d with an Estimated Date of Delivery: 03/20/23 being seen today for ongoing management of a high-risk pregnancy complicated by fetal growth restriction 7% w/normal UAD.    Today she reports no complaints.  . Vag. Bleeding: None.  Movement: Present. denies leaking of fluid.      09/18/2022    2:52 PM 01/27/2022   11:24 AM 10/28/2018    3:38 PM  Depression screen PHQ 2/9  Decreased Interest 2 1 2   Down, Depressed, Hopeless 1 1 0  PHQ - 2 Score 3 2 2   Altered sleeping 3 1 3   Tired, decreased energy 3 1 3   Change in appetite 2 1 3   Feeling bad or failure about yourself  1 0 0  Trouble concentrating 1 1 3   Moving slowly or fidgety/restless 1 0 3  Suicidal thoughts 0 0 0  PHQ-9 Score 14 6 17   Difficult doing work/chores   Somewhat difficult        09/18/2022    2:52 PM 01/27/2022   11:24 AM  GAD 7 : Generalized Anxiety Score  Nervous, Anxious, on Edge 2 1  Control/stop worrying 2 1  Worry too much - different things 2 1  Trouble relaxing 2 1  Restless 2 1  Easily annoyed or irritable 2 1  Afraid - awful might happen 3 1  Total GAD 7 Score 15 7     Review of Systems:   Pertinent items are noted in HPI Denies abnormal vaginal discharge w/ itching/odor/irritation, headaches, visual changes, shortness of breath, chest pain, abdominal pain, severe nausea/vomiting, or problems with urination or bowel movements unless otherwise stated above. Pertinent History Reviewed:  Reviewed past medical,surgical, social, obstetrical and family history.  Reviewed problem list, medications and allergies. Physical Assessment:   Vitals:   11/27/22 1637  BP: 135/86  Pulse: 94  Weight: 183 lb (83 kg)  Body mass index is 36.96 kg/m.           Physical Examination:    General appearance: alert, well appearing, and in no distress  Mental status: alert, oriented to person, place, and time  Skin: warm & dry   Extremities:      Cardiovascular: normal heart rate noted  Respiratory: normal respiratory effort, no distress  Abdomen: gravid, soft, non-tender  Pelvic: Cervical exam deferred         Fetal Status:     Movement: Present    Fetal Surveillance Testing today: sonogram   Chaperone: N/A    No results found for this or any previous visit (from the past 24 hour(s)).  Assessment & Plan:  High-risk pregnancy: G3P1011 at [redacted]w[redacted]d with an Estimated Date of Delivery: 03/20/23      ICD-10-CM   1. Supervision of high risk pregnancy in second trimester  O09.92     2. Fetal growth restriction antepartum: 7% w/normal UAD([redacted]w[redacted]d)  O36.5990    repeat Sono EFW 4 weeks, then BPP weekly @28 , TWT@32  if remoians <10%    3. Opioid dependence in remission ([place])  F11.21    well maintained on subutex 2 mg    4. History of gestational hypertension  Z87.59    stopped aspirin due to rash        Meds: No orders of the defined types  were placed in this encounter.   Orders: No orders of the defined types were placed in this encounter.    Labs/procedures today: sonogram  Treatment Plan:  as above    Follow-up: Return in about 4 weeks (around 12/25/2022) for sonogram, HROB.   No future appointments.  No orders of the defined types were placed in this encounter.  Lazaro Arms  Attending Physician for the Center for [facility] 11/27/2022 5:05 PM

## 2022-12-27 ENCOUNTER — Other Ambulatory Visit: Payer: Self-pay | Admitting: Obstetrics & Gynecology

## 2022-12-27 DIAGNOSIS — O365990 Maternal care for other known or suspected poor fetal growth, unspecified trimester, not applicable or unspecified: Secondary | ICD-10-CM

## 2022-12-28 ENCOUNTER — Ambulatory Visit (INDEPENDENT_AMBULATORY_CARE_PROVIDER_SITE_OTHER): Payer: Medicaid Other | Admitting: Obstetrics & Gynecology

## 2022-12-28 ENCOUNTER — Encounter: Payer: Self-pay | Admitting: Obstetrics & Gynecology

## 2022-12-28 ENCOUNTER — Ambulatory Visit (INDEPENDENT_AMBULATORY_CARE_PROVIDER_SITE_OTHER): Payer: Medicaid Other

## 2022-12-28 VITALS — BP 136/80 | HR 107 | Wt 182.0 lb

## 2022-12-28 DIAGNOSIS — O0993 Supervision of high risk pregnancy, unspecified, third trimester: Secondary | ICD-10-CM | POA: Diagnosis not present

## 2022-12-28 DIAGNOSIS — Z3A28 28 weeks gestation of pregnancy: Secondary | ICD-10-CM

## 2022-12-28 DIAGNOSIS — O365990 Maternal care for other known or suspected poor fetal growth, unspecified trimester, not applicable or unspecified: Secondary | ICD-10-CM

## 2022-12-28 DIAGNOSIS — Z3480 Encounter for supervision of other normal pregnancy, unspecified trimester: Secondary | ICD-10-CM

## 2022-12-28 DIAGNOSIS — O0992 Supervision of high risk pregnancy, unspecified, second trimester: Secondary | ICD-10-CM

## 2022-12-28 LAB — POCT URINALYSIS DIPSTICK OB
Blood, UA: NEGATIVE
Glucose, UA: NEGATIVE
Ketones, UA: NEGATIVE
Leukocytes, UA: NEGATIVE
Nitrite, UA: NEGATIVE
POC,PROTEIN,UA: NEGATIVE

## 2022-12-28 NOTE — Progress Notes (Signed)
HIGH-RISK PREGNANCY VISIT Patient name: Andrea [name] MRN [id number]  Date of birth: 03-31-1996 Chief Complaint:   Routine Prenatal Visit  History of Present Illness:   Andrea [name] is a 27 y.o. G8P1011 female at [redacted]w[redacted]d with an Estimated Date of Delivery: 03/20/23 being seen today for ongoing management of a high-risk pregnancy complicated by FGR 5% normal UAD.    Today she reports no complaints. Contractions: Not present. Vag. Bleeding: None.  Movement: Present. denies leaking of fluid.      09/18/2022    2:52 PM 01/27/2022   11:24 AM 10/28/2018    3:38 PM  Depression screen PHQ 2/9  Decreased Interest 2 1 2   Down, Depressed, Hopeless 1 1 0  PHQ - 2 Score 3 2 2   Altered sleeping 3 1 3   Tired, decreased energy 3 1 3   Change in appetite 2 1 3   Feeling bad or failure about yourself  1 0 0  Trouble concentrating 1 1 3   Moving slowly or fidgety/restless 1 0 3  Suicidal thoughts 0 0 0  PHQ-9 Score 14 6 17   Difficult doing work/chores   Somewhat difficult        09/18/2022    2:52 PM 01/27/2022   11:24 AM  GAD 7 : Generalized Anxiety Score  Nervous, Anxious, on Edge 2 1  Control/stop worrying 2 1  Worry too much - different things 2 1  Trouble relaxing 2 1  Restless 2 1  Easily annoyed or irritable 2 1  Afraid - awful might happen 3 1  Total GAD 7 Score 15 7     Review of Systems:   Pertinent items are noted in HPI Denies abnormal vaginal discharge w/ itching/odor/irritation, headaches, visual changes, shortness of breath, chest pain, abdominal pain, severe nausea/vomiting, or problems with urination or bowel movements unless otherwise stated above. Pertinent History Reviewed:  Reviewed past medical,surgical, social, obstetrical and family history.  Reviewed problem list, medications and allergies. Physical Assessment:   Vitals:   12/28/22 1439 12/28/22 1450  BP: (!) 141/90 136/80  Pulse: (!) 108 (!) 107  Weight: 182 lb (82.6 kg)   Body mass index is 36.76  kg/m.           Physical Examination:   General appearance: alert, well appearing, and in no distress  Mental status: alert, oriented to person, place, and time  Skin: warm & dry   Extremities:      Cardiovascular: normal heart rate noted  Respiratory: normal respiratory effort, no distress  Abdomen: gravid, soft, non-tender  Pelvic: Cervical exam deferred         Fetal Status:     Movement: Present    Fetal Surveillance Testing today: BPP 8/8   Chaperone: N/A    Results for orders placed or performed in visit on 12/28/22 (from the past 24 hour(s))  POC Urinalysis Dipstick OB   Collection Time: 12/28/22  2:52 PM  Result Value Ref Range   Color, UA     Clarity, UA     Glucose, UA Negative Negative   Bilirubin, UA     Ketones, UA neg    Spec Grav, UA     Blood, UA neg    pH, UA     POC,PROTEIN,UA Negative Negative, Trace, Small (1+), Moderate (2+), Large (3+), 4+   Urobilinogen, UA     Nitrite, UA neg    Leukocytes, UA Negative Negative   Appearance     Odor  Assessment & Plan:  High-risk pregnancy: G3P1011 at [redacted]w[redacted]d with an Estimated Date of Delivery: 03/20/23      ICD-10-CM   1. Supervision of high risk pregnancy in second trimester  O09.92     2. Fetal growth restriction antepartum: 7% w/normal UAD([redacted]w[redacted]d)  O36.5990     3. [redacted] weeks gestation of pregnancy  Z3A.28 POC Urinalysis Dipstick OB        Meds: No orders of the defined types were placed in this encounter.   Orders:  Orders Placed This Encounter  Procedures   POC Urinalysis Dipstick OB     Labs/procedures today: U/S  Treatment Plan:  Begin twice weekly testing 32 weeks   Reviewed: Preterm labor symptoms and general obstetric precautions including but not limited to vaginal bleeding, contractions, leaking of fluid and fetal movement were reviewed in detail with the patient.  All questions were answered. Does have home bp cuff. Office bp cuff given: not applicable. Check bp weekly, let us know if  consistently >140 and/or >90.  Follow-up: Return in about 3 weeks (around 01/18/2023) for sonogram BPP, HROB.   Future Appointments  Date Time Provider [place]  12/28/2022  3:50 PM Martese Vanatta, Mertie Clause, MD [facility]    Orders Placed This Encounter  Procedures   POC Urinalysis Dipstick OB   Florian Buff  Attending Physician for the Center for [facility] 12/28/2022 3:07 PM

## 2022-12-28 NOTE — Progress Notes (Signed)
[place] 24+2 wks,cephalic,BPP 3/5,TIR 44.3 cm,FHR 148 bpm,RI .72,.67,.65,.64=49%,EFW 990 g 5% AC 12%

## 2023-01-17 ENCOUNTER — Other Ambulatory Visit: Payer: Self-pay | Admitting: Obstetrics & Gynecology

## 2023-01-17 DIAGNOSIS — O365920 Maternal care for other known or suspected poor fetal growth, second trimester, not applicable or unspecified: Secondary | ICD-10-CM

## 2023-01-18 ENCOUNTER — Encounter: Payer: Self-pay | Admitting: Obstetrics and Gynecology

## 2023-01-18 ENCOUNTER — Ambulatory Visit (INDEPENDENT_AMBULATORY_CARE_PROVIDER_SITE_OTHER): Payer: Medicaid Other

## 2023-01-18 ENCOUNTER — Ambulatory Visit (INDEPENDENT_AMBULATORY_CARE_PROVIDER_SITE_OTHER): Payer: Medicaid Other | Admitting: Obstetrics and Gynecology

## 2023-01-18 VITALS — BP 148/90 | HR 111 | Wt 180.4 lb

## 2023-01-18 DIAGNOSIS — Z23 Encounter for immunization: Secondary | ICD-10-CM | POA: Diagnosis not present

## 2023-01-18 DIAGNOSIS — Z3480 Encounter for supervision of other normal pregnancy, unspecified trimester: Secondary | ICD-10-CM

## 2023-01-18 DIAGNOSIS — R030 Elevated blood-pressure reading, without diagnosis of hypertension: Secondary | ICD-10-CM | POA: Diagnosis not present

## 2023-01-18 DIAGNOSIS — O365920 Maternal care for other known or suspected poor fetal growth, second trimester, not applicable or unspecified: Secondary | ICD-10-CM

## 2023-01-18 DIAGNOSIS — Z3A31 31 weeks gestation of pregnancy: Secondary | ICD-10-CM

## 2023-01-18 DIAGNOSIS — O365930 Maternal care for other known or suspected poor fetal growth, third trimester, not applicable or unspecified: Secondary | ICD-10-CM | POA: Diagnosis not present

## 2023-01-18 DIAGNOSIS — O021 Missed abortion: Secondary | ICD-10-CM

## 2023-01-18 DIAGNOSIS — O365990 Maternal care for other known or suspected poor fetal growth, unspecified trimester, not applicable or unspecified: Secondary | ICD-10-CM | POA: Insufficient documentation

## 2023-01-18 DIAGNOSIS — Z8759 Personal history of other complications of pregnancy, childbirth and the puerperium: Secondary | ICD-10-CM

## 2023-01-18 DIAGNOSIS — Z87898 Personal history of other specified conditions: Secondary | ICD-10-CM | POA: Insufficient documentation

## 2023-01-18 DIAGNOSIS — O0992 Supervision of high risk pregnancy, unspecified, second trimester: Secondary | ICD-10-CM

## 2023-01-18 DIAGNOSIS — O0993 Supervision of high risk pregnancy, unspecified, third trimester: Secondary | ICD-10-CM

## 2023-01-18 DIAGNOSIS — O99323 Drug use complicating pregnancy, third trimester: Secondary | ICD-10-CM

## 2023-01-18 DIAGNOSIS — F1120 Opioid dependence, uncomplicated: Secondary | ICD-10-CM

## 2023-01-18 LAB — POCT URINALYSIS DIPSTICK OB
Ketones, UA: NEGATIVE
Leukocytes, UA: NEGATIVE
Nitrite, UA: NEGATIVE
POC,PROTEIN,UA: NEGATIVE

## 2023-01-18 NOTE — Progress Notes (Signed)
Subjective:  Andrea [name] is a 27 y.o. G3P1011 at [redacted]w[redacted]d being seen today for ongoing prenatal care.  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  Patient reports  general discomforts of pregnancy .  Contractions: Irritability. Vag. Bleeding: None.  Movement: Present. Denies leaking of fluid.   The following portions of the patient's history were reviewed and updated as appropriate: allergies, current medications, past family history, past medical history, past social history, past surgical history and problem list. Problem list updated.  Objective:   Vitals:   01/18/23 0913 01/18/23 0918  BP: (!) 140/83 (!) 148/90  Pulse: (!) 111   Weight: 180 lb 6.4 oz (81.8 kg)     Fetal Status:     Movement: Present     General:  Alert, oriented and cooperative. Patient is in no acute distress.  Skin: Skin is warm and dry. No rash noted.   Cardiovascular: Normal heart rate noted  Respiratory: Normal respiratory effort, no problems with respiration noted  Abdomen: Soft, gravid, appropriate for gestational age. Pain/Pressure: Absent     Pelvic:  Cervical exam deferred        Extremities: Normal range of motion.  Edema: Trace  Mental Status: Normal mood and affect. Normal behavior. Normal judgment and thought content.   Urinalysis:      Assessment and Plan:  Pregnancy: G3P1011 at [redacted]w[redacted]d  1. Supervision of other normal pregnancy, antepartum Stable Tdap today Glucola next week - Comprehensive metabolic  panel - Protein / creatinine ratio, urine - CBC - POC Urinalysis Dipstick OB  2. Elevated blood-pressure reading without diagnosis of hypertension  - Comprehensive metabolic panel - Protein / creatinine ratio, urine - CBC - POC Urinalysis Dipstick OB  3. [redacted] weeks gestation of pregnancy  - Comprehensive metabolic panel - Protein / creatinine ratio, urine - CBC - POC Urinalysis Dipstick OB  4. Supervision of high risk pregnancy in second trimester See above  5. Pregnancy complicated by Suboxone maintenance, antepartum (HCC) Stable Continue with Suboxone  6. IUFD at less than 20 weeks of gestation Continue with serial growth scans and weekly antenatal testing  7. History of gestational hypertension BP elevated today. Denies HA or visual changes Will check labs as noted above  8. Poor fetal growth affecting management of mother in third trimester, single or unspecified fetus Growth, BPP and Doppler studies today Continue with serial growth scans and antenatal testing  Preterm labor symptoms and general obstetric precautions including but not limited to vaginal bleeding, contractions, leaking of fluid and fetal movement were reviewed in detail with the patient. Please refer to After Visit Summary for other counseling recommendations.  Return in about 2 weeks (around 02/01/2023) for OB visit, face to face, MD only.   Chancy Milroy, MD

## 2023-01-18 NOTE — Progress Notes (Signed)
[place] 67+6 wks,cephalic,BPP 7/2,CNOBSJGGE placenta gr 0,FHR 140 bpm,AFI 16 cm,EFW 1495 g 9.6%,AC 44%,FL .2%,BPD 3.4%,RI .72,.68,.70=83%

## 2023-01-20 LAB — COMPREHENSIVE METABOLIC PANEL
ALT: 14 IU/L (ref 0–32)
AST: 20 IU/L (ref 0–40)
Albumin/Globulin Ratio: 1.5 (ref 1.2–2.2)
Albumin: 3.7 g/dL — ABNORMAL LOW (ref 4.0–5.0)
Alkaline Phosphatase: 144 IU/L — ABNORMAL HIGH (ref 44–121)
BUN/Creatinine Ratio: 10 (ref 9–23)
BUN: 5 mg/dL — ABNORMAL LOW (ref 6–20)
Bilirubin Total: 0.2 mg/dL (ref 0.0–1.2)
CO2: 21 mmol/L (ref 20–29)
Calcium: 8.9 mg/dL (ref 8.7–10.2)
Chloride: 103 mmol/L (ref 96–106)
Creatinine, Ser: 0.52 mg/dL — ABNORMAL LOW (ref 0.57–1.00)
Globulin, Total: 2.5 g/dL (ref 1.5–4.5)
Glucose: 101 mg/dL — ABNORMAL HIGH (ref 70–99)
Potassium: 3.9 mmol/L (ref 3.5–5.2)
Sodium: 137 mmol/L (ref 134–144)
Total Protein: 6.2 g/dL (ref 6.0–8.5)
eGFR: 131 mL/min/{1.73_m2} (ref >59)

## 2023-01-20 LAB — CBC
Hematocrit: 33.4 % — ABNORMAL LOW (ref 34.0–46.6)
Hemoglobin: 11.1 g/dL (ref 11.1–15.9)
MCH: 27.2 pg (ref 26.6–33.0)
MCHC: 33.2 g/dL (ref 31.5–35.7)
MCV: 82 fL (ref 79–97)
Platelets: 253 10*3/uL (ref 150–450)
RBC: 4.08 x10E6/uL (ref 3.77–5.28)
RDW: 13 % (ref 11.7–15.4)
WBC: 11.5 10*3/uL — ABNORMAL HIGH (ref 3.4–10.8)

## 2023-01-20 LAB — PROTEIN / CREATININE RATIO, URINE
Creatinine, Urine: 60 mg/dL
Protein, Ur: 40.2 mg/dL
Protein/Creat Ratio: 670 mg/g creat — ABNORMAL HIGH (ref 0–200)

## 2023-01-22 ENCOUNTER — Ambulatory Visit (INDEPENDENT_AMBULATORY_CARE_PROVIDER_SITE_OTHER): Payer: Medicaid Other | Admitting: *Deleted

## 2023-01-22 VITALS — BP 120/76 | HR 112

## 2023-01-22 DIAGNOSIS — Z3A31 31 weeks gestation of pregnancy: Secondary | ICD-10-CM

## 2023-01-22 DIAGNOSIS — O365990 Maternal care for other known or suspected poor fetal growth, unspecified trimester, not applicable or unspecified: Secondary | ICD-10-CM | POA: Diagnosis not present

## 2023-01-22 NOTE — Progress Notes (Signed)
   NURSE VISIT- NST  SUBJECTIVE:  Andrea [name] is a 27 y.o. G37P1011 female at [redacted]w[redacted]d, here for a NST for pregnancy complicated by FGR.  She reports active fetal movement, contractions: occasional, vaginal bleeding: none, membranes: intact.   OBJECTIVE:  BP 120/76   Pulse (!) 112   LMP 06/24/2022 (Approximate)   Appears well, no apparent distress  No results found for this or any previous visit (from the past 24 hour(s)).  NST: FHR baseline 135 bpm, Variability: moderate, Accelerations:present, Decelerations:  Absent= Cat 1/reactive Toco: none   ASSESSMENT: G3P1011 at [redacted]w[redacted]d with FGR NST reactive  PLAN: EFM strip reviewed by Dr. Nelda Marseille   Recommendations: keep next appointment as scheduled    Alice Rieger  01/22/2023 3:43 PM

## 2023-01-25 ENCOUNTER — Ambulatory Visit (INDEPENDENT_AMBULATORY_CARE_PROVIDER_SITE_OTHER): Payer: Medicaid Other | Admitting: Obstetrics and Gynecology

## 2023-01-25 ENCOUNTER — Encounter: Payer: Self-pay | Admitting: Obstetrics and Gynecology

## 2023-01-25 ENCOUNTER — Other Ambulatory Visit: Payer: Medicaid Other

## 2023-01-25 VITALS — BP 133/89 | HR 109 | Wt 183.0 lb

## 2023-01-25 DIAGNOSIS — O99323 Drug use complicating pregnancy, third trimester: Secondary | ICD-10-CM

## 2023-01-25 DIAGNOSIS — Z1389 Encounter for screening for other disorder: Secondary | ICD-10-CM

## 2023-01-25 DIAGNOSIS — Z3480 Encounter for supervision of other normal pregnancy, unspecified trimester: Secondary | ICD-10-CM | POA: Diagnosis not present

## 2023-01-25 DIAGNOSIS — Z8759 Personal history of other complications of pregnancy, childbirth and the puerperium: Secondary | ICD-10-CM | POA: Insufficient documentation

## 2023-01-25 DIAGNOSIS — O365930 Maternal care for other known or suspected poor fetal growth, third trimester, not applicable or unspecified: Secondary | ICD-10-CM

## 2023-01-25 DIAGNOSIS — O0993 Supervision of high risk pregnancy, unspecified, third trimester: Secondary | ICD-10-CM | POA: Diagnosis not present

## 2023-01-25 DIAGNOSIS — Z3A32 32 weeks gestation of pregnancy: Secondary | ICD-10-CM | POA: Diagnosis not present

## 2023-01-25 DIAGNOSIS — O0992 Supervision of high risk pregnancy, unspecified, second trimester: Secondary | ICD-10-CM

## 2023-01-25 DIAGNOSIS — O1403 Mild to moderate pre-eclampsia, third trimester: Secondary | ICD-10-CM

## 2023-01-25 DIAGNOSIS — O1400 Mild to moderate pre-eclampsia, unspecified trimester: Secondary | ICD-10-CM | POA: Insufficient documentation

## 2023-01-25 DIAGNOSIS — Z331 Pregnant state, incidental: Secondary | ICD-10-CM

## 2023-01-25 DIAGNOSIS — F1120 Opioid dependence, uncomplicated: Secondary | ICD-10-CM

## 2023-01-25 LAB — POCT URINALYSIS DIPSTICK OB
Glucose, UA: NEGATIVE
Ketones, UA: NEGATIVE
Leukocytes, UA: NEGATIVE
Nitrite, UA: NEGATIVE
POC,PROTEIN,UA: NEGATIVE

## 2023-01-25 LAB — OB RESULTS CONSOLE HIV ANTIBODY (ROUTINE TESTING): HIV: NONREACTIVE

## 2023-01-25 NOTE — Addendum Note (Signed)
Addended by: Diona Fanti A on: 01/25/2023 10:18 AM   Modules accepted: Orders

## 2023-01-25 NOTE — Progress Notes (Signed)
Subjective:  Andrea [name] is a 27 y.o. G3P1011 at 78w2dbeing seen today for ongoing prenatal care.  She is currently monitored for the following issues for this high-risk pregnancy and has Depression with anxiety; H/O opiate addiction; History of gestational hypertension; IUFD at less than 20 weeks of gestation; Encounter for supervision of normal pregnancy, antepartum; [place] by [place], [place]; Supervision of high risk pregnancy in second trimester; Fetal growth restriction antepartum: 7% w/normal UAD(243w6d IUGR (intrauterine growth restriction) affecting care of mother; and Mild preeclampsia on their problem list.  Patient reports  general discomforts of pregnancy . Denies HA or visual changes. Contractions: Irritability.  .  Movement: Present. Denies leaking of fluid.   The following portions of the patient's history were reviewed and updated as appropriate: allergies, current medications, past family history, past medical history, past social history, past surgical history and problem list. Problem list updated.  Objective:   Vitals:   01/25/23 0845  BP: 133/89  Pulse: (!) 109  Weight: 183 lb (83 kg)    Fetal Status:     Movement: Present     General:  Alert, oriented and cooperative. Patient is in no acute distress.  Skin: Skin is warm and dry. No rash noted.   Cardiovascular: Normal heart rate noted  Respiratory: Normal respiratory effort, no problems with respiration noted  Abdomen: Soft, gravid, appropriate for gestational age. Pain/Pressure: Present     Pelvic:  Cervical exam deferred        Extremities: Normal range of motion.  Edema: Trace  Mental Status: Normal mood and affect. Normal behavior. Normal judgment and thought content.   Urinalysis:      Assessment and Plan:  Pregnancy: G3P1011 at 3248w2d. Supervision of high risk pregnancy in second trimester Stable Glucola today  2. Pregnancy complicated by Suboxone  maintenance, antepartum ([facility]CC) Stable Continue with Suboxone  3. Poor fetal growth affecting management of mother in third trimester, single or unspecified fetus NST today Baseline 130's, + acels, no decels, no ut ctx, reactive Continue with serial growth scans and antenatal testing as per protocol  4. Mild pre-eclampsia in third trimester Reviewed with pt Consider repeat labs with next visit. U/S and antenatal testing as noted above IOL at 37 428 44eks or for maternal/fetal indications   Preterm labor symptoms and general obstetric precautions including but not limited to vaginal bleeding, contractions, leaking of fluid and fetal movement were reviewed in detail with the patient. Please refer to After Visit Summary for other counseling recommendations.  Return in about 2 weeks (around 02/08/2023) for OB visit, face to face, MD only.   ErvChancy MilroyD

## 2023-01-25 NOTE — Addendum Note (Signed)
Addended by: Diona Fanti A on: 01/25/2023 10:28 AM   Modules accepted: Orders

## 2023-01-26 ENCOUNTER — Other Ambulatory Visit: Payer: Self-pay | Admitting: *Deleted

## 2023-01-26 ENCOUNTER — Encounter: Payer: Self-pay | Admitting: Obstetrics & Gynecology

## 2023-01-26 ENCOUNTER — Encounter: Payer: Self-pay | Admitting: *Deleted

## 2023-01-26 DIAGNOSIS — O24410 Gestational diabetes mellitus in pregnancy, diet controlled: Secondary | ICD-10-CM

## 2023-01-26 DIAGNOSIS — Z8632 Personal history of gestational diabetes: Secondary | ICD-10-CM | POA: Insufficient documentation

## 2023-01-26 DIAGNOSIS — O24419 Gestational diabetes mellitus in pregnancy, unspecified control: Secondary | ICD-10-CM | POA: Insufficient documentation

## 2023-01-26 LAB — CBC
Hematocrit: 34.7 % (ref 34.0–46.6)
Hemoglobin: 11.3 g/dL (ref 11.1–15.9)
MCH: 27.2 pg (ref 26.6–33.0)
MCHC: 32.6 g/dL (ref 31.5–35.7)
MCV: 84 fL (ref 79–97)
Platelets: 261 10*3/uL (ref 150–450)
RBC: 4.15 x10E6/uL (ref 3.77–5.28)
RDW: 13.1 % (ref 11.7–15.4)
WBC: 10 10*3/uL (ref 3.4–10.8)

## 2023-01-26 LAB — ANTIBODY SCREEN: Antibody Screen: NEGATIVE

## 2023-01-26 LAB — GLUCOSE TOLERANCE, 2 HOURS W/ 1HR
Glucose, 1 hour: 186 mg/dL — ABNORMAL HIGH (ref 70–179)
Glucose, 2 hour: 152 mg/dL (ref 70–152)
Glucose, Fasting: 97 mg/dL — ABNORMAL HIGH (ref 70–91)

## 2023-01-26 LAB — HIV ANTIBODY (ROUTINE TESTING W REFLEX): HIV Screen 4th Generation wRfx: NONREACTIVE

## 2023-01-26 LAB — RPR: RPR Ser Ql: NONREACTIVE

## 2023-01-26 MED ORDER — ACCU-CHEK SOFTCLIX LANCETS MISC: 12 refills | Status: DC

## 2023-01-26 MED ORDER — ACCU-CHEK GUIDE VI STRP: ORAL_STRIP | 12 refills | Status: DC

## 2023-01-26 MED ORDER — ACCU-CHEK GUIDE ME W/DEVICE KIT: 1.0000 | PACK | Freq: Four times a day (QID) | 0 refills | Status: DC

## 2023-01-29 ENCOUNTER — Ambulatory Visit (INDEPENDENT_AMBULATORY_CARE_PROVIDER_SITE_OTHER): Payer: Medicaid Other | Admitting: *Deleted

## 2023-01-29 VITALS — BP 132/80 | HR 75

## 2023-01-29 DIAGNOSIS — O24410 Gestational diabetes mellitus in pregnancy, diet controlled: Secondary | ICD-10-CM

## 2023-01-29 DIAGNOSIS — Z3480 Encounter for supervision of other normal pregnancy, unspecified trimester: Secondary | ICD-10-CM

## 2023-01-29 DIAGNOSIS — Z3A32 32 weeks gestation of pregnancy: Secondary | ICD-10-CM | POA: Diagnosis not present

## 2023-01-29 LAB — POCT URINALYSIS DIPSTICK OB
Blood, UA: NEGATIVE
Glucose, UA: NEGATIVE
Ketones, UA: NEGATIVE
Nitrite, UA: NEGATIVE

## 2023-01-29 NOTE — Progress Notes (Addendum)
   NURSE VISIT- NST  SUBJECTIVE:  Andrea [name] is a 27 y.o. G53P1011 female at [redacted]w[redacted]d here for a NST for pregnancy complicated by FGR.  She reports active fetal movement, contractions: none, vaginal bleeding: none, membranes: intact.   OBJECTIVE:  BP 132/80   Pulse 75   LMP 06/24/2022 (Approximate)   Appears well, no apparent distress  Results for orders placed or performed in visit on 01/29/23 (from the past 24 hour(s))  POC Urinalysis Dipstick OB   Collection Time: 01/29/23  3:39 PM  Result Value Ref Range   Color, UA     Clarity, UA     Glucose, UA Negative Negative   Bilirubin, UA     Ketones, UA neg    Spec Grav, UA     Blood, UA neg    pH, UA     POC,PROTEIN,UA Small (1+) Negative, Trace, Small (1+), Moderate (2+), Large (3+), 4+   Urobilinogen, UA     Nitrite, UA neg    Leukocytes, UA Trace (A) Negative   Appearance     Odor      NST: FHR baseline 130 bpm, Variability: moderate, Accelerations:present, Decelerations:  Absent= Cat 1/reactive Toco: none   ASSESSMENT: G3P1011 at 355w6dith FGR NST reactive  PLAN: EFM strip reviewed by FrNigel BertholdCNM   Recommendations: keep next appointment as scheduled    AmJanece Canterbury2/19/2024 4:17 PM   Chart reviewed for nurse visit. Agree with plan of care.  CrChristin FudgeCNNorth Dakota/19/2024 10:42 PM

## 2023-01-30 ENCOUNTER — Other Ambulatory Visit (INDEPENDENT_AMBULATORY_CARE_PROVIDER_SITE_OTHER): Payer: Medicaid Other

## 2023-01-30 ENCOUNTER — Telehealth: Payer: Self-pay | Admitting: Obstetrics & Gynecology

## 2023-01-30 VITALS — BP 130/88 | HR 113

## 2023-01-30 DIAGNOSIS — O0992 Supervision of high risk pregnancy, unspecified, second trimester: Secondary | ICD-10-CM

## 2023-01-30 DIAGNOSIS — R1030 Lower abdominal pain, unspecified: Secondary | ICD-10-CM

## 2023-01-30 DIAGNOSIS — Z3A33 33 weeks gestation of pregnancy: Secondary | ICD-10-CM

## 2023-01-30 DIAGNOSIS — R319 Hematuria, unspecified: Secondary | ICD-10-CM | POA: Diagnosis not present

## 2023-01-30 LAB — POCT URINALYSIS DIPSTICK OB
Glucose, UA: NEGATIVE
Ketones, UA: NEGATIVE
Leukocytes, UA: NEGATIVE
Nitrite, UA: NEGATIVE

## 2023-01-30 NOTE — Progress Notes (Addendum)
   NURSE VISIT- UTI SYMPTOMS   SUBJECTIVE:  Andrea [name] is a 27 y.o. G36P1011 female here for UTI symptoms. She is 51w0dpregnant. She reports  light pink blood when wiping after using the bathroom this morning and lower abdominal pressure .  OBJECTIVE:  BP 130/88 (BP Location: Left Arm, Patient Position: Sitting, Cuff Size: Normal)   Pulse (!) 113   LMP 06/24/2022 (Approximate)   Appears well, in no apparent distress  Results for orders placed or performed in visit on 01/30/23 (from the past 24 hour(s))  POC Urinalysis Dipstick OB   Collection Time: 01/30/23  4:03 PM  Result Value Ref Range   Color, UA     Clarity, UA     Glucose, UA Negative Negative   Bilirubin, UA     Ketones, UA neg    Spec Grav, UA     Blood, UA large    pH, UA     POC,PROTEIN,UA Moderate (2+) Negative, Trace, Small (1+), Moderate (2+), Large (3+), 4+   Urobilinogen, UA     Nitrite, UA neg    Leukocytes, UA Negative Negative   Appearance     Odor      ASSESSMENT: Pregnancy 379w0dith UTI symptoms and negative nitrites  PLAN: Note routed [facility] KiKnute NeuCNM, [facility] provider [facility]: No Urine culture sent Call or return to clinic prn if these symptoms worsen or fail to improve as anticipated. Follow-up: as scheduled   LaAlice Rieger2/20/2024 4:04 PM  Chart reviewed for nurse visit. Per RN, pt reported 1 incident of wiping light pink blood and pelvic pressure this morning, none since. No current sx at all. Normal fetal movement. Dx w/ mild pre-e last week, bp stable, protein 2+ today (but also has large amt blood). Urine cx sent by nurse, f/u 2d for visit as scheduled.  BoRoma SchanzCNNorth [place]/20/2024 4:58 PM

## 2023-01-30 NOTE — Telephone Encounter (Signed)
Patient came into office. Nurse visit completed.

## 2023-01-30 NOTE — Telephone Encounter (Signed)
Patient called stating that when she woke up this morning and went to bathroom, there was some blood that was on tissue. Every time she has went to bathroom since then, there has been some pinkish blood that has came out with her urine. She stated that she is not hurting and is still feeling baby move. Patient is at work and said if she didn't answer when called, she will call back when she gets a moment. Please advise.

## 2023-02-01 ENCOUNTER — Other Ambulatory Visit: Payer: Medicaid Other

## 2023-02-01 ENCOUNTER — Encounter: Payer: Self-pay | Admitting: Obstetrics & Gynecology

## 2023-02-01 ENCOUNTER — Ambulatory Visit (INDEPENDENT_AMBULATORY_CARE_PROVIDER_SITE_OTHER): Payer: Medicaid Other | Admitting: Obstetrics & Gynecology

## 2023-02-01 VITALS — BP 117/81 | HR 112 | Wt 188.0 lb

## 2023-02-01 DIAGNOSIS — O365990 Maternal care for other known or suspected poor fetal growth, unspecified trimester, not applicable or unspecified: Secondary | ICD-10-CM

## 2023-02-01 DIAGNOSIS — O365930 Maternal care for other known or suspected poor fetal growth, third trimester, not applicable or unspecified: Secondary | ICD-10-CM

## 2023-02-01 DIAGNOSIS — Z3A33 33 weeks gestation of pregnancy: Secondary | ICD-10-CM | POA: Diagnosis not present

## 2023-02-01 DIAGNOSIS — Z3480 Encounter for supervision of other normal pregnancy, unspecified trimester: Secondary | ICD-10-CM

## 2023-02-01 DIAGNOSIS — O24410 Gestational diabetes mellitus in pregnancy, diet controlled: Secondary | ICD-10-CM

## 2023-02-01 LAB — URINE CULTURE

## 2023-02-01 NOTE — Progress Notes (Signed)
HIGH-RISK PREGNANCY VISIT Patient name: Andrea [name] MRN [id number]  Date of birth: [birth year] Chief Complaint:   Routine Prenatal Visit and Non-stress Test  History of Present Illness:   Andrea [name] is a 27 y.o. G42P1011 female at 69w2dwith an Estimated Date of Delivery: 03/20/23 being seen today for ongoing management of a high-risk pregnancy complicated by diabetes mellitus A1DM and fetal growth restriction 5%.    Today she reports no complaints. Contractions: Not present. Vag. Bleeding: None.  Movement: Present. denies leaking of fluid.      09/18/2022    2:52 PM 01/27/2022   11:24 AM 10/28/2018    3:38 PM  Depression screen PHQ 2/9  Decreased Interest 2 1 2  $ Down, Depressed, Hopeless 1 1 0  PHQ - 2 Score 3 2 2  $ Altered sleeping 3 1 3  $ Tired, decreased energy 3 1 3  $ Change in appetite 2 1 3  $ Feeling bad or failure about yourself  1 0 0  Trouble concentrating 1 1 3  $ Moving slowly or fidgety/restless 1 0 3  Suicidal thoughts 0 0 0  PHQ-9 Score 14 6 17  $ Difficult doing work/chores   Somewhat difficult        09/18/2022    2:52 PM 01/27/2022   11:24 AM  GAD 7 : Generalized Anxiety Score  Nervous, Anxious, on Edge 2 1  Control/stop worrying 2 1  Worry too much - different things 2 1  Trouble relaxing 2 1  Restless 2 1  Easily annoyed or irritable 2 1  Afraid - awful might happen 3 1  Total GAD 7 Score 15 7     Review of Systems:   Pertinent items are noted in HPI Denies abnormal vaginal discharge w/ itching/odor/irritation, headaches, visual changes, shortness of breath, chest pain, abdominal pain, severe nausea/vomiting, or problems with urination or bowel movements unless otherwise stated above. Pertinent History Reviewed:  Reviewed past medical,surgical, social, obstetrical and family history.  Reviewed problem list, medications and allergies. Physical Assessment:   Vitals:   02/01/23 1527  BP: 117/81  Pulse: (!) 112  Weight: 188 lb (85.3 kg)   Body mass index is 37.97 kg/m.           Physical Examination:   General appearance: alert, well appearing, and in no distress  Mental status: alert, oriented to person, place, and time  Skin: warm & dry   Extremities: Edema: Trace    Cardiovascular: normal heart rate noted  Respiratory: normal respiratory effort, no distress  Abdomen: gravid, soft, non-tender  Pelvic: Cervical exam deferred         Fetal Status:     Movement: Present    Fetal Surveillance Testing today: Reactive NST  Nefertari MMUKTA SVATOSis at 330w2dstimated Date of Delivery: 03/20/23  NST being performed due to FGSouthbridgeToday the NST is Reactive  Fetal Monitoring:  Baseline: 140 bpm, Variability: Good {> 6 bpm), Accelerations: Reactive, and Decelerations: Absent   reactive  The accelerations are >15 bpm and more than 2 in 20 minutes  Final diagnosis:  Reactive NST  LuFlorian BuffMD     Chaperone: N/A    No results found for this or any previous visit (from the past 24 hour(s)).  Assessment & Plan:  High-risk pregnancy: G3P1011 at 3384w2dth an Estimated Date of Delivery: 03/20/23      ICD-10-CM   1. Supervision of other normal pregnancy, antepartum  Z34.80     2.  Fetal growth restriction antepartum  O36.5990     3. Diet controlled gestational diabetes mellitus (GDM) in third trimester  O24.410       Meds: No orders of the defined types were placed in this encounter.   Orders: No orders of the defined types were placed in this encounter.    Labs/procedures today: NST  Treatment Plan:  twice weekly surveillance  Reviewed: Preterm labor symptoms and general obstetric precautions including but not limited to vaginal bleeding, contractions, leaking of fluid and fetal movement were reviewed in detail with the patient.  All questions were answered. Does have home bp cuff. Office bp cuff given: yes. Check bp daily, let us know if consistently >140 and/or >90.  Follow-up: Return for keep  scheduled.   Future Appointments  Date Time Provider [place]  02/05/2023  3:00 PM [facility] - [facility]TOBGYN [place] [facility] None  02/05/2023  3:50 PM Janyth Pupa, DO [facility]-FT FTOBGYN  02/06/2023  2:15 PM [facility]  02/08/2023  3:30 PM [facility]-FTOBGYN NURSE [facility]FT FTOBGYN  02/12/2023  2:15 PM [facility] - FTOBGYN [place] [facility]G None  02/12/2023  3:10 PM Chancy Milroy, MD [facility]-FT FTOBGYN  02/15/2023  3:30 PM [facility]-FTOBGYN NURSE [facility]-FT FTOBGYN  02/19/2023  2:15 PM [place] - FTOBGYN [place] [facility]G None  02/19/2023  3:10 PM Florian Buff, MD [facility]-FT FTOBGYN  02/22/2023  3:30 PM [facility]-FTOBGYN NURSE [facility]-FT FTOBGYN  02/26/2023  3:00 PM [place] - FTOBGYN [place] [facility]-FT[facility]G None  02/26/2023  3:50 PM Christin Fudge, CNM [facility]-FT FTOBGYN  03/01/2023 10:10 AM [facility]-FTOBGYN NURSE [facility]-FT FTOBGYN  03/05/2023  3:10 PM [facility]-FTOBGYN NURSE [facility]-FT FTOBGYN  03/05/2023  3:30 PM Janyth Pupa, DO [facility]-FT FTOBGYN  03/08/2023  3:30 PM [facility]-FTOBGYN NURSE [facility]-FT FTOBGYN  03/12/2023  2:15 PM [place] - FTOBGYN [place] [facility]-FTIMG None  03/12/2023  3:10 PM Florian Buff, MD [facility]-FT FTOBGYN  03/15/2023  3:10 PM [facility]-FTOBGYN NURSE [facility]-FT FTOBGYN  03/19/2023  3:00 PM [place] - FTOBGYN [place] [facility]-FTIMG None  03/19/2023  3:50 PM Yehya Brendle, Mertie Clause, MD [facility]-FT FTOBGYN    No orders of the defined types were placed in this encounter.  Florian Buff  Attending Physician for the [facility] 02/01/2023 4:25 PM

## 2023-02-02 ENCOUNTER — Other Ambulatory Visit: Payer: Self-pay | Admitting: Obstetrics & Gynecology

## 2023-02-02 DIAGNOSIS — O24410 Gestational diabetes mellitus in pregnancy, diet controlled: Secondary | ICD-10-CM

## 2023-02-02 DIAGNOSIS — O365990 Maternal care for other known or suspected poor fetal growth, unspecified trimester, not applicable or unspecified: Secondary | ICD-10-CM

## 2023-02-05 ENCOUNTER — Ambulatory Visit (INDEPENDENT_AMBULATORY_CARE_PROVIDER_SITE_OTHER): Payer: Medicaid Other

## 2023-02-05 ENCOUNTER — Ambulatory Visit (INDEPENDENT_AMBULATORY_CARE_PROVIDER_SITE_OTHER): Payer: Medicaid Other | Admitting: Obstetrics & Gynecology

## 2023-02-05 ENCOUNTER — Encounter: Payer: Self-pay | Admitting: Obstetrics & Gynecology

## 2023-02-05 VITALS — BP 128/82 | HR 114 | Wt 189.0 lb

## 2023-02-05 DIAGNOSIS — O24410 Gestational diabetes mellitus in pregnancy, diet controlled: Secondary | ICD-10-CM

## 2023-02-05 DIAGNOSIS — Z3A33 33 weeks gestation of pregnancy: Secondary | ICD-10-CM

## 2023-02-05 DIAGNOSIS — O365930 Maternal care for other known or suspected poor fetal growth, third trimester, not applicable or unspecified: Secondary | ICD-10-CM

## 2023-02-05 DIAGNOSIS — Z3480 Encounter for supervision of other normal pregnancy, unspecified trimester: Secondary | ICD-10-CM

## 2023-02-05 DIAGNOSIS — O365990 Maternal care for other known or suspected poor fetal growth, unspecified trimester, not applicable or unspecified: Secondary | ICD-10-CM

## 2023-02-05 DIAGNOSIS — O0992 Supervision of high risk pregnancy, unspecified, second trimester: Secondary | ICD-10-CM

## 2023-02-05 DIAGNOSIS — O0993 Supervision of high risk pregnancy, unspecified, third trimester: Secondary | ICD-10-CM

## 2023-02-05 LAB — POCT URINALYSIS DIPSTICK OB
Bilirubin, UA: NEGATIVE
Blood, UA: NEGATIVE
Glucose, UA: NEGATIVE
Ketones, UA: NEGATIVE
Leukocytes, UA: NEGATIVE
Nitrite, UA: NEGATIVE
POC,PROTEIN,UA: NEGATIVE
Spec Grav, UA: 1.02 (ref 1.010–1.025)
Urobilinogen, UA: 0.2 E.U./dL
pH, UA: 6.5 (ref 5.0–8.0)

## 2023-02-05 NOTE — Progress Notes (Signed)
HIGH-RISK PREGNANCY VISIT Patient name: Andrea [name] MRN [id number]  Date of birth: 1996/06/29 Chief Complaint:   Routine Prenatal Visit  History of Present Illness:   Andrea [name] is a 27 y.o. G23P1011 female at 41w6dwith an Estimated Date of Delivery: 03/20/23 being seen today for ongoing management of a high-risk pregnancy complicated by:  -preeclampsia- no severe features BP normal at home, remains asymptomatic  -FGR with UAD -GDMA1 Sugars reviewed, remain stable  Today she reports  recently had a cold, but otherwise doing ok .   Contractions: Irritability. Vag. Bleeding: None.  Movement: Present. denies leaking of fluid.      09/18/2022    2:52 PM 01/27/2022   11:24 AM 10/28/2018    3:38 PM  Depression screen PHQ 2/9  Decreased Interest '2 1 2  '$ Down, Depressed, Hopeless 1 1 0  PHQ - 2 Score '3 2 2  '$ Altered sleeping '3 1 3  '$ Tired, decreased energy '3 1 3  '$ Change in appetite '2 1 3  '$ Feeling bad or failure about yourself  1 0 0  Trouble concentrating '1 1 3  '$ Moving slowly or fidgety/restless 1 0 3  Suicidal thoughts 0 0 0  PHQ-9 Score '14 6 17  '$ Difficult doing work/chores   Somewhat difficult     Current Outpatient Medications  Medication Instructions   Accu-Chek Softclix Lancets lancets Use as instructed to check blood sugar 4 times daily   albuterol (VENTOLIN HFA) 108 (90 Base) MCG/ACT inhaler 2 puffs, Inhalation, Every 6 hours PRN   Blood Glucose Monitoring Suppl (ACCU-CHEK GUIDE ME) w/Device KIT 1 each, Does not apply, 4 times daily   buprenorphine (SUBUTEX) 4 mg, Sublingual, 2 times daily   glucose blood (ACCU-CHEK GUIDE) test strip Use as instructed to check blood sugar 4 times daily   Prenatal Vit-Fe Fumarate-FA (PRENATAL VITAMIN PO) 1 tablet, Oral, Daily     Review of Systems:   Pertinent items are noted in HPI Denies abnormal vaginal discharge w/ itching/odor/irritation, headaches, visual changes, shortness of breath, chest pain, abdominal pain, severe  nausea/vomiting, or problems with urination or bowel movements unless otherwise stated above. Pertinent History Reviewed:  Reviewed past medical,surgical, social, obstetrical and family history.  Reviewed problem list, medications and allergies. Physical Assessment:   Vitals:   02/05/23 1558  BP: 128/82  Pulse: (!) 114  Weight: 189 lb (85.7 kg)  Body mass index is 38.17 kg/m.           Physical Examination:   General appearance: alert, well appearing, and in no distress  Mental status: normal mood, behavior, speech, dress, motor activity, and thought processes  Skin: warm & dry   Extremities: Edema: Trace    Cardiovascular: normal heart rate noted  Respiratory: normal respiratory effort, no distress  Abdomen: gravid, soft, non-tender  Pelvic: Cervical exam deferred         Fetal Status:     Movement: Present    Fetal Surveillance Testing today:   [place]30000000wks,cephalic,BPP 899991111placenta gr 1,FHR 121 bpm,AFI 20 cm,RI .55,.68,.61==61%       Chaperone: N/A    Results for orders placed or performed in visit on 02/05/23 (from the past 24 hour(s))  POC Urinalysis Dipstick OB   Collection Time: 02/05/23  4:09 PM  Result Value Ref Range   Color, UA     Clarity, UA     Glucose, UA Negative Negative   Bilirubin, UA Negative    Ketones, UA Negative  Spec Grav, UA 1.020 1.010 - 1.025   Blood, UA Negative    pH, UA 6.5 5.0 - 8.0   POC,PROTEIN,UA Negative Negative, Trace, Small (1+), Moderate (2+), Large (3+), 4+   Urobilinogen, UA 0.2 0.2 or 1.0 E.U./dL   Nitrite, UA Negative    Leukocytes, UA Negative Negative   Appearance     Odor       Assessment & Plan:  High-risk pregnancy: G3P1011 at 59w6dwith an Estimated Date of Delivery: 03/20/23   -preeclampsia- no severe features UA neg, BP appropriate Reviewed precautions  -FGR with UAD Reviewed today's testing  -GDMA1 Stable with diet only  Continue twice weekly testing Reviewed plan for IOL @ 37wks Labor  and preeclampsia precautions reviewed '[]'$  consider repeat lab work should change in BP, UA or other symptoms  Meds: No orders of the defined types were placed in this encounter.   Labs/procedures today: BPP 8/8, normal dopplers  Treatment Plan:  as outlined above  Reviewed: Preterm labor symptoms and general obstetric precautions including but not limited to vaginal bleeding, contractions, leaking of fluid and fetal movement were reviewed in detail with the patient.  All questions were answered. Pt has home bp cuff. Check bp weekly, let u[place]oreaknow if >140/90.   Follow-up: Return for twice weekly HROB as scheduled.   Future Appointments  Date Time Provider [place] 02/06/2023  [facility] 02/08/2023  3:30 PM [facility]-FTOBGYN NURSE [facility]-FT FTOBGYN  02/12/2023  2:15 PM [facility] - FTOBGYN [place]FTIM[place] None  02/12/2023  3:10 PM EChancy Milroy MD [facility]-FT FTOBGYN  02/15/2023  3:30 PM [facility]-FTOBGYN NURSE [facility]-FT FTOBGYN  02/19/2023  2:15 PM [place] FTO[place]YN [place]FTIM[place] None  02/19/2023  3:10 PM EFlorian Buff MD C[facility]-FT FTOBGYN  02/22/2023  3:30 PM C[facility]-FTOBGYN NURSE C[facility]-FT FTOBGYN  02/26/2023  3:00 PM [place] FTO[facility]YN [place]FTIMG None  02/26/2023  3:50 PM CChristin Fudge CNM C[facility]-FT FTOBGYN  03/01/2023 10:10 AM [facility]-FTOBGYN NURSE [facility]-FT FTOBGYN  03/05/2023  3:10 PM [facility]-FTOBGYN NURSE C[facility]-FT FTOBGYN  03/05/2023  3:30 PM OJanyth Pupa DO [facility]-FT FTOBGYN  03/08/2023  3:30 PM [facility]-FTOBGYN NURSE C[facility]-FT FTOBGYN  03/12/2023  2:15 PM [place] FTO[place]FTIM[place] None  03/12/2023  3:10 PM EFlorian Buff MD C[facility]-FT FTOBGYN  03/15/2023  3:10 PM C[facility]-FTOBGYN NURSE C[facility]-FT FTOBGYN  03/19/2023  3:00 PM [place]- FTOBGYN [place]FTIMG None  03/19/2023  3:50 PM E[place], LMertie Clause MD C[facility]-FT FTOBGYN    Orders Placed This Encounter  Procedures   POC Urinalysis Dipstick OB    JJanyth Pupa DO Attending OTanacross FCuyahoga Heightsfor WDean Foods Company CMon[place]

## 2023-02-05 NOTE — Progress Notes (Signed)
[place] 0000000 wks,cephalic,BPP [id number] placenta gr 1,FHR 121 bpm,AFI 20 cm,RI .55,.68,.61==61%

## 2023-02-06 ENCOUNTER — Other Ambulatory Visit: Payer: Medicaid Other

## 2023-02-08 ENCOUNTER — Other Ambulatory Visit: Payer: Self-pay | Admitting: Obstetrics & Gynecology

## 2023-02-08 ENCOUNTER — Ambulatory Visit (INDEPENDENT_AMBULATORY_CARE_PROVIDER_SITE_OTHER): Payer: Medicaid Other | Admitting: *Deleted

## 2023-02-08 VITALS — BP 133/84 | HR 117 | Wt 189.0 lb

## 2023-02-08 DIAGNOSIS — O365930 Maternal care for other known or suspected poor fetal growth, third trimester, not applicable or unspecified: Secondary | ICD-10-CM

## 2023-02-08 DIAGNOSIS — O24410 Gestational diabetes mellitus in pregnancy, diet controlled: Secondary | ICD-10-CM

## 2023-02-08 DIAGNOSIS — Z3480 Encounter for supervision of other normal pregnancy, unspecified trimester: Secondary | ICD-10-CM

## 2023-02-08 DIAGNOSIS — O0992 Supervision of high risk pregnancy, unspecified, second trimester: Secondary | ICD-10-CM

## 2023-02-08 DIAGNOSIS — Z3A34 34 weeks gestation of pregnancy: Secondary | ICD-10-CM | POA: Diagnosis not present

## 2023-02-08 DIAGNOSIS — O0993 Supervision of high risk pregnancy, unspecified, third trimester: Secondary | ICD-10-CM

## 2023-02-08 NOTE — Progress Notes (Signed)
   NURSE VISIT- NST  SUBJECTIVE:  Andrea [name] is a 27 y.o. G8P1011 female at [redacted]w[redacted]d here for a NST for pregnancy complicated by Diabetes: A1DM} and FGR.  She reports active fetal movement, contractions: none, vaginal bleeding: none, membranes: intact.   OBJECTIVE:  BP 133/84   Pulse (!) 117   Wt 189 lb (85.7 kg)   LMP 06/24/2022 (Approximate)   BMI 38.17 kg/m   Appears well, no apparent distress  No results found for this or any previous visit (from the past 24 hour(s)).  NST: FHR baseline 135 bpm, Variability: moderate, Accelerations:present, Decelerations:  Absent= Cat 1/reactive Toco: none   ASSESSMENT: G3P1011 at 340w2dith Diabetes: A1DM} and FGR NST reactive  PLAN: EFM strip reviewed by Dr. OzNelda Marseille Recommendations: keep next appointment as scheduled    LaAlice Rieger2/29/2024 4:02 PM

## 2023-02-12 ENCOUNTER — Ambulatory Visit (INDEPENDENT_AMBULATORY_CARE_PROVIDER_SITE_OTHER): Payer: Medicaid Other | Admitting: Obstetrics and Gynecology

## 2023-02-12 ENCOUNTER — Ambulatory Visit (INDEPENDENT_AMBULATORY_CARE_PROVIDER_SITE_OTHER): Payer: Medicaid Other

## 2023-02-12 ENCOUNTER — Encounter: Payer: Self-pay | Admitting: Obstetrics and Gynecology

## 2023-02-12 VITALS — BP 133/86 | HR 99 | Wt 188.4 lb

## 2023-02-12 DIAGNOSIS — O99323 Drug use complicating pregnancy, third trimester: Secondary | ICD-10-CM

## 2023-02-12 DIAGNOSIS — Z3A34 34 weeks gestation of pregnancy: Secondary | ICD-10-CM

## 2023-02-12 DIAGNOSIS — O0993 Supervision of high risk pregnancy, unspecified, third trimester: Secondary | ICD-10-CM

## 2023-02-12 DIAGNOSIS — F1120 Opioid dependence, uncomplicated: Secondary | ICD-10-CM

## 2023-02-12 DIAGNOSIS — O1403 Mild to moderate pre-eclampsia, third trimester: Secondary | ICD-10-CM

## 2023-02-12 DIAGNOSIS — O24410 Gestational diabetes mellitus in pregnancy, diet controlled: Secondary | ICD-10-CM

## 2023-02-12 DIAGNOSIS — O365930 Maternal care for other known or suspected poor fetal growth, third trimester, not applicable or unspecified: Secondary | ICD-10-CM

## 2023-02-12 DIAGNOSIS — O0992 Supervision of high risk pregnancy, unspecified, second trimester: Secondary | ICD-10-CM

## 2023-02-12 DIAGNOSIS — Z3480 Encounter for supervision of other normal pregnancy, unspecified trimester: Secondary | ICD-10-CM

## 2023-02-12 NOTE — Progress Notes (Signed)
[place] [id number] wks,cephalic,posterior placenta gr 1,FHR 135 bpm,RI .79,.67,.66,.65=82%,AFI 19 cm,EFW 2168 g 12%,AC 36%,FL .8%,BPD 2.7%,HC 8%,BPP 8/8

## 2023-02-12 NOTE — Progress Notes (Signed)
Subjective:  Andrea [name] is a 27 y.o. G3P1011 at 25w6dbeing seen today for ongoing prenatal care.  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  Patient reports  general discomforts of pregnancy .  Contractions: Not present. Vag. Bleeding: None.  Movement: Present. Denies leaking of fluid.   The following portions of the patient's history were reviewed and updated as appropriate: allergies, current medications, past family history, past medical history, past social history, past surgical history and problem list. Problem list updated.  Objective:   Vitals:   02/12/23 1500  BP: 133/86  Pulse: 99  Weight: 188 lb 6.4 oz (85.5 kg)    Fetal Status:     Movement: Present     General:  Alert, oriented and cooperative. Patient is in no acute distress.  Skin: Skin is warm and dry. No rash noted.   Cardiovascular: Normal heart rate noted  Respiratory: Normal respiratory effort, no problems with respiration noted  Abdomen: Soft, gravid, appropriate for gestational age. Pain/Pressure: Present     Pelvic:  Cervical exam deferred        Extremities: Normal range of motion.     Mental Status: Normal mood and affect. Normal behavior. Normal judgment and thought content.   Urinalysis:      Assessment and Plan:  Pregnancy: G3P1011 at 3437w6d. Supervision of high risk pregnancy in second trimester Stable GBS next visit  2. Poor fetal growth affecting  management of mother in third trimester, single or unspecified fetus Continue with antenatal testing  3. Diet controlled gestational diabetes mellitus (GDM) in third trimester CBG in goal range  4. Mild pre-eclampsia in third trimester No S/Sx of SPEC Labs today IOL at 37 weeks Schedule at next week visit - Protein / creatinine ratio, urine - Comp Met (CMET) - CBC  5. 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 Please refer to After Visit Summary for other counseling recommendations.  No follow-ups on file.   ErvChancy MilroyD

## 2023-02-13 LAB — COMPREHENSIVE METABOLIC PANEL
ALT: 31 IU/L (ref 0–32)
AST: 27 IU/L (ref 0–40)
Albumin/Globulin Ratio: 1.4 (ref 1.2–2.2)
Albumin: 3.5 g/dL — ABNORMAL LOW (ref 4.0–5.0)
Alkaline Phosphatase: 152 IU/L — ABNORMAL HIGH (ref 44–121)
BUN/Creatinine Ratio: 10 (ref 9–23)
BUN: 7 mg/dL (ref 6–20)
Bilirubin Total: <0.2 mg/dL (ref 0.0–1.2)
CO2: 23 mmol/L (ref 20–29)
Calcium: 8.9 mg/dL (ref 8.7–10.2)
Chloride: 101 mmol/L (ref 96–106)
Creatinine, Ser: 0.68 mg/dL (ref 0.57–1.00)
Globulin, Total: 2.5 g/dL (ref 1.5–4.5)
Glucose: 79 mg/dL (ref 70–99)
Potassium: 4.1 mmol/L (ref 3.5–5.2)
Sodium: 138 mmol/L (ref 134–144)
Total Protein: 6 g/dL (ref 6.0–8.5)
eGFR: 122 mL/min/{1.73_m2} (ref >59)

## 2023-02-15 ENCOUNTER — Ambulatory Visit (INDEPENDENT_AMBULATORY_CARE_PROVIDER_SITE_OTHER): Payer: Medicaid Other | Admitting: *Deleted

## 2023-02-15 VITALS — BP 116/81 | HR 97

## 2023-02-15 DIAGNOSIS — Z3A35 35 weeks gestation of pregnancy: Secondary | ICD-10-CM | POA: Diagnosis not present

## 2023-02-15 DIAGNOSIS — O24410 Gestational diabetes mellitus in pregnancy, diet controlled: Secondary | ICD-10-CM | POA: Diagnosis not present

## 2023-02-15 LAB — POCT URINALYSIS DIPSTICK OB
Blood, UA: NEGATIVE
Glucose, UA: NEGATIVE
Ketones, UA: NEGATIVE
Leukocytes, UA: NEGATIVE
Nitrite, UA: NEGATIVE
POC,PROTEIN,UA: NEGATIVE

## 2023-02-15 NOTE — Progress Notes (Signed)
   NURSE VISIT- NST  SUBJECTIVE:  Andrea [name] is a 27 y.o. G1P1011 female at [redacted]w[redacted]d here for a NST for pregnancy complicated by Diabetes: A1DM} and FGR.  She reports active fetal movement, contractions: none, vaginal bleeding: none, membranes: intact.   OBJECTIVE:  BP 116/81   Pulse 97   LMP 06/24/2022 (Approximate)   Appears well, no apparent distress  Results for orders placed or performed in visit on 02/15/23 (from the past 24 hour(s))  POC Urinalysis Dipstick OB   Collection Time: 02/15/23  4:02 PM  Result Value Ref Range   Color, UA     Clarity, UA     Glucose, UA Negative Negative   Bilirubin, UA     Ketones, UA neg    Spec Grav, UA     Blood, UA neg    pH, UA     POC,PROTEIN,UA Negative Negative, Trace, Small (1+), Moderate (2+), Large (3+), 4+   Urobilinogen, UA     Nitrite, UA neg    Leukocytes, UA Negative Negative   Appearance     Odor      NST: FHR baseline 135 bpm, Variability: moderate, Accelerations:present, Decelerations:  Absent= Cat 1/reactive Toco: none   ASSESSMENT: G3P1011 at 325w2dith Diabetes: A1DM} and FGR NST reactive  PLAN: EFM strip reviewed by Dr. ErRip Harbour Recommendations: keep next appointment as scheduled    AmJanece Canterbury3/06/2023 4:22 PM

## 2023-02-16 ENCOUNTER — Other Ambulatory Visit: Payer: Self-pay | Admitting: Obstetrics & Gynecology

## 2023-02-16 DIAGNOSIS — O365930 Maternal care for other known or suspected poor fetal growth, third trimester, not applicable or unspecified: Secondary | ICD-10-CM

## 2023-02-16 DIAGNOSIS — O24410 Gestational diabetes mellitus in pregnancy, diet controlled: Secondary | ICD-10-CM

## 2023-02-16 DIAGNOSIS — F1120 Opioid dependence, uncomplicated: Secondary | ICD-10-CM

## 2023-02-19 ENCOUNTER — Ambulatory Visit (INDEPENDENT_AMBULATORY_CARE_PROVIDER_SITE_OTHER): Payer: Medicaid Other | Admitting: Obstetrics & Gynecology

## 2023-02-19 ENCOUNTER — Ambulatory Visit (INDEPENDENT_AMBULATORY_CARE_PROVIDER_SITE_OTHER): Payer: Medicaid Other

## 2023-02-19 ENCOUNTER — Encounter: Payer: Self-pay | Admitting: Obstetrics & Gynecology

## 2023-02-19 VITALS — BP 134/89 | HR 112 | Wt 192.0 lb

## 2023-02-19 DIAGNOSIS — O0992 Supervision of high risk pregnancy, unspecified, second trimester: Secondary | ICD-10-CM

## 2023-02-19 DIAGNOSIS — O24410 Gestational diabetes mellitus in pregnancy, diet controlled: Secondary | ICD-10-CM | POA: Diagnosis not present

## 2023-02-19 DIAGNOSIS — Z3A35 35 weeks gestation of pregnancy: Secondary | ICD-10-CM

## 2023-02-19 DIAGNOSIS — O1403 Mild to moderate pre-eclampsia, third trimester: Secondary | ICD-10-CM

## 2023-02-19 DIAGNOSIS — O0993 Supervision of high risk pregnancy, unspecified, third trimester: Secondary | ICD-10-CM

## 2023-02-19 DIAGNOSIS — O99323 Drug use complicating pregnancy, third trimester: Secondary | ICD-10-CM | POA: Diagnosis not present

## 2023-02-19 DIAGNOSIS — O365930 Maternal care for other known or suspected poor fetal growth, third trimester, not applicable or unspecified: Secondary | ICD-10-CM

## 2023-02-19 DIAGNOSIS — F1120 Opioid dependence, uncomplicated: Secondary | ICD-10-CM

## 2023-02-19 DIAGNOSIS — Z3480 Encounter for supervision of other normal pregnancy, unspecified trimester: Secondary | ICD-10-CM

## 2023-02-19 NOTE — Progress Notes (Signed)
[place] [id number] wks,cephalic,FHR A999333 bpm,posterior placenta gr 2,BPP 8/8,AFI 20 cm,RI .55,.55,.58=42%(limited because of continuous practice breathing)

## 2023-02-19 NOTE — Progress Notes (Signed)
HIGH-RISK PREGNANCY VISIT Patient name: Andrea [name] MRN [id number]  Date of birth: 1996-08-07 Chief Complaint:   Routine Prenatal Visit  History of Present Illness:   Andrea [name] is a 27 y.o. G71P1011 female at 64w6dwith an Estimated Date of Delivery: 03/20/23 being seen today for ongoing management of a high-risk pregnancy complicated by FGR 5%, normal Dopplers, A1DM with good CBG,     Today she reports no complaints. Contractions: Irritability. Vag. Bleeding: None.  Movement: Present. denies leaking of fluid.      09/18/2022    2:52 PM 01/27/2022   11:24 AM 10/28/2018    3:38 PM  Depression screen PHQ 2/9  Decreased Interest '2 1 2  '$ Down, Depressed, Hopeless 1 1 0  PHQ - 2 Score '3 2 2  '$ Altered sleeping '3 1 3  '$ Tired, decreased energy '3 1 3  '$ Change in appetite '2 1 3  '$ Feeling bad or failure about yourself  1 0 0  Trouble concentrating '1 1 3  '$ Moving slowly or fidgety/restless 1 0 3  Suicidal thoughts 0 0 0  PHQ-9 Score '14 6 17  '$ Difficult doing work/chores   Somewhat difficult        09/18/2022    2:52 PM 01/27/2022   11:24 AM  GAD 7 : Generalized Anxiety Score  Nervous, Anxious, on Edge 2 1  Control/stop worrying 2 1  Worry too much - different things 2 1  Trouble relaxing 2 1  Restless 2 1  Easily annoyed or irritable 2 1  Afraid - awful might happen 3 1  Total GAD 7 Score 15 7     Review of Systems:   Pertinent items are noted in HPI Denies abnormal vaginal discharge w/ itching/odor/irritation, headaches, visual changes, shortness of breath, chest pain, abdominal pain, severe nausea/vomiting, or problems with urination or bowel movements unless otherwise stated above. Pertinent History Reviewed:  Reviewed past medical,surgical, social, obstetrical and family history.  Reviewed problem list, medications and allergies. Physical Assessment:   Vitals:   02/19/23 1453  BP: 134/89  Pulse: (!) 112  Weight: 192 lb (87.1 kg)  Body mass index is 38.78  kg/m.           Physical Examination:   General appearance: alert, well appearing, and in no distress  Mental status: alert, oriented to person, place, and time  Skin: warm & dry   Extremities: Edema: None    Cardiovascular: normal heart rate noted  Respiratory: normal respiratory effort, no distress  Abdomen: gravid, soft, non-tender  Pelvic: Cervical exam deferred         Fetal Status:     Movement: Present    Fetal Surveillance Testing today: BPP 8/8 42% UAD   Chaperone: N/A    No results found for this or any previous visit (from the past 24 hour(s)).  Assessment & Plan:  High-risk pregnancy: G3P1011 at 377w6dith an Estimated Date of Delivery: 03/20/23      ICD-10-CM   1. Supervision of high risk pregnancy in second trimester  O09.92     2. Diet controlled gestational diabetes mellitus (GDM) in third trimester  O24.410     3. Poor fetal growth affecting management of mother in third trimester, single or unspecified fetus  O3O73.5930     4Mild pre-eclampsia in third trimester  O14.03         Meds: No orders of the defined types were placed in this encounter.   Orders: No orders of  the defined types were placed in this encounter.    Labs/procedures today: U/S  Treatment Plan:  twice weekly surveillance  IOL 37-38 weeks(37th week)  Follow-up: Return for keep scheduled.   Future Appointments  Date Time Provider [place]  02/22/2023  3:30 PM [facility]-FTOBGYN NURSE [facility]-FT FTOBGYN  02/26/2023  3:00 PM [place] - FTOBGYN [place] C[facility]-FTIMG None  02/26/2023  3:50 PM Cresenzo-Dishmon, [place], CNM C[facility]-FT FTOBGYN  03/01/2023 10:10 AM [facility]-FTOBGYN NURSE C[facility]-FT FTOBGYN  03/05/2023  3:10 PM [facility]-FTOBGYN NURSE [facility]-FT FTOBGYN  03/05/2023  3:30 PM Janyth Pupa, DO [facility]-FT FTOBGYN  03/08/2023  3:30 PM [facility]-FTOBGYN NURSE [facility]-FT FTOBGYN  03/12/2023  2:15 PM [place] - FTOBGYN [place] C[facility]-FTIMG None  03/12/2023  3:10 PM Florian Buff, MD [facility]-FT FTOBGYN  03/15/2023  3:10 PM [facility]-FTOBGYN NURSE C[facility]-FT  FTOBGYN  03/19/2023  3:00 PM [place] - FTOBGYN [place] C[facility]-FTIMG None  03/19/2023  3:50 PM Andrick Rust, Mertie Clause, MD [facility]-FT FTOBGYN    No orders of the defined types were placed in this encounter.  Florian Buff  Attending Physician for the [facility] 02/19/2023 3:11 PM

## 2023-02-22 ENCOUNTER — Ambulatory Visit (INDEPENDENT_AMBULATORY_CARE_PROVIDER_SITE_OTHER): Payer: Medicaid Other | Admitting: *Deleted

## 2023-02-22 VITALS — BP 120/83 | HR 104 | Wt 191.0 lb

## 2023-02-22 DIAGNOSIS — Z3A36 36 weeks gestation of pregnancy: Secondary | ICD-10-CM | POA: Diagnosis not present

## 2023-02-22 DIAGNOSIS — O0993 Supervision of high risk pregnancy, unspecified, third trimester: Secondary | ICD-10-CM | POA: Diagnosis not present

## 2023-02-22 DIAGNOSIS — O365930 Maternal care for other known or suspected poor fetal growth, third trimester, not applicable or unspecified: Secondary | ICD-10-CM

## 2023-02-22 DIAGNOSIS — O24410 Gestational diabetes mellitus in pregnancy, diet controlled: Secondary | ICD-10-CM

## 2023-02-22 DIAGNOSIS — O288 Other abnormal findings on antenatal screening of mother: Secondary | ICD-10-CM

## 2023-02-22 DIAGNOSIS — O0992 Supervision of high risk pregnancy, unspecified, second trimester: Secondary | ICD-10-CM

## 2023-02-22 DIAGNOSIS — Z3480 Encounter for supervision of other normal pregnancy, unspecified trimester: Secondary | ICD-10-CM

## 2023-02-22 DIAGNOSIS — Z331 Pregnant state, incidental: Secondary | ICD-10-CM

## 2023-02-22 DIAGNOSIS — Z1389 Encounter for screening for other disorder: Secondary | ICD-10-CM

## 2023-02-22 LAB — POCT URINALYSIS DIPSTICK OB
Glucose, UA: NEGATIVE
Ketones, UA: NEGATIVE
Nitrite, UA: NEGATIVE
POC,PROTEIN,UA: NEGATIVE

## 2023-02-22 NOTE — Progress Notes (Addendum)
   NURSE VISIT- NST  SUBJECTIVE:  Andrea [name] is a 27 y.o. G49P1011 female at [redacted]w[redacted]d, here for a NST for pregnancy complicated by Diabetes: A1DM} and FGR and mild pre-eclampsia.  She reports active fetal movement, contractions: none, vaginal bleeding: none, membranes: intact.   OBJECTIVE:  BP 120/83   Pulse (!) 104   Wt 191 lb (86.6 kg)   LMP 06/24/2022 (Approximate)   BMI 38.58 kg/m   Appears well, no apparent distress  Results for orders placed or performed in visit on 02/22/23 (from the past 24 hour(s))  POC Urinalysis Dipstick OB   Collection Time: 02/22/23  3:48 PM  Result Value Ref Range   Color, UA     Clarity, UA     Glucose, UA Negative Negative   Bilirubin, UA     Ketones, UA negatve    Spec Grav, UA     Blood, UA small    pH, UA     POC,PROTEIN,UA Negative Negative, Trace, Small (1+), Moderate (2+), Large (3+), 4+   Urobilinogen, UA     Nitrite, UA negative    Leukocytes, UA Trace (A) Negative   Appearance     Odor      NST: FHR baseline 130 bpm, Variability: moderate, Accelerations:present, Decelerations:  Absent= Cat 1/reactive Toco: none   ASSESSMENT: G3P1011 at [redacted]w[redacted]d with Diabetes: A1DM} and FGR, mild pre-e NST reactive  PLAN: EFM strip reviewed by Nigel Berthold, CNM   Recommendations: keep next appointment as scheduled    Alice Rieger  02/22/2023 4:11 PM Chart reviewed for nurse visit. Agree with plan of care.  Christin Fudge, [place] 02/22/2023 7:43 PM

## 2023-02-23 ENCOUNTER — Other Ambulatory Visit: Payer: Self-pay | Admitting: Obstetrics & Gynecology

## 2023-02-23 DIAGNOSIS — O24410 Gestational diabetes mellitus in pregnancy, diet controlled: Secondary | ICD-10-CM

## 2023-02-23 DIAGNOSIS — O365930 Maternal care for other known or suspected poor fetal growth, third trimester, not applicable or unspecified: Secondary | ICD-10-CM

## 2023-02-26 ENCOUNTER — Encounter: Payer: Self-pay | Admitting: Advanced Practice Midwife

## 2023-02-26 ENCOUNTER — Ambulatory Visit (INDEPENDENT_AMBULATORY_CARE_PROVIDER_SITE_OTHER): Payer: Medicaid Other | Admitting: Advanced Practice Midwife

## 2023-02-26 ENCOUNTER — Ambulatory Visit (INDEPENDENT_AMBULATORY_CARE_PROVIDER_SITE_OTHER): Payer: Medicaid Other

## 2023-02-26 ENCOUNTER — Other Ambulatory Visit (HOSPITAL_COMMUNITY)
Admission: RE | Admit: 2023-02-26 | Discharge: 2023-02-26 | Disposition: A | Discharge status: Home / Self Care | Payer: Medicaid Other | Source: Ambulatory Visit | Attending: Advanced Practice Midwife | Admitting: Advanced Practice Midwife

## 2023-02-26 VITALS — BP 127/89 | HR 98 | Wt 190.0 lb

## 2023-02-26 DIAGNOSIS — F1120 Opioid dependence, uncomplicated: Secondary | ICD-10-CM

## 2023-02-26 DIAGNOSIS — Z3A36 36 weeks gestation of pregnancy: Secondary | ICD-10-CM

## 2023-02-26 DIAGNOSIS — O365930 Maternal care for other known or suspected poor fetal growth, third trimester, not applicable or unspecified: Secondary | ICD-10-CM

## 2023-02-26 DIAGNOSIS — O24410 Gestational diabetes mellitus in pregnancy, diet controlled: Secondary | ICD-10-CM | POA: Diagnosis not present

## 2023-02-26 DIAGNOSIS — O1403 Mild to moderate pre-eclampsia, third trimester: Secondary | ICD-10-CM

## 2023-02-26 DIAGNOSIS — Z3480 Encounter for supervision of other normal pregnancy, unspecified trimester: Secondary | ICD-10-CM

## 2023-02-26 DIAGNOSIS — O0992 Supervision of high risk pregnancy, unspecified, second trimester: Secondary | ICD-10-CM

## 2023-02-26 DIAGNOSIS — O99323 Drug use complicating pregnancy, third trimester: Secondary | ICD-10-CM

## 2023-02-26 DIAGNOSIS — O0993 Supervision of high risk pregnancy, unspecified, third trimester: Secondary | ICD-10-CM

## 2023-02-26 LAB — OB RESULTS CONSOLE GC/CHLAMYDIA
Chlamydia: NEGATIVE
Neisseria Gonorrhea: NEGATIVE

## 2023-02-26 NOTE — Progress Notes (Signed)
[place] [id number] wks,cephalic,FHR A999333 bpm,posterior placenta gr 2,AFI 20 cm,RI .58,.58,.55,.61=59%,BPP 8/8

## 2023-02-26 NOTE — Progress Notes (Signed)
HIGH-RISK PREGNANCY VISIT Patient name: Andrea [name] MRN [id number]  Date of birth: [birth year] Chief Complaint:   Routine Prenatal Visit  History of Present Illness:   Andrea [name] is a 27 y.o. G42P1011 female at [redacted]w[redacted]d with an Estimated Date of Delivery: 03/20/23 being seen today for ongoing management of a high-risk pregnancy complicated by A999333, mild preeclampsia dx at 32 weeks (^ BP, proteinuria) and FGR w/normal dopplers.  Last EFW 12% on 02/12/23.     Today she reports FBS <95, 2hr pp <120 w/a few in the 120's. . Contractions: Not present. Vag. Bleeding: None.  Movement: Present. denies leaking of fluid.      09/18/2022    2:52 PM 01/27/2022   11:24 AM 10/28/2018    3:38 PM  Depression screen PHQ 2/9  Decreased Interest 2 1 2   Down, Depressed, Hopeless 1 1 0  PHQ - 2 Score 3 2 2   Altered sleeping 3 1 3   Tired, decreased energy 3 1 3   Change in appetite 2 1 3   Feeling bad or failure about yourself  1 0 0  Trouble concentrating 1 1 3   Moving slowly or fidgety/restless 1 0 3  Suicidal thoughts 0 0 0  PHQ-9 Score 14 6 17   Difficult doing work/chores   Somewhat difficult        09/18/2022    2:52 PM 01/27/2022   11:24 AM  GAD 7 : Generalized Anxiety Score  Nervous, Anxious, on Edge 2 1  Control/stop worrying 2 1  Worry too much - different things 2 1  Trouble relaxing 2 1  Restless 2 1  Easily annoyed or irritable 2 1  Afraid - awful might happen 3 1  Total GAD 7 Score 15 7     Review of Systems:   Pertinent items are noted in HPI Denies abnormal vaginal discharge w/ itching/odor/irritation, headaches, visual changes, shortness of breath, chest pain, abdominal pain, severe nausea/vomiting, or problems with urination or bowel movements unless otherwise stated above. Pertinent History Reviewed:  Reviewed past medical,surgical, social, obstetrical and family history.  Reviewed problem list, medications and allergies. Physical Assessment:   Vitals:   02/26/23  1543  BP: 127/89  Pulse: 98  Weight: 190 lb (86.2 kg)  Body mass index is 38.38 kg/m.           Physical Examination:   General appearance: alert, well appearing, and in no distress  Mental status: alert, oriented to person, place, and time  Skin: warm & dry   Extremities: Edema: Trace    Cardiovascular: normal heart rate noted  Respiratory: normal respiratory effort, no distress  Abdomen: gravid, soft, non-tender  Pelvic: Cervical exam performed  Dilation: 3.5 Effacement (%): 50 Station: -2  Fetal Status:     Movement: Present Presentation: Vertex  Fetal Surveillance Testing today: [place] 99991111 wks,cephalic,FHR A999333 bpm,posterior placenta gr 2,AFI 20 cm,RI .58,.58,.55,.61=59%,BPP 8/8    Chaperone: Latisha Cresenzo    Assessment & Plan:  High-risk pregnancy: G3P1011 at [redacted]w[redacted]d with an Estimated Date of Delivery: 03/20/23   1. Supervision of high risk pregnancy in second trimester  - Cervicovaginal ancillary only - Culture, beta strep (group b only)  2. [redacted] weeks gestation of pregnancy   3. Mild pre-eclampsia in third trimester BPs normal today  4. Diet controlled gestational diabetes mellitus (GDM) in third trimester BS within range  5. Pregnancy complicated by Suboxone maintenance, antepartum ([place]) Declined NAS consult (was on suboxone last pregnancy, feels comfortable w/POC for baby)  6. Poor fetal growth affecting management of mother in third trimester, single or unspecified fetus EFW 12% last [place]   PLAN:    Discussed timing of IOL for mild preeclampsia, A1DM, FGR, now @ 12% w/normal dopplers.  Pt prefers to wait until next Monday, will be 37.6 weeks.  There are no slots for a scheduled IOL, so she was advised to come to appointment as scheduled on 3/25 and she will be directly admitted.    Orders:  Orders Placed This Encounter  Procedures   Culture, beta strep (group b only)       Future Appointments  Date Time Provider [place]  03/01/2023 10:10 AM  [facility] NURSE [facility]  03/05/2023  3:10 PM [facility] NURSE [facility]  03/05/2023  3:30 PM Janyth Pupa, DO [facility]    Orders Placed This Encounter  Procedures   Culture, beta strep (group b only)   Christin Fudge , DNP, [facility] 02/26/2023 9:52 PM

## 2023-02-27 DIAGNOSIS — Z3A36 36 weeks gestation of pregnancy: Secondary | ICD-10-CM | POA: Diagnosis not present

## 2023-02-27 DIAGNOSIS — O0992 Supervision of high risk pregnancy, unspecified, second trimester: Secondary | ICD-10-CM | POA: Diagnosis not present

## 2023-02-27 LAB — OB RESULTS CONSOLE GBS: GBS: POSITIVE

## 2023-02-28 LAB — CERVICOVAGINAL ANCILLARY ONLY
Chlamydia: NEGATIVE
Comment: NEGATIVE
Comment: NORMAL
Neisseria Gonorrhea: NEGATIVE

## 2023-03-01 ENCOUNTER — Ambulatory Visit (INDEPENDENT_AMBULATORY_CARE_PROVIDER_SITE_OTHER): Payer: Medicaid Other | Admitting: *Deleted

## 2023-03-01 VITALS — BP 136/82 | HR 110 | Wt 190.0 lb

## 2023-03-01 DIAGNOSIS — O365990 Maternal care for other known or suspected poor fetal growth, unspecified trimester, not applicable or unspecified: Secondary | ICD-10-CM

## 2023-03-01 DIAGNOSIS — O1403 Mild to moderate pre-eclampsia, third trimester: Secondary | ICD-10-CM | POA: Diagnosis not present

## 2023-03-01 DIAGNOSIS — O24410 Gestational diabetes mellitus in pregnancy, diet controlled: Secondary | ICD-10-CM | POA: Diagnosis not present

## 2023-03-01 DIAGNOSIS — Z3480 Encounter for supervision of other normal pregnancy, unspecified trimester: Secondary | ICD-10-CM

## 2023-03-01 DIAGNOSIS — Z3A37 37 weeks gestation of pregnancy: Secondary | ICD-10-CM | POA: Diagnosis not present

## 2023-03-01 NOTE — Progress Notes (Addendum)
1  NURSE VISIT- NST  SUBJECTIVE:  Andrea [name] is a 27 y.o. G53P1011 female at [redacted]w[redacted]d, here for a NST for pregnancy complicated by mild pre-e Diabetes: A1DM} and FGR.  She reports active fetal movement, contractions: none, vaginal bleeding: none, membranes: intact.   OBJECTIVE:  BP 136/82   Pulse (!) 110   Wt 190 lb (86.2 kg)   LMP 06/24/2022 (Approximate)   BMI 38.38 kg/m   Appears well, no apparent distress  No results found for this or any previous visit (from the past 24 hour(s)).  NST: FHR baseline 140 bpm, Variability: moderate, Accelerations:present, Decelerations:  Absent= Cat 1/reactive Toco: none   ASSESSMENT: G3P1011 at [redacted]w[redacted]d with mild pre-eclampsia, Diabetes: A1DM} and FGR NST reactive  PLAN: EFM strip reviewed by Nigel Berthold, CNM   Recommendations: keep next appointment as scheduled    Alice Rieger  03/01/2023 11:07 AM Chart reviewed for nurse visit. Agree with plan of care.  Christin Fudge, [place] 03/01/2023 11:52 PM

## 2023-03-03 LAB — CULTURE, BETA STREP (GROUP B ONLY): Strep Gp B Culture: POSITIVE — AB

## 2023-03-04 ENCOUNTER — Other Ambulatory Visit: Payer: Self-pay | Admitting: Obstetrics & Gynecology

## 2023-03-04 DIAGNOSIS — O1403 Mild to moderate pre-eclampsia, third trimester: Secondary | ICD-10-CM

## 2023-03-05 ENCOUNTER — Inpatient Hospital Stay (HOSPITAL_COMMUNITY)
Admission: RE | Admit: 2023-03-05 | Discharge: 2023-03-08 | DRG: 806 | Disposition: A | Discharge status: Home / Self Care | Payer: Medicaid Other | Attending: Family Medicine | Admitting: Family Medicine

## 2023-03-05 ENCOUNTER — Encounter: Payer: Self-pay | Admitting: Obstetrics & Gynecology

## 2023-03-05 ENCOUNTER — Encounter (HOSPITAL_COMMUNITY): Payer: Self-pay | Admitting: Obstetrics & Gynecology

## 2023-03-05 ENCOUNTER — Ambulatory Visit (INDEPENDENT_AMBULATORY_CARE_PROVIDER_SITE_OTHER): Payer: Medicaid Other | Admitting: Obstetrics & Gynecology

## 2023-03-05 ENCOUNTER — Ambulatory Visit: Payer: Medicaid Other

## 2023-03-05 VITALS — BP 141/89 | HR 98 | Wt 190.0 lb

## 2023-03-05 DIAGNOSIS — Z3A37 37 weeks gestation of pregnancy: Secondary | ICD-10-CM | POA: Diagnosis not present

## 2023-03-05 DIAGNOSIS — O99824 Streptococcus B carrier state complicating childbirth: Secondary | ICD-10-CM | POA: Diagnosis not present

## 2023-03-05 DIAGNOSIS — O9902 Anemia complicating childbirth: Secondary | ICD-10-CM | POA: Diagnosis present

## 2023-03-05 DIAGNOSIS — O24420 Gestational diabetes mellitus in childbirth, diet controlled: Secondary | ICD-10-CM | POA: Diagnosis not present

## 2023-03-05 DIAGNOSIS — O24410 Gestational diabetes mellitus in pregnancy, diet controlled: Secondary | ICD-10-CM

## 2023-03-05 DIAGNOSIS — Z8632 Personal history of gestational diabetes: Secondary | ICD-10-CM | POA: Diagnosis present

## 2023-03-05 DIAGNOSIS — O09893 Supervision of other high risk pregnancies, third trimester: Secondary | ICD-10-CM

## 2023-03-05 DIAGNOSIS — O1494 Unspecified pre-eclampsia, complicating childbirth: Secondary | ICD-10-CM | POA: Diagnosis not present

## 2023-03-05 DIAGNOSIS — Z3A38 38 weeks gestation of pregnancy: Secondary | ICD-10-CM

## 2023-03-05 DIAGNOSIS — Z331 Pregnant state, incidental: Secondary | ICD-10-CM

## 2023-03-05 DIAGNOSIS — O1404 Mild to moderate pre-eclampsia, complicating childbirth: Secondary | ICD-10-CM | POA: Diagnosis not present

## 2023-03-05 DIAGNOSIS — O1414 Severe pre-eclampsia complicating childbirth: Secondary | ICD-10-CM | POA: Diagnosis not present

## 2023-03-05 DIAGNOSIS — F1121 Opioid dependence, in remission: Secondary | ICD-10-CM

## 2023-03-05 DIAGNOSIS — D62 Acute posthemorrhagic anemia: Secondary | ICD-10-CM | POA: Diagnosis not present

## 2023-03-05 DIAGNOSIS — O365930 Maternal care for other known or suspected poor fetal growth, third trimester, not applicable or unspecified: Secondary | ICD-10-CM | POA: Diagnosis not present

## 2023-03-05 DIAGNOSIS — Z720 Tobacco use: Secondary | ICD-10-CM

## 2023-03-05 DIAGNOSIS — Z3490 Encounter for supervision of normal pregnancy, unspecified, unspecified trimester: Secondary | ICD-10-CM

## 2023-03-05 DIAGNOSIS — Z3480 Encounter for supervision of other normal pregnancy, unspecified trimester: Principal | ICD-10-CM

## 2023-03-05 DIAGNOSIS — Z87891 Personal history of nicotine dependence: Secondary | ICD-10-CM

## 2023-03-05 DIAGNOSIS — O365990 Maternal care for other known or suspected poor fetal growth, unspecified trimester, not applicable or unspecified: Secondary | ICD-10-CM | POA: Diagnosis present

## 2023-03-05 DIAGNOSIS — Z8759 Personal history of other complications of pregnancy, childbirth and the puerperium: Secondary | ICD-10-CM | POA: Diagnosis present

## 2023-03-05 DIAGNOSIS — O1403 Mild to moderate pre-eclampsia, third trimester: Secondary | ICD-10-CM

## 2023-03-05 DIAGNOSIS — O288 Other abnormal findings on antenatal screening of mother: Secondary | ICD-10-CM

## 2023-03-05 DIAGNOSIS — Z1389 Encounter for screening for other disorder: Secondary | ICD-10-CM

## 2023-03-05 DIAGNOSIS — O24419 Gestational diabetes mellitus in pregnancy, unspecified control: Secondary | ICD-10-CM | POA: Diagnosis present

## 2023-03-05 DIAGNOSIS — O326XX0 Maternal care for compound presentation, not applicable or unspecified: Secondary | ICD-10-CM | POA: Diagnosis not present

## 2023-03-05 DIAGNOSIS — O1400 Mild to moderate pre-eclampsia, unspecified trimester: Secondary | ICD-10-CM | POA: Diagnosis present

## 2023-03-05 DIAGNOSIS — O99334 Smoking (tobacco) complicating childbirth: Secondary | ICD-10-CM | POA: Diagnosis not present

## 2023-03-05 DIAGNOSIS — O99820 Streptococcus B carrier state complicating pregnancy: Secondary | ICD-10-CM | POA: Diagnosis not present

## 2023-03-05 DIAGNOSIS — F1120 Opioid dependence, uncomplicated: Secondary | ICD-10-CM

## 2023-03-05 LAB — COMPREHENSIVE METABOLIC PANEL
ALT: 43 U/L (ref 0–44)
AST: 36 U/L (ref 15–41)
Albumin: 2.3 g/dL — ABNORMAL LOW (ref 3.5–5.0)
Alkaline Phosphatase: 156 U/L — ABNORMAL HIGH (ref 38–126)
Anion gap: 13 (ref 5–15)
BUN: 6 mg/dL (ref 6–20)
CO2: 21 mmol/L — ABNORMAL LOW (ref 22–32)
Calcium: 8.6 mg/dL — ABNORMAL LOW (ref 8.9–10.3)
Chloride: 100 mmol/L (ref 98–111)
Creatinine, Ser: 0.64 mg/dL (ref 0.44–1.00)
GFR, Estimated: >60 mL/min (ref >60)
Glucose, Bld: 136 mg/dL — ABNORMAL HIGH (ref 70–99)
Potassium: 3.7 mmol/L (ref 3.5–5.1)
Sodium: 134 mmol/L — ABNORMAL LOW (ref 135–145)
Total Bilirubin: 0.4 mg/dL (ref 0.3–1.2)
Total Protein: 5.8 g/dL — ABNORMAL LOW (ref 6.5–8.1)

## 2023-03-05 LAB — OB RESULTS CONSOLE RPR: RPR: NONREACTIVE

## 2023-03-05 LAB — CBC
HCT: 31.8 % — ABNORMAL LOW (ref 36.0–46.0)
Hemoglobin: 10.4 g/dL — ABNORMAL LOW (ref 12.0–15.0)
MCH: 24.6 pg — ABNORMAL LOW (ref 26.0–34.0)
MCHC: 32.7 g/dL (ref 30.0–36.0)
MCV: 75.2 fL — ABNORMAL LOW (ref 80.0–100.0)
Platelets: 230 10*3/uL (ref 150–400)
RBC: 4.23 MIL/uL (ref 3.87–5.11)
RDW: 14.6 % (ref 11.5–15.5)
WBC: 11 10*3/uL — ABNORMAL HIGH (ref 4.0–10.5)
nRBC: 0 % (ref 0.0–0.2)

## 2023-03-05 LAB — POCT URINALYSIS DIPSTICK OB
Blood, UA: NEGATIVE
Glucose, UA: NEGATIVE
Ketones, UA: NEGATIVE
Leukocytes, UA: NEGATIVE
Nitrite, UA: NEGATIVE
POC,PROTEIN,UA: NEGATIVE

## 2023-03-05 LAB — GLUCOSE, CAPILLARY
Glucose-Capillary: 153 mg/dL — ABNORMAL HIGH (ref 70–99)
Glucose-Capillary: 98 mg/dL (ref 70–99)

## 2023-03-05 LAB — PROTEIN / CREATININE RATIO, URINE
Creatinine, Urine: 30 mg/dL
Total Protein, Urine: <6 mg/dL

## 2023-03-05 LAB — TYPE AND SCREEN
ABO/RH(D): A POS
Antibody Screen: NEGATIVE

## 2023-03-05 MED ORDER — OXYTOCIN-SODIUM CHLORIDE 30-0.9 UT/500ML-% IV SOLN
1.0000 m[IU]/min | INTRAVENOUS | Status: DC
  Administered 2023-03-05: 2 m[IU]/min via INTRAVENOUS
  Filled 2023-03-05 (×2): qty 500

## 2023-03-05 MED ORDER — BUPRENORPHINE HCL 2 MG SL SUBL
4.0000 mg | SUBLINGUAL_TABLET | Freq: Two times a day (BID) | SUBLINGUAL | Status: DC
  Administered 2023-03-05 – 2023-03-08 (×6): 4 mg via SUBLINGUAL
  Filled 2023-03-05 (×6): qty 2

## 2023-03-05 MED ORDER — ACETAMINOPHEN 325 MG PO TABS: 650.0000 mg | ORAL_TABLET | ORAL | Status: DC | PRN

## 2023-03-05 MED ORDER — PENICILLIN G POT IN DEXTROSE 60000 UNIT/ML IV SOLN
3.0000 10*6.[IU] | INTRAVENOUS | Status: DC
  Administered 2023-03-05 – 2023-03-06 (×5): 3 10*6.[IU] via INTRAVENOUS
  Filled 2023-03-05 (×5): qty 50

## 2023-03-05 MED ORDER — SOD CITRATE-CITRIC ACID 500-334 MG/5ML PO SOLN: 30.0000 mL | ORAL | Status: DC | PRN

## 2023-03-05 MED ORDER — TERBUTALINE SULFATE 1 MG/ML IJ SOLN
0.2500 mg | Freq: Once | INTRAMUSCULAR | Status: DC | PRN
  Filled 2023-03-05: qty 1

## 2023-03-05 MED ORDER — SODIUM CHLORIDE 0.9 % IV SOLN
5.0000 10*6.[IU] | Freq: Once | INTRAVENOUS | Status: AC
  Administered 2023-03-05: 5 10*6.[IU] via INTRAVENOUS
  Filled 2023-03-05: qty 5

## 2023-03-05 MED ORDER — LACTATED RINGERS IV SOLN
500.0000 mL | INTRAVENOUS | Status: DC | PRN
  Administered 2023-03-06 (×2): 250 mL via INTRAVENOUS

## 2023-03-05 MED ORDER — ONDANSETRON HCL 4 MG/2ML IJ SOLN: 4.0000 mg | Freq: Four times a day (QID) | INTRAMUSCULAR | Status: DC | PRN

## 2023-03-05 MED ORDER — OXYTOCIN-SODIUM CHLORIDE 30-0.9 UT/500ML-% IV SOLN
2.5000 [IU]/h | INTRAVENOUS | Status: DC
  Administered 2023-03-06: 2.5 [IU]/h via INTRAVENOUS

## 2023-03-05 MED ORDER — OXYTOCIN BOLUS FROM INFUSION
333.0000 mL | Freq: Once | INTRAVENOUS | Status: AC
  Administered 2023-03-06: 333 mL via INTRAVENOUS

## 2023-03-05 MED ORDER — LACTATED RINGERS IV SOLN: INTRAVENOUS | Status: DC

## 2023-03-05 MED ORDER — LIDOCAINE HCL (PF) 1 % IJ SOLN: 30.0000 mL | INTRAMUSCULAR | Status: DC | PRN
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
## 2023-03-05 NOTE — Progress Notes (Signed)
HIGH-RISK PREGNANCY VISIT Patient name: Andrea [name] MRN [id number]  Date of birth: 06/02/1996 Chief Complaint:   High Risk OB   History of Present Illness:   Andrea [name] is a 27 y.o. G38P1011 female at [redacted]w[redacted]d with an Estimated Date of Delivery: 03/20/23 being seen today for ongoing management of a high-risk pregnancy complicated by:  -FGR -preeclampsia no severe features -OUD- on suboxone 2mg  daily -GDMA1- diet controlled  Today she reports  irregular contractions .   No vaginal bleeding, no LOF. +Fetal movement     09/18/2022    2:52 PM 01/27/2022   11:24 AM 10/28/2018    3:38 PM  Depression screen PHQ 2/9  Decreased Interest 2 1 2   Down, Depressed, Hopeless 1 1 0  PHQ - 2 Score 3 2 2   Altered sleeping 3 1 3   Tired, decreased energy 3 1 3   Change in appetite 2 1 3   Feeling bad or failure about yourself  1 0 0  Trouble concentrating 1 1 3   Moving slowly or fidgety/restless 1 0 3  Suicidal thoughts 0 0 0  PHQ-9 Score 14 6 17   Difficult doing work/chores   Somewhat difficult     Current Outpatient Medications  Medication Instructions   Accu-Chek Softclix Lancets lancets Use as instructed to check blood sugar 4 times daily   albuterol (VENTOLIN HFA) 108 (90 Base) MCG/ACT inhaler 2 puffs, Inhalation, Every 6 hours PRN   Blood Glucose Monitoring Suppl (ACCU-CHEK GUIDE ME) w/Device KIT 1 each, Does not apply, 4 times daily   buprenorphine (SUBUTEX) 4 mg, Sublingual, 2 times daily   glucose blood (ACCU-CHEK GUIDE) test strip Use as instructed to check blood sugar 4 times daily   Prenatal Vit-Fe Fumarate-FA (PRENATAL VITAMIN PO) 1 tablet, Oral, Daily     Review of Systems:   Pertinent items are noted in HPI Denies abnormal vaginal discharge w/ itching/odor/irritation, headaches, visual changes, shortness of breath, chest pain, abdominal pain, severe nausea/vomiting, or problems with urination or bowel movements unless otherwise stated above. Pertinent History  Reviewed:  Reviewed past medical,surgical, social, obstetrical and family history.  Reviewed problem list, medications and allergies. Physical Assessment:  BP: 143/89           Physical Examination:   General appearance: appears uncomfortable  Mental status: anxious  Skin: warm & dry   Extremities:  minimal edema    Cardiovascular: normal heart rate noted  Respiratory: normal respiratory effort, no distress  Abdomen: gravid, soft, non-tender  Pelvic: Cervical exam deferred         Fetal Status:  NST today  NST being performed due to preeclampsia, FGR   Fetal Monitoring:  Baseline: 145 bpm, Variability: moderate, Accelerations: present, The accelerations are >15 bpm and more than 2 in 20 minutes, and Decelerations: Absent     Final diagnosis:   Reactive NST   Fetal Surveillance Testing today: NST   Chaperone: N/A    Results for orders placed or performed in visit on 03/05/23 (from the past 24 hour(s))  POC Urinalysis Dipstick OB   Collection Time: 03/05/23  3:49 PM  Result Value Ref Range   Color, UA     Clarity, UA     Glucose, UA Negative Negative   Bilirubin, UA     Ketones, UA negative    Spec Grav, UA     Blood, UA negative    pH, UA     POC,PROTEIN,UA Negative Negative, Trace, Small (1+), Moderate (2+), Large (3+), 4+  Urobilinogen, UA     Nitrite, UA negative    Leukocytes, UA Negative Negative   Appearance     Odor       Assessment & Plan:  High-risk pregnancy: G3P1011 at [redacted]w[redacted]d with an Estimated Date of Delivery: 03/20/23   Reactive NST  1) Preeclampsia -no severe features -no meds, BP remain in mild range -currently asymptomatic -unable to previously schedule for IOL- no available time  2) FGR- improved -last growth- 3/4, EFW 2169g (12%), last growth 9th percentile -dopplers remain normal  3) GDMA1 -diet controlled  -GBS positive- PCN in labor -tobacco use  Meds: No orders of the defined types were placed in this  encounter.   Labs/procedures today: NST  Treatment Plan:  plan for IOL today, pt sent to L&D    Follow-up: Return in about 1 week (around 03/12/2023) for BP check, 5-6 wks postpartum visit.   No future appointments.  No orders of the defined types were placed in this encounter.   Janyth Pupa, DO Attending [place], [facility], [place]

## 2023-03-05 NOTE — Progress Notes (Signed)
Labor Progress Note  Andrea [name] is a 27 y.o. G3P1011 at [redacted]w[redacted]d presented for IOL for FGR, PreE w/o SF, and A1GDM.  S: She is doing well, feeling the contractions but they feel like bad period cramps right now. Desires to wait for epidural so she can continue to walk around. States that she had a large meal before coming in but has not had much to eat since then.  O:  BP 135/78   Pulse 98   Temp 98.1 F (36.7 C) (Oral)   Resp 16   Ht 4\' 11"  (1.499 m)   Wt 86.6 kg   LMP 06/24/2022 (Approximate)   BMI 38.58 kg/m  EFM: 150bpm/Moderate variability/ 15x15 accels/ None decels  CVE: Dilation: 3 Effacement (%): 50 Station: -2 Presentation: Vertex Exam by:: Krystal Eaton RN   A&P: 27 y.o. [id number] [redacted]w[redacted]d  here for IOL as above  #Labor: Progressing well. Currently on pitocin. Will wait until next check, and until she is feeling strong contractions to get epidural, per patient preference. Will offer AROM at that time. #Pain: Epidural per patient preference. #FWB: CAT 1 #GBS positive PCN  A1GDM CBG 153.  -start endotool if >120    PreE w/o SF BP wnl.  -Continue to monitor   OUD -Continue subutex 4 mg BID  Enid Baas, MD FMOB Fellow, Faculty practice [place] for [facility] 03/05/23  9:47 PM

## 2023-03-06 ENCOUNTER — Inpatient Hospital Stay (HOSPITAL_COMMUNITY): Payer: Medicaid Other | Admitting: Anesthesiology

## 2023-03-06 ENCOUNTER — Encounter (HOSPITAL_COMMUNITY): Payer: Self-pay | Admitting: Obstetrics & Gynecology

## 2023-03-06 DIAGNOSIS — O164 Unspecified maternal hypertension, complicating childbirth: Secondary | ICD-10-CM | POA: Diagnosis not present

## 2023-03-06 DIAGNOSIS — O99334 Smoking (tobacco) complicating childbirth: Secondary | ICD-10-CM | POA: Diagnosis not present

## 2023-03-06 DIAGNOSIS — O1414 Severe pre-eclampsia complicating childbirth: Secondary | ICD-10-CM | POA: Diagnosis not present

## 2023-03-06 DIAGNOSIS — O99820 Streptococcus B carrier state complicating pregnancy: Secondary | ICD-10-CM | POA: Diagnosis not present

## 2023-03-06 DIAGNOSIS — O24420 Gestational diabetes mellitus in childbirth, diet controlled: Secondary | ICD-10-CM | POA: Diagnosis not present

## 2023-03-06 DIAGNOSIS — Z3A37 37 weeks gestation of pregnancy: Secondary | ICD-10-CM | POA: Diagnosis not present

## 2023-03-06 DIAGNOSIS — Z3A38 38 weeks gestation of pregnancy: Secondary | ICD-10-CM | POA: Diagnosis not present

## 2023-03-06 DIAGNOSIS — O24429 Gestational diabetes mellitus in childbirth, unspecified control: Secondary | ICD-10-CM | POA: Diagnosis not present

## 2023-03-06 DIAGNOSIS — O365930 Maternal care for other known or suspected poor fetal growth, third trimester, not applicable or unspecified: Secondary | ICD-10-CM | POA: Diagnosis not present

## 2023-03-06 LAB — CBC
HCT: 30.4 % — ABNORMAL LOW (ref 36.0–46.0)
HCT: 29.2 % — ABNORMAL LOW (ref 36.0–46.0)
Hemoglobin: 9.8 g/dL — ABNORMAL LOW (ref 12.0–15.0)
Hemoglobin: 9.5 g/dL — ABNORMAL LOW (ref 12.0–15.0)
MCH: 24.4 pg — ABNORMAL LOW (ref 26.0–34.0)
MCH: 24.5 pg — ABNORMAL LOW (ref 26.0–34.0)
MCHC: 32.2 g/dL (ref 30.0–36.0)
MCHC: 32.5 g/dL (ref 30.0–36.0)
MCV: 75.8 fL — ABNORMAL LOW (ref 80.0–100.0)
MCV: 75.3 fL — ABNORMAL LOW (ref 80.0–100.0)
Platelets: 215 10*3/uL (ref 150–400)
Platelets: 207 10*3/uL (ref 150–400)
RBC: 4.01 MIL/uL (ref 3.87–5.11)
RBC: 3.88 MIL/uL (ref 3.87–5.11)
RDW: 14.8 % (ref 11.5–15.5)
RDW: 14.9 % (ref 11.5–15.5)
WBC: 10.1 10*3/uL (ref 4.0–10.5)
WBC: 10 10*3/uL (ref 4.0–10.5)
nRBC: 0 % (ref 0.0–0.2)
nRBC: 0 % (ref 0.0–0.2)

## 2023-03-06 LAB — RPR: RPR Ser Ql: NONREACTIVE

## 2023-03-06 LAB — GLUCOSE, CAPILLARY
Glucose-Capillary: 75 mg/dL (ref 70–99)
Glucose-Capillary: 80 mg/dL (ref 70–99)
Glucose-Capillary: 74 mg/dL (ref 70–99)
Glucose-Capillary: 59 mg/dL — ABNORMAL LOW (ref 70–99)
Glucose-Capillary: 53 mg/dL — ABNORMAL LOW (ref 70–99)
Glucose-Capillary: 63 mg/dL — ABNORMAL LOW (ref 70–99)
Glucose-Capillary: 85 mg/dL (ref 70–99)

## 2023-03-06 MED ORDER — FENTANYL-BUPIVACAINE-NACL 0.5-0.125-0.9 MG/250ML-% EP SOLN
12.0000 mL/h | EPIDURAL | Status: DC | PRN
  Administered 2023-03-06: 12 mL/h via EPIDURAL
  Filled 2023-03-06: qty 250

## 2023-03-06 MED ORDER — DIPHENHYDRAMINE HCL 50 MG/ML IJ SOLN: 12.5000 mg | INTRAMUSCULAR | Status: DC | PRN

## 2023-03-06 MED ORDER — LIDOCAINE-EPINEPHRINE (PF) 2 %-1:200000 IJ SOLN
INTRAMUSCULAR | Status: AC
  Filled 2023-03-06: qty 20

## 2023-03-06 MED ORDER — DIPHENHYDRAMINE HCL 25 MG PO CAPS: 25.0000 mg | ORAL_CAPSULE | Freq: Four times a day (QID) | ORAL | Status: DC | PRN

## 2023-03-06 MED ORDER — ONDANSETRON HCL 4 MG/2ML IJ SOLN: 4.0000 mg | INTRAMUSCULAR | Status: DC | PRN

## 2023-03-06 MED ORDER — EPHEDRINE 5 MG/ML INJ: 10.0000 mg | INTRAVENOUS | Status: DC | PRN

## 2023-03-06 MED ORDER — FUROSEMIDE 20 MG PO TABS
20.0000 mg | ORAL_TABLET | Freq: Every day | ORAL | Status: DC
  Administered 2023-03-06 – 2023-03-08 (×4): 20 mg via ORAL
  Filled 2023-03-06 (×3): qty 1

## 2023-03-06 MED ORDER — DEXAMETHASONE SODIUM PHOSPHATE 10 MG/ML IJ SOLN
INTRAMUSCULAR | Status: AC
  Filled 2023-03-06: qty 1

## 2023-03-06 MED ORDER — LACTATED RINGERS AMNIOINFUSION: INTRAVENOUS | Status: DC

## 2023-03-06 MED ORDER — ONDANSETRON HCL 4 MG/2ML IJ SOLN
INTRAMUSCULAR | Status: AC
  Filled 2023-03-06: qty 2

## 2023-03-06 MED ORDER — SENNOSIDES-DOCUSATE SODIUM 8.6-50 MG PO TABS
2.0000 | ORAL_TABLET | Freq: Every day | ORAL | Status: DC
  Administered 2023-03-07 – 2023-03-08 (×2): 2 via ORAL
  Filled 2023-03-06 (×2): qty 2

## 2023-03-06 MED ORDER — WITCH HAZEL-GLYCERIN EX PADS: 1.0000 | MEDICATED_PAD | CUTANEOUS | Status: DC | PRN

## 2023-03-06 MED ORDER — IBUPROFEN 600 MG PO TABS
600.0000 mg | ORAL_TABLET | Freq: Four times a day (QID) | ORAL | Status: DC
  Administered 2023-03-07 – 2023-03-08 (×6): 600 mg via ORAL
  Filled 2023-03-06 (×6): qty 1

## 2023-03-06 MED ORDER — COCONUT OIL OIL: 1.0000 | TOPICAL_OIL | Status: DC | PRN

## 2023-03-06 MED ORDER — ZOLPIDEM TARTRATE 5 MG PO TABS: 5.0000 mg | ORAL_TABLET | Freq: Every evening | ORAL | Status: DC | PRN

## 2023-03-06 MED ORDER — SIMETHICONE 80 MG PO CHEW: 80.0000 mg | CHEWABLE_TABLET | ORAL | Status: DC | PRN

## 2023-03-06 MED ORDER — DIBUCAINE (PERIANAL) 1 % EX OINT: 1.0000 | TOPICAL_OINTMENT | CUTANEOUS | Status: DC | PRN

## 2023-03-06 MED ORDER — PRENATAL MULTIVITAMIN CH
1.0000 | ORAL_TABLET | Freq: Every day | ORAL | Status: DC
  Administered 2023-03-07 – 2023-03-08 (×2): 1 via ORAL
  Filled 2023-03-06 (×2): qty 1

## 2023-03-06 MED ORDER — PHENYLEPHRINE 80 MCG/ML (10ML) SYRINGE FOR IV PUSH (FOR BLOOD PRESSURE SUPPORT): 80.0000 ug | PREFILLED_SYRINGE | INTRAVENOUS | Status: DC | PRN

## 2023-03-06 MED ORDER — BENZOCAINE-MENTHOL 20-0.5 % EX AERO: 1.0000 | INHALATION_SPRAY | CUTANEOUS | Status: DC | PRN

## 2023-03-06 MED ORDER — ONDANSETRON HCL 4 MG PO TABS: 4.0000 mg | ORAL_TABLET | ORAL | Status: DC | PRN

## 2023-03-06 MED ORDER — LACTATED RINGERS IV SOLN
500.0000 mL | Freq: Once | INTRAVENOUS | Status: AC
  Administered 2023-03-06: 500 mL via INTRAVENOUS

## 2023-03-06 MED ORDER — LIDOCAINE HCL (PF) 1 % IJ SOLN
INTRAMUSCULAR | Status: DC | PRN
  Administered 2023-03-06: 10 mL via EPIDURAL

## 2023-03-06 NOTE — Discharge Summary (Shared)
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 yo [id number] at [redacted]w[redacted]d was admitted to the hospital 03/05/2023 for induction of labor.  Indication for induction: Preeclampsia and FGR .  Patient had an labor course that was complicated by hypoglycemia of 59 that improved with juice.  Membrane Rupture Time/Date: 11:04 AM ,03/06/2023   Delivery Method:Vaginal, Spontaneous  Episiotomy: None  Lacerations:  1st degree;Perineal  Details of delivery can be found in separate delivery note.  Patient had a postpartum course complicated by mild anemia, for which she was started on PO iron. Patient is discharged home 03/08/23.  Newborn Data: Birth date:03/06/2023  Birth time:6:45 PM  Gender:Female  Living status:Living  Apgars:9 ,9  Weight:2820 g   Magnesium Sulfate received: No BMZ received: No Rhophylac:N/A MMR:N/A, Rubella immune T-DaP:Given prenatally Flu: N/A Transfusion:No  Physical exam  Vitals:   03/07/23 0817 03/07/23  1501 03/07/23 2143 03/08/23 0558  BP: 122/83 137/85 124/87 117/84  Pulse: 62 70 72 60  Resp: 17  17 17   Temp: (!) 97.5 F (36.4 C)  98.2 F (36.8 C) 97.6 F (36.4 C)  TempSrc: Oral  Oral Oral  SpO2:   100% 100%  Weight:      Height:       General: alert, cooperative, and no distress Lochia: appropriate Uterine Fundus: firm Incision: N/A DVT Evaluation: No evidence of DVT seen on physical exam. Lower extremities: tense pitting edema 2+ noted.  Labs: Lab Results  Component Value Date   WBC 9.0 03/07/2023   HGB 9.3 (L) 03/07/2023   HCT 30.1 (L) 03/07/2023   MCV 77.0 (L) 03/07/2023   PLT 224 03/07/2023      Latest Ref Rng & Units 03/05/2023    5:57 PM  CMP  Glucose 70 - 99 mg/dL 136   BUN 6 - 20 mg/dL 6   Creatinine 0.44 - 1.00 mg/dL 0.64   Sodium 135 - 145 mmol/L 134   Potassium 3.5 - 5.1 mmol/L 3.7   Chloride 98 - 111 mmol/L 100   CO2 22 - 32 mmol/L 21   Calcium 8.9 - 10.3 mg/dL 8.6   Total Protein 6.5 - 8.1 g/dL 5.8   Total Bilirubin 0.3 - 1.2 mg/dL 0.4   Alkaline Phos 38 - 126 U/L 156   AST 15 - 41 U/L 36   ALT 0 - 44 U/L 43    Edinburgh Score:    03/08/2023  5:58 AM  Edinburgh Postnatal Depression Scale Screening Tool  I have been able to laugh and see the funny side of things. 0  I have looked forward with enjoyment to things. 0  I have blamed myself unnecessarily when things went wrong. 2  I have been anxious or worried for no good reason. 2  I have felt scared or panicky for no good reason. 2  Things have been getting on top of me. 1  I have been so unhappy that I have had difficulty sleeping. 0  I have felt sad or miserable. 1  I have been so unhappy that I have been crying. 0  The thought of harming myself has occurred to me. 0  Edinburgh Postnatal Depression Scale Total 8     After visit meds:  Allergies as of 03/08/2023       Reactions   Banana Shortness Of Breath, Swelling, Rash   Tylenol [acetaminophen] Hives   Aspirin Rash         Medication List     STOP taking these medications    Accu-Chek Guide Me w/Device Kit   Accu-Chek Guide test strip Generic drug: glucose blood   Accu-Chek Softclix Lancets lancets       TAKE these medications    albuterol 108 (90 Base) MCG/ACT inhaler Commonly known as: VENTOLIN HFA Inhale 2 puffs into the lungs every 6 (six) hours as needed for wheezing or shortness of breath.   buprenorphine 8 MG Subl SL tablet Commonly known as: SUBUTEX Place 4 mg under the tongue 2 (two) times daily.   ferrous sulfate 325 (65 FE) MG tablet Take 1 tablet (325 mg total) by mouth every other day.   furosemide 20 MG tablet Commonly known as: LASIX Take 1 tablet (20 mg total) by mouth daily for 5 days.   ibuprofen 600 MG tablet Commonly known as: ADVIL Take 1 tablet (600 mg total) by mouth every 8 (eight) hours as needed.   PRENATAL VITAMIN PO Take 1 tablet by mouth daily.   senna-docusate 8.6-50 MG tablet Commonly known as: Senokot-S Take 2 tablets by mouth daily as needed for mild constipation.         Discharge home in stable condition Infant Feeding: Bottle and Breast Infant Disposition: NICU Discharge instruction: per After Visit Summary and Postpartum booklet. Activity: Advance as tolerated. Pelvic rest for 6 weeks.  Diet: routine diet Future Appointments: Future Appointments  Date Time Provider [place]  03/14/2023  2:10 PM Myrtis Ser, CNM [facility]  04/10/2023  2:10 PM Roma Schanz, CNM [facility]   Follow up Visit:  Message sent to [facility] by Autry-Lott on 3.26.2024 Message resent 03/08/2023 at discharge.  Please schedule this patient for a In person postpartum visit in 6 weeks with the following provider: MD and APP. Additional Postpartum F/U:Postpartum Depression checkup, 2 hour GTT, BP check 1 week   High risk pregnancy complicated by: GDM and preeclampsia, FGR, OUD Delivery mode:  Vaginal, Spontaneous  Anticipated Birth Control:   depo shot.  Liliane Channel MD MPH OB Fellow, [place] for [place] 03/08/2023

## 2023-03-06 NOTE — Progress Notes (Signed)
Hypoglycemic Event  CBG: 63  Treatment: 4 oz juice/soda  Symptoms: None  Follow-up CBG: Time:1715 CBG Result:pending  Possible Reasons for Event: Other: clear liquid diet for labor, patient states she does not prefer  Comments/MD notified:Dr. [place]

## 2023-03-06 NOTE — Progress Notes (Addendum)
Labor Progress Note  Mylan ESTELL STUMPP is a 27 y.o. G3P1011 at [redacted]w[redacted]d presented for IOL for FGR, PreE w/o SF, and A1GDM.  S: She is doing well, feeling contractions more strongly but not to the point where she would want an epidural. She has been trying to get some sleep.  O:  BP (!) 114/59   Pulse 81   Temp 98.1 F (36.7 C) (Oral)   Resp 16   Ht 4\' 11"  (1.499 m)   Wt 86.6 kg   LMP 06/24/2022 (Approximate)   BMI 38.58 kg/m  EFM: 130bpm/Moderate variability/ 15x15 accels/ Variable decels  CVE: Dilation: 4 Effacement (%): 60 Station: -2 Presentation: Vertex Exam by:: [place]ique Turner   A&P: 27 y.o. [id number] [redacted]w[redacted]d  here for IOL as above  #Labor: Progressing well. Currently on 16 pitocin. Plan to check in about an hour. Will proceed with epidural and AROM from there.  #Pain: Epidural per patient preference. #FWB: CAT 1 #GBS positive PCN  A1GDM CBG 153 >98>75 -start endotool if >120    PreE w/o SF BP wnl.  -Continue to monitor   OUD -Continue subutex 4 mg BID  Enid Baas, MD FMOB Fellow, Faculty practice [place] for [facility] 03/06/23  3:00 AM

## 2023-03-06 NOTE — Progress Notes (Addendum)
Delivery Note Andrea [name] is a 27 y.o. G3P1011 at [redacted]w[redacted]d admitted for IOL for PreE wo SF, FGR, .   GBS Status:  Positive/-- (03/19 1400) Maximum Maternal Temperature: 98.32F  Labor course: Initial SVE: 3/50/-2. Augmentation with: AROM, Pitocin, Cytotec, and IP Foley. She then progressed to complete.  ROM: 7h 70m with clear fluid  Birth: At [place] a viable female was delivered via spontaneous vaginal delivery (Presentation: DOA with compound left hand ;  ). Nuchal cord present: Yes tight nuchal x2.  Shoulders and body delivered in usual fashion. Infant placed directly on mom's abdomen for bonding/skin-to-skin, baby dried and stimulated. Cord clamped x 2 after 1 minute and cut by FOB.  Cord blood collected.  The placenta separated spontaneously and delivered via gentle cord traction.  Pitocin infused rapidly IV per protocol.  Fundus firm with massage.  Placenta inspected and appears to be intact with a 3 VC.  Placenta/Cord with the following complications: none . Sponge and instrument count were correct x2.  Intrapartum complications:  None Anesthesia:  epidural Episiotomy: none Lacerations:  1st degree perinium Suture Repair: 3.0 vicryl EBL (mL): 75cc   Infant: APGAR (1 MIN): 9   APGAR (5 MINS): 9   APGAR (10 MINS):    Infant weight: pending  Mom to postpartum.  Baby to Couplet care / Skin to Skin. Placenta to L&D   Plans to Breast and bottlefeed Contraception: tubal ligation postpartum Circumcision: N/A  Delivery was attended by Dr. Naaman Plummer Autry-Lott. [name], M.D. PGY-2 Family Medicine Visiting Resident Faculty Practice 03/06/2023 7:07 PM   GME ATTESTATION:  I saw and evaluated the patient. I agree with the findings and the plan of care as documented in the resident's note. I have made changes to documentation as necessary.  Gerlene Fee, DO OB Fellow, [place] for [place] 03/06/2023, 8:19 PM

## 2023-03-06 NOTE — Progress Notes (Signed)
Hypoglycemic Event  CBG: 59  Treatment: 4 oz juice/soda  Symptoms: None  Follow-up CBG: Time:1635 CBG Result:pending  Possible Reasons for Event: Inadequate meal intake and Other: clear liquid diet for labor, patient does not prefer  Comments/MD notified:Dr. Autry-Lott    Valerie Salts

## 2023-03-06 NOTE — Progress Notes (Signed)
Labor Progress Note  Andrea [name] is a 27 y.o. G3P1011 at [redacted]w[redacted]d presented for IOL for FGR, PreE w/o SF, and A1GDM.  S: just got epidural. Feeling more comforttable  O:  BP 119/81   Pulse 93   Temp 97.8 F (36.6 C) (Oral)   Resp 16   Ht 4\' 11"  (1.499 m)   Wt 86.6 kg   LMP 06/24/2022 (Approximate)   SpO2 100%   BMI 38.58 kg/m  EFM: 130bpm/Moderate variability/ 15x15 accels present / no decels. Cat 1   CVE: Dilation: 4 Effacement (%): 70 Station: -1 Presentation: Vertex Exam by:: B Stringer RN   A&P: 27 y.o. [id number] [redacted]w[redacted]d  here for IOL as above  #Labor:  Currently on 26 pitocin. Plan for AROM after foley catheter placement. #Pain: planning Epidural   #FWB: CAT 1 #GBS positive PCN  A1GDM CBG 153 on admission, has been < 100 overnight. - continue CBG q4   PreE w/o SF BP wnl.  - last serum labs 3/25 Cr 0.64, LFT wnl. CBC 3/26 witih plt 207 and hgb 9.5 - no long acting medication -Continue to monitor   OUD -Continue subutex 4 mg BID  Seen with Dr. Naaman Plummer Autry-Lott. Andrea [name] "[name], M.D. PGY-2 Family Medicine Visiting Resident Faculty Practice 03/06/2023 9:59 AM

## 2023-03-06 NOTE — Progress Notes (Signed)
Labor Progress Note  Andrea [name] is a 27 y.o. G3P1011 at [redacted]w[redacted]d presented for IOL for FGR, PreE w/o SF, and A1GDM.  S: fhas been hypoglycemic. Feeling some pressure in the anterior abdomen with contractions  O:  BP 90/69   Pulse (!) 114   Temp 98.1 F (36.7 C) (Oral)   Resp 16   Ht 4\' 11"  (1.499 m)   Wt 86.6 kg   LMP 06/24/2022 (Approximate)   SpO2 100%   BMI 38.58 kg/m  EFM: baseline 135, mod variability,  recurrent late decels with preservation of mod variability and recovery prior to cervical exam. Now having recurrent late decels with quick recovery to baseline and preservation of mod variability in between con tractions Toco: q 5 min  CVE: complete and at 0 station. by Dr. Janus Molder   A&P: 27 y.o. [id number] [redacted]w[redacted]d  here for IOL as above  #Labor:  AROM at 1100 to clear. Pit has restarted. Practice pushes has made some progress but she got a bit light headeded. Having decels with contraction however rercovers to reassuring tracing in between. Plan to correct hypoglycemia and labor down before proceeding to pushing  #Pain: s/p Epidural   #FWB: CAT 2  #GBS positive PCN  A1GDM CBG 153 on admission, has been < 100 overnight. - continue CBG q4 - currently hypoglycemic. Correcting with juice. Will give D50 if still low.    PreE w/o SF BP wnl.  - last serum labs 3/25 Cr 0.64, LFT wnl. CBC 3/26 witih plt 207 and hgb 9.5 - no long acting medication -Continue to monitor   OUD -Continue subutex 4 mg BID  Seen with Dr. Naaman Plummer [name]. Andrea [name] "Andrea [name], M.D. PGY-2 Family Medicine Visiting Resident Faculty Practice 03/06/2023 5:26 PM

## 2023-03-06 NOTE — Progress Notes (Signed)
Hypoglycemic Event  CBG: 53  Treatment: 4 oz juice/soda  Symptoms: None  Follow-up CBG: [id number] CBG Result:pending  Possible Reasons for Event: Other: clear liquid diet for labor, patient states she does not prefer  Comments/MD notified:Dr. Autry-Lott    Valerie Salts

## 2023-03-06 NOTE — Anesthesia Preprocedure Evaluation (Signed)
Anesthesia Evaluation  Patient identified by MRN, date of birth, ID band Patient awake    Reviewed: Allergy & Precautions, H&P , Patient's Chart, lab work & pertinent test results  Airway Mallampati: II       Dental no notable dental hx.    Pulmonary neg pulmonary ROS, Patient abstained from smoking., former smoker   Pulmonary exam normal        Cardiovascular hypertension, Normal cardiovascular exam     Neuro/Psych  Headaches, Seizures -,  PSYCHIATRIC DISORDERS Anxiety Depression       GI/Hepatic negative GI ROS, Neg liver ROS,,,  Endo/Other  negative endocrine ROSdiabetes, Gestational    Renal/GU negative Renal ROS     Musculoskeletal   Abdominal  (+) + obese  Peds  Hematology negative hematology ROS (+)   Anesthesia Other Findings   Reproductive/Obstetrics (+) Pregnancy                             Anesthesia Physical Anesthesia Plan  ASA: 3  Anesthesia Plan: Epidural   Post-op Pain Management:    Induction:   PONV Risk Score and Plan:   Airway Management Planned:   Additional Equipment:   Intra-op Plan:   Post-operative Plan:   Informed Consent: I have reviewed the patients History and Physical, chart, labs and discussed the procedure including the risks, benefits and alternatives for the proposed anesthesia with the patient or authorized representative who has indicated his/her understanding and acceptance.       Plan Discussed with:   Anesthesia Plan Comments:        Anesthesia Quick Evaluation

## 2023-03-06 NOTE — Plan of Care (Signed)
Completed and adequate for discharge

## 2023-03-06 NOTE — Progress Notes (Signed)
Labor Progress Note  Andrea [name] is a 27 y.o. G3P1011 at [redacted]w[redacted]d presented for IOL for FGR, PreE w/o SF, and A1GDM.  S: no complaints  O:  BP 130/82   Pulse (!) 105   Temp 97.8 F (36.6 C) (Oral)   Resp 16   Ht 4\' 11"  (1.499 m)   Wt 86.6 kg   LMP 06/24/2022 (Approximate)   SpO2 100%   BMI 38.58 kg/m  EFM: 130bpm/Moderate variability/ 15x15 accels present / no decels. Cat 1   CVE by Dr. Janus Molder - 4.5/70/-1, unchanged from prior   A&P: 27 y.o. G3P1011 [redacted]w[redacted]d  here for IOL as above  #Labor:  Currently on 26 pitocin. AROM'd to clear by Dr. Janus Molder #Pain: s/p Epidural   #FWB: CAT 1 #GBS positive PCN  A1GDM CBG 153 on admission, has been < 100 overnight. - continue CBG q4   PreE w/o SF BP wnl.  - last serum labs 3/25 Cr 0.64, LFT wnl. CBC 3/26 witih plt 207 and hgb 9.5 - no long acting medication -Continue to monitor   OUD -Continue subutex 4 mg BID  Seen with Dr. Naaman Plummer Autry-Lott. Andrea [name] "[name], M.D. PGY-2 Family Medicine Visiting Resident Faculty Practice 03/06/2023 11:04 AM

## 2023-03-06 NOTE — Anesthesia Procedure Notes (Signed)
Epidural Patient location during procedure: OB Start time: 03/06/2023 9:20 AM End time: 03/06/2023 9:26 AM  Staffing Anesthesiologist: Lyn Hollingshead, MD Performed: anesthesiologist   Preanesthetic Checklist Completed: patient identified, IV checked, site marked, risks and benefits discussed, surgical consent, monitors and equipment checked, pre-op evaluation and timeout performed  Epidural Patient position: sitting Prep: DuraPrep and site prepped and draped Patient monitoring: continuous pulse ox and blood pressure Approach: midline Location: L3-L4 Injection technique: LOR air  Needle:  Needle type: Tuohy  Needle gauge: 17 G Needle length: 9 cm and 9 Needle insertion depth: 6 cm Catheter type: closed end flexible Catheter size: 19 Gauge Catheter at skin depth: 11 cm Test dose: negative and Other  Assessment Events: blood not aspirated, no cerebrospinal fluid, injection not painful, no injection resistance, no paresthesia and negative IV test  Additional Notes Reason for block:procedure for pain

## 2023-03-06 NOTE — Progress Notes (Signed)
Labor Progress Note  Andrea [name] is a 27 y.o. G3P1011 at [redacted]w[redacted]d presented for IOL for FGR, PreE w/o SF, and A1GDM.  S: feeling lots of pressure in the anterior lower abd   O:  BP 116/71   Pulse 85   Temp 97.8 F (36.6 C) (Oral)   Resp 16   Ht 4\' 11"  (1.499 m)   Wt 86.6 kg   LMP 06/24/2022 (Approximate)   SpO2 100%   BMI 38.58 kg/m  EFM: 140bpm/Moderate variability/ no accels / recurrent late deceleration with recovery, intermittent variable deceleration. Returns to baseline with good variability in between contracitons  CVE: 9cn / 0 station   A&P: 27 y.o. G3P1011 [redacted]w[redacted]d  here for IOL as above  #Labor:  AROM at 1100 to clear. Pit stopped. Plan to monitor and restart pit as tolerated #Pain: s/p Epidural   #FWB: CAT 2  #GBS positive PCN  A1GDM CBG 153 on admission, has been < 100 overnight. - continue CBG q4   PreE w/o SF BP wnl.  - last serum labs 3/25 Cr 0.64, LFT wnl. CBC 3/26 witih plt 207 and hgb 9.5 - no long acting medication -Continue to monitor   OUD -Continue subutex 4 mg BID  Seen with Dr. Naaman Plummer Autry-Lott. Andrea [name] "[name], M.D. PGY-2 Family Medicine Visiting Resident Faculty Practice 03/06/2023 2:15 PM

## 2023-03-07 NOTE — Anesthesia Postprocedure Evaluation (Signed)
Anesthesia Post Note  Patient: Andrea [name]  Procedure(s) Performed: AN AD HOC LABOR EPIDURAL     Patient location during evaluation: Mother Baby Anesthesia Type: Epidural Level of consciousness: awake and alert Pain management: pain level controlled Vital Signs Assessment: post-procedure vital signs reviewed and stable Respiratory status: spontaneous breathing, nonlabored ventilation and respiratory function stable Cardiovascular status: stable Postop Assessment: no headache, no backache and epidural receding Anesthetic complications: no  No notable events documented.  Last Vitals:  Vitals:   03/07/23 0331 03/07/23 0817  BP: (!) 131/94 122/83  Pulse: 70 62  Resp: 20 17  Temp: 36.6 C (!) 36.4 C  SpO2: 100%     Last Pain:  Vitals:   03/07/23 0817  TempSrc: Oral  PainSc:    Pain Goal:                   [name]

## 2023-03-07 NOTE — Lactation Note (Signed)
This note was copied from a baby's chart.  NICU Lactation Consultation Note  Patient Name: Andrea [name] [id number] Date: 03/07/2023 Age:27 hours  Reason for consult: Initial assessment; NICU baby; Early term 37-38.6wks; Other (Comment) (Hx of SUD)  Subjective Visited with family of 55 hours old ETI NICU female; Andrea [name] is a P2 but not very experienced breastfeeding. She's already pumping and using her wearable pump from home; set her up with a hospital grade DEBP per her request, provided cleaning supplies, bottles, and NICU RN will print labels. Flanges # 21 are appropriate at this time; she'll be using the # 21 adapters for her wearable pump. Reviewed pumping schedule, lactogenesis II and anticipatory guidelines.   Objective Infant data: Mother's Current Feeding Choice: Breast Milk and Formula  Infant feeding assessment Scale for Readiness: 2 Scale for Quality: 3  Maternal data: [id number]  Vaginal, Spontaneous Has patient been taught Hand Expression?: Yes Hand Expression Comments: colostum noted Significant Breast History:: moderate breast changes during the pregnancy; she started leaking at 32-33 weeks Current breast feeding challenges:: NICU admission Previous breastfeeding challenges?: Other (Comment); Low milk supply (difficult latch) Does the patient have breastfeeding experience prior to this delivery?: Yes How long did the patient breastfeed?: 1 1/2 months Pumping frequency: initiated pumping with her own wearable pump from home at 6 hours post-partum Pumped volume: 1 mL Flange Size: 21 Risk factor for low milk supply:: infant separation  Pump: Personal (Wearable DEBP at home)  Assessment Infant: Feeding Status: Scheduled 9-12-3-6  Maternal: Milk volume: Normal  Intervention/Plan Interventions: Interventions: Breast feeding basics reviewed; DEBP; Education; [employer] brochure; Coconut oil  Tools: Pump; Flanges; Coconut oil Pump Education: Setup,  frequency, and cleaning; Milk Storage  Plan: Encouraged pumping every 3 hours, ideally 8 pumping sessions/24 hours Breast massage, hand expression and coconut oil were also encouraged prior pumping  FOB present. All questions and concerns answered, family to contact [facility] services PRN.  Consult Status: NICU follow-up  NICU Follow-up type: New admission follow up; Maternal D/C visit   Andrea [name] Andrea [name] 03/07/2023, 1:17 PM

## 2023-03-07 NOTE — Progress Notes (Signed)
Pt complaining about IV and is requesting for it to be removed. This nurse educated pt about tubal on 3/38/24 @11  am and would have to be stuck again. Pt stated she understood, but this IV is giving her a hard time. No redness or swelling was noticed by this nurse at this time.

## 2023-03-07 NOTE — Progress Notes (Addendum)
POSTPARTUM PROGRESS NOTE  Post Partum Day 1  Subjective:  Andrea [name] is a 27 y.o. [id number] s/p SVD at [redacted]w[redacted]d.  She reports she is doing well. No acute events overnight. She denies any problems with ambulating, voiding or po intake. Pain is moderately controlled.  Lochia is appropriate. She desires tubal, but is really hungry and would like to eat if surgery is not going to happen this morning. Also wants to meet with lactation to discuss latching.  Objective: Blood pressure (!) 131/94, pulse 70, temperature 97.8 F (36.6 C), temperature source Oral, resp. rate 20, height 4\' 11"  (1.499 m), weight 86.6 kg, last menstrual period 06/24/2022, SpO2 100 %, unknown if currently breastfeeding.  Physical Exam:  General: alert, cooperative and no distress Chest: no respiratory distress Heart:regular rate, distal pulses intact Abdomen: soft, Uterine Fundus: appropriately tender DVT Evaluation: No calf swelling or tenderness Extremities: mild edema Skin: warm, dry  Recent Labs    03/06/23 0058 03/06/23 0633  HGB 9.8* 9.5*  HCT 30.4* 29.2*    Assessment/Plan: Andrea [name] is a 27 y.o. [id number] s/p SVD at [redacted]w[redacted]d   PPD#1 - Doing well  Routine postpartum care, can consider extra dose of buprenorphine for breakthrough pain.  A1GDM CBGs have been appropriate. - recheck GTT 6 weeks postpartum   PreE w/o SF BP wnl.  - last serum labs 3/25 Cr 0.64, LFT wnl. CBC 3/26 witih plt 207 and hgb 9.5 - lasix 20mg  daily  Contraception: BTL Feeding: both Dispo: Plan for discharge after tubal ligation.   LOS: 2 days   Enid Baas, MD Family Medicine, PGY1 03/07/2023, 5:52 AM  Fellow Attestation  I saw and evaluated the patient, performing the key elements of the service.I  personally performed or re-performed the history, physical exam, and medical decision making activities of this service and have verified that the service and findings are accurately documented in the resident's  note. I developed the management plan that is described in the resident's note, and I agree with the content, with my edits above.    Gifford Shave, MD OB Fellow

## 2023-03-08 ENCOUNTER — Encounter (HOSPITAL_COMMUNITY)
Admission: RE | Disposition: A | Discharge status: Home / Self Care | Payer: Self-pay | Source: Home / Self Care | Attending: Family Medicine

## 2023-03-08 ENCOUNTER — Other Ambulatory Visit (HOSPITAL_COMMUNITY): Payer: Self-pay

## 2023-03-08 ENCOUNTER — Encounter (HOSPITAL_COMMUNITY): Payer: Self-pay | Admitting: Obstetrics & Gynecology

## 2023-03-08 ENCOUNTER — Other Ambulatory Visit: Payer: Medicaid Other

## 2023-03-08 ENCOUNTER — Ambulatory Visit (HOSPITAL_COMMUNITY): Payer: Self-pay

## 2023-03-08 DIAGNOSIS — D62 Acute posthemorrhagic anemia: Secondary | ICD-10-CM | POA: Insufficient documentation

## 2023-03-08 LAB — CBC
HCT: 30.1 % — ABNORMAL LOW (ref 36.0–46.0)
Hemoglobin: 9.3 g/dL — ABNORMAL LOW (ref 12.0–15.0)
MCH: 23.8 pg — ABNORMAL LOW (ref 26.0–34.0)
MCHC: 30.9 g/dL (ref 30.0–36.0)
MCV: 77 fL — ABNORMAL LOW (ref 80.0–100.0)
Platelets: 224 10*3/uL (ref 150–400)
RBC: 3.91 MIL/uL (ref 3.87–5.11)
RDW: 14.9 % (ref 11.5–15.5)
WBC: 9 10*3/uL (ref 4.0–10.5)
nRBC: 0 % (ref 0.0–0.2)

## 2023-03-08 SURGERY — LIGATION, FALLOPIAN TUBE, POSTPARTUM
Anesthesia: Choice | Laterality: Bilateral

## 2023-03-08 MED ORDER — FUROSEMIDE 20 MG PO TABS
20.0000 mg | ORAL_TABLET | Freq: Every day | ORAL | 0 refills | Status: DC
  Filled 2023-03-08: qty 5, 5d supply, fill #0

## 2023-03-08 MED ORDER — FERROUS SULFATE 325 (65 FE) MG PO TABS
325.0000 mg | ORAL_TABLET | ORAL | Status: DC
  Administered 2023-03-08: 325 mg via ORAL
  Filled 2023-03-08: qty 1

## 2023-03-08 MED ORDER — SENNOSIDES-DOCUSATE SODIUM 8.6-50 MG PO TABS
2.0000 | ORAL_TABLET | Freq: Every day | ORAL | 0 refills | Status: DC | PRN
  Filled 2023-03-08: qty 30, 15d supply, fill #0

## 2023-03-08 MED ORDER — IBUPROFEN 600 MG PO TABS
600.0000 mg | ORAL_TABLET | Freq: Three times a day (TID) | ORAL | 0 refills | Status: DC | PRN
  Filled 2023-03-08: qty 30, 10d supply, fill #0

## 2023-03-08 MED ORDER — MEDROXYPROGESTERONE ACETATE 150 MG/ML IM SUSP
150.0000 mg | Freq: Once | INTRAMUSCULAR | Status: AC
  Administered 2023-03-08: 150 mg via INTRAMUSCULAR
  Filled 2023-03-08: qty 1

## 2023-03-08 MED ORDER — FERROUS SULFATE 325 (65 FE) MG PO TABS
325.0000 mg | ORAL_TABLET | ORAL | 0 refills | Status: DC
  Filled 2023-03-08: qty 15, 30d supply, fill #0

## 2023-03-08 NOTE — Progress Notes (Addendum)
Pt. disclosed to this RN that she no longer wants BTL .  Simone Autry-Lott, DO discussed decision with patient at bedside to get conformation. CBC was ordered and resulted and nurse from L&D pulled pt. Epidural

## 2023-03-08 NOTE — Discharge Instructions (Addendum)
-   Continue your prenatal vitamins especially if breastfeeding - Try to eat iron rich food. - Take iron supplement as prescribed every other day. Take along with fruit or orange juice, avoid taking within 30 mins of eating dairy (milk, cheese) - Take over the counter tylenol (500mg ) or ibuprofen (200mg ) three times a day as needed for cramping/pain. - Take water pill (lasix) as prescribed for 5 days. - follow up in clinic in 1 week for a blood pressure check and in 4-6 weeks as scheduled for your regular post partum visit. - Please come back to [facility] if you notice persistently elevated blood pressures or you start to have a headache, that doesn't get better with medications (tylenol and ibuprofen), rest (4hrs of sleep) and drinking water.

## 2023-03-08 NOTE — Clinical Social Work Maternal (Signed)
CLINICAL SOCIAL WORK MATERNAL/CHILD NOTE  Patient Details  Name: Andrea [name] MRN: [id number] Date of Birth: Sep 10, 1996  Date:  03/08/2023  Clinical Social Worker Initiating Note:  Laurey Arrow Date/Time: Initiated:  03/08/23/1118     Child's Name:  Andrea [name]   Biological Parents:  Mother, Father   Need for Interpreter:  None   Reason for Referral:  Behavioral Health Concerns, Current Substance Use/Substance Use During Pregnancy     Address:  [address] [place] [address]    Phone number:  [phone] (home)     Additional phone number: FOB's number is [phone]  Household Members/Support Persons (HM/SP):   Household Member/Support Person 1, Household Member/Support Person 2   HM/SP Name Relationship DOB or Age  HM/SP -1 Madie Reno FOB 08/18/1994  HM/SP -[place] daughter 04/25/2019  HM/SP -3        HM/SP -4        HM/SP -5        HM/SP -6        HM/SP -7        HM/SP -8          Natural Supports (not living in the home):  Immediate Family, Parent, Extended Family (Per MOB, FOB's family will also provide supports.)   Professional Supports: Therapist, Case Metallurgist ([facility] is where MOB receives medication managment and ouptatient therapy.)   Employment: Animator   Type of Work: Engineer, maintenance (IT) at [employer]   Education:  [place] arranged:    Museum/gallery curator Resources:  [employer]   Other Resources:  [employer] (Per MOB, she is not eligible for [employer].)   Cultural/Religious Considerations Which May Impact Care:  Per [employer] Face Sheet MOB is Non-Denominational  Strengths:  Ability to meet basic needs  , Psychotropic Medications, Compliance with medical plan  , Home prepared for child  , Pediatrician chosen, Understanding of illness   Psychotropic Medications:  Subutex      Pediatrician:    [facility]  Pediatrician List:   [place]      Pediatrician Fax Number:    Risk Factors/Current Problems:  Substance Use  , Mental Health Concerns     Cognitive State:      Mood/Affect:  Happy  , Calm  , Bright  , Relaxed  , Interested  , Comfortable     CSW Assessment: CSW met with MOB and FOB at MOB's beside in room 411 to complete an assessment for MH hx and SA hx (medication management). When CSW arrived, MOB was pumping and FOB was relaxing in the recliner; infant is in the NICU. CSW introduced self and explained the need to complete an assessment. MOB and FOB were polite an easy to engage.  FOB remained in the room for a portion of the assessment and with MOB's permission CSW asked FOB to leave in order to be HIPAA compliant with MOB's confidential information towards the later part of the assessment; FOB left without any incidents. MOB was polite and was receptive with meeting with CSW.   CSW asked about MOB's MH hx.  Per MOB she was dx with anxiety around 2016 and communicated that she feels like her anxiety was substance induced. Per MOB, she is not currently taking any medication for her anxiety however, she does attend outpatient  counseling regularly. CSW inquired about MOB's substance use hx. MOB shared that she was a polysubstance user and her withdrawal symptoms are currently being managed with Subutex. MOB reported her last use of any illicit substances was 4.5 years ago. CSW informed MOB of the hospital's drug screen policy. MOB was made aware of the 2 drug screenings for the infant.  MOB was understanding and did not have any concerns.  CSW shared with MOB that the infant's UDS is negative and CSW will continue to monitor infant's CDS and will make a report to [place] if warranted.  MOB did not have any questions about the hospital's policy.  MOB reported having all essential items for infant including a new car seat and bed for infant. MOB  shared feeling prepared for infant's future discharge. MOB also denied barriers to visiting with infant post MOB's discharge. Per MOB, she plans to room in with infant nightly and visit with infant throughout the day.   CSW provided MOB with 7 meal vouchers and reviewed the meal voucher protocol.  CSW also provided MOB with information about the [facility] and resources that can be provided.   CSW thanked MOB for completing assessment.  CSW will continue to offer resources and supports to family while infant remains in NICU.    CSW Plan/Description:  Psychosocial Support and Ongoing Assessment of Needs, Sudden Infant Death Syndrome (SIDS) Education, Perinatal Mood and Anxiety Disorder (PMADs) Education, Neonatal Abstinence Syndrome (NAS) Education, Other Patient/Family Education, [place], Other Information/Referral to [employer], CSW Will Continue to Monitor Umbilical Cord Tissue Drug Screen Results and Make Report if Warranted   Laurey Arrow, MSW, LCSW Clinical Social Work [phone]  Dimple Nanas, LCSW 03/08/2023, 11:23 AM

## 2023-03-08 NOTE — Lactation Note (Signed)
This note was copied from a baby's chart.  NICU Lactation Consultation Note  Patient Name: Andrea [name] [id number] Date: 03/08/2023 Age:27 hours  Reason for consult: Follow-up assessment; NICU baby; Other (Comment); Early term 50-38.6wks; Maternal discharge (Hx of SUD)  Subjective Visited with family of 18 hours old ETI NICU female; Andrea [name] is a P2 and reports she's trying to follow the 3-hour schedule for pumping but it's been challenging. She got discharged today. She voiced she is doing birth control and got her Depo shot this morning. She requested a NS since her plan is to eventually take baby to breast, she used one with her first baby; but baby "Andrea [name]" not cueing at this time. Sized Andrea [name] into a # 16 NS; it seems appropriate at this time. Reviewed discharge education, engorgement prevention/treatment, pumping schedule, lactogenesis II/III and anticipatory guidelines.  Objective Infant data: Mother's Current Feeding Choice: Breast Milk and Formula  Infant feeding assessment Scale for Readiness: 2 Scale for Quality: 3    Maternal data: [id number]  Vaginal, Spontaneous Has patient been taught Hand Expression?: Yes Hand Expression Comments: colostum noted Significant Breast History:: moderate breast changes during the pregnancy; she started leaking at 32-33 weeks Current breast feeding challenges:: NICU admission Previous breastfeeding challenges?: Other (Comment); Low milk supply (difficult latch) Does the patient have breastfeeding experience prior to this delivery?: Yes How long did the patient breastfeed?: 1 1/2 months Pumping frequency: 4 times/24 hours Pumped volume: 5 mL (5-7 ml) Flange Size: 21 Risk factor for low milk supply:: infant separation  Pump: Personal, Hands Free (Wearable pump and Medela DEBP at home)  Assessment Infant: Feeding Status: Scheduled 9-12-3-6  Maternal: Milk volume: Normal Breast are soft, no S/S of engorgement at this  time  Intervention/Plan Interventions: Interventions: Breast feeding basics reviewed; DEBP; Education  Discharge Education: Engorgement and breast care  Tools: Pump; Flanges; Coconut oil; Nipple Shields Pump Education: Setup, frequency, and cleaning; Milk Storage Nipple shield size: 16  Plan: Encouraged pumping every 3 hours, ideally 8 pumping sessions/24 hours She'll bring all pump parts to baby's room after her discharge She'll switch her pump setting from initiation to expression mode once she starts getting 20 ml of EBM combined She'll start taking baby to breast on feeding cues around feeding time using NS # 16 PRN   FOB present. All questions and concerns answered, family to contact [facility] services PRN.  Consult Status: NICU follow-up  NICU Follow-up type: Verify onset of copious milk; Verify absence of engorgement; Assist with IDF-2 (Mother does not need to pre-pump before breastfeeding)   Elvy Mclarty Francene Boyers 03/08/2023, 4:18 PM

## 2023-03-12 ENCOUNTER — Other Ambulatory Visit: Payer: Medicaid Other

## 2023-03-12 ENCOUNTER — Encounter: Payer: Medicaid Other | Admitting: Advanced Practice Midwife

## 2023-03-14 ENCOUNTER — Encounter: Payer: Medicaid Other | Admitting: Advanced Practice Midwife

## 2023-03-15 ENCOUNTER — Other Ambulatory Visit: Payer: Medicaid Other

## 2023-03-15 ENCOUNTER — Telehealth (HOSPITAL_COMMUNITY): Payer: Self-pay

## 2023-03-15 NOTE — Telephone Encounter (Signed)
Preadmission testing 

## 2023-03-17 ENCOUNTER — Telehealth (HOSPITAL_COMMUNITY): Payer: Self-pay | Admitting: *Deleted

## 2023-03-17 NOTE — Telephone Encounter (Signed)
Attempted hospital discharge follow-up call. Left message for patient to return RN call with any questions or concerns. Deforest Hoyles, RN, 03/17/23, [phone]

## 2023-03-19 ENCOUNTER — Other Ambulatory Visit: Payer: Medicaid Other

## 2023-03-19 ENCOUNTER — Encounter: Payer: Medicaid Other | Admitting: Obstetrics & Gynecology

## 2023-04-10 ENCOUNTER — Other Ambulatory Visit: Payer: Medicaid Other

## 2023-04-10 ENCOUNTER — Ambulatory Visit: Payer: Medicaid Other | Admitting: Women's Health

## 2023-04-10 DIAGNOSIS — Z8632 Personal history of gestational diabetes: Secondary | ICD-10-CM

## 2023-04-10 DIAGNOSIS — Z131 Encounter for screening for diabetes mellitus: Secondary | ICD-10-CM

## 2023-04-11 ENCOUNTER — Ambulatory Visit (INDEPENDENT_AMBULATORY_CARE_PROVIDER_SITE_OTHER): Payer: Medicaid Other | Admitting: Advanced Practice Midwife

## 2023-04-11 ENCOUNTER — Encounter: Payer: Self-pay | Admitting: *Deleted

## 2023-04-11 ENCOUNTER — Encounter: Payer: Self-pay | Admitting: Advanced Practice Midwife

## 2023-04-11 DIAGNOSIS — F1121 Opioid dependence, in remission: Secondary | ICD-10-CM | POA: Diagnosis not present

## 2023-04-11 MED ORDER — MEDROXYPROGESTERONE ACETATE 150 MG/ML IM SUSP: 150.0000 mg | INTRAMUSCULAR | 4 refills | Status: DC

## 2023-04-11 NOTE — Progress Notes (Signed)
POSTPARTUM VISIT Patient name: Andrea [name] MRN [id number]  Date of birth: [birth year] Chief Complaint:   Postpartum Care (Talk about up coming surgery)  History of Present Illness:   Andrea [name] is a 27 y.o. [id number] Caucasian female being seen today for a postpartum visit. She is 5 weeks postpartum following a spontaneous vaginal delivery at 38.0 gestational weeks. IOL: yes, for fetal growth restriction  and preeclampsia w/o severe features. Anesthesia: epidural.  Laceration: 1st degree.  Complications: didn't require antihypertensives at d/c. Inpatient contraception: yes Depo 03/08/23 .   Pregnancy complicated by OUD (Suboxone), pre-e without SF, GDMA1, FGR . Tobacco use: former . Substance use disorder: yes former (cocaine, oxy, THC); subutex now . Last pap smear: Nov 2023 and results were NILM w/ HRHPV negative. Next pap smear due: Nov 2026 No LMP recorded.  Postpartum course has been uncomplicated. Bleeding less flow than a normal period. Bowel function is  her normal constipation at times . Bladder function is normal. Urinary incontinence? no, fecal incontinence? no Patient is not sexually active. Last sexual activity: prior to birth of baby. Desired contraception: Depo given at hospital  . Patient does not know about a pregnancy in the future.  Desired family size is unsure number of children.   Upstream - 04/11/23 1057       Pregnancy Intention Screening   Does the patient want to become pregnant in the next year? No    Does the patient's partner want to become pregnant in the next year? No    Would the patient like to discuss contraceptive options today? No      Contraception Wrap Up   Current Method Hormonal Injection    End Method Hormonal Injection    Contraception Counseling Provided No            The pregnancy intention screening data noted above was reviewed. Potential methods of contraception were discussed. The patient elected to proceed with Hormonal  Injection.  Edinburgh Postpartum Depression Screening: positive: H/O mental health disorder: yes depression. Currently on meds: no.  Currently in therapy: yes meets w Delorise Shiner @ [facility] office (addiction tx center).  Sleeping: as expected w newborn.  Appetite: nl.  Still finds joy in things she used to: Yes.  Support at home: yes.  SI/HI/II: no.  Interested in medicine: No.  Interested in therapy: No.  Edinburgh Postnatal Depression Scale - 04/11/23 1058       Edinburgh Postnatal Depression Scale:  In the Past 7 Days   I have been able to laugh and see the funny side of things. 0    I have looked forward with enjoyment to things. 0    I have blamed myself unnecessarily when things went wrong. 1    I have been anxious or worried for no good reason. 1    I have felt scared or panicky for no good reason. 2    Things have been getting on top of me. 0    I have been so unhappy that I have had difficulty sleeping. 0    I have felt sad or miserable. 1    I have been so unhappy that I have been crying. 0    The thought of harming myself has occurred to me. 0    Edinburgh Postnatal Depression Scale Total 5                09/18/2022    2:52 PM 01/27/2022   11:24 AM  GAD  7 : Generalized Anxiety Score  Nervous, Anxious, on Edge 2 1  Control/stop worrying 2 1  Worry too much - different things 2 1  Trouble relaxing 2 1  Restless 2 1  Easily annoyed or irritable 2 1  Afraid - awful might happen 3 1  Total GAD 7 Score 15 7     Baby's course has been uncomplicated. Baby is feeding by bottle. Infant has a pediatrician/family doctor? Yes.  Childcare strategy if returning to work/school: family.  Pt has material needs met for her and baby: Yes.   Review of Systems:   Pertinent items are noted in HPI Denies Abnormal vaginal discharge w/ itching/odor/irritation, headaches, visual changes, shortness of breath, chest pain, abdominal pain, severe nausea/vomiting, or problems with urination or bowel  movements. Pertinent History Reviewed:  Reviewed past medical,surgical, obstetrical and family history.  Reviewed problem list, medications and allergies. OB History  Gravida Para Term Preterm AB Living  3 2 2  0 1 2  SAB IAB Ectopic Multiple Live Births  0 0 0 0 2    # Outcome Date GA Lbr Len/2nd Weight Sex Delivery Anes PTL Lv  3 Term 03/06/23 [redacted]w[redacted]d 04:55 / 01:50 6 lb 3.5 oz (2.82 kg) F Vag-Spont EPI  LIV     Birth Comments: wnl  2 AB 02/24/22 [redacted]w[redacted]d   F    FD  1 Term 04/25/19 [redacted]w[redacted]d 04:15 / 04:20 6 lb 3.5 oz (2.82 kg) F Vag-Vacuum EPI N LIV     Complications: Gestational hypertension   Physical Assessment:   Vitals:   04/11/23 1048  BP: 121/79  Pulse: 70  Weight: 168 lb 6.4 oz (76.4 kg)  Height: 4\' 11"  (1.499 m)  Body mass index is 34.01 kg/m.       Physical Examination:   General appearance: alert, well appearing, and in no distress  Mental status: alert, oriented to person, place, and time  Skin: warm & dry   Cardiovascular: normal heart rate noted   Respiratory: normal respiratory effort, no distress   Breasts: deferred, no complaints   Abdomen: soft, non-tender   Pelvic: normal external genitalia, vulva, vagina, cervix, uterus and adnexa. Thin prep pap obtained: No  Rectal: not examined  Extremities: Edema: none         No results found for this or any previous visit (from the past 24 hour(s)).  Assessment & Plan:  1) Postpartum exam 2) Five wks s/p spontaneous vaginal delivery 3)  Bottle  feeding (still has some frozen breastmilk from pumping) 4) Depression screening> mild, sees Delorise Shiner as part of addiction program @ [facility] 5) Contraception management: got DMPA @ hospital d/c; sched for next dose 6) s/p pre-e without SF, nl BP today 7) GDMA1, sched for GTT  Essential components of care per ACOG recommendations:  1.  Mood and well being:  If positive depression screen, discussed and plan developed.  If using tobacco we discussed reduction/cessation and  risk of relapse If current substance abuse, we discussed and referral to local resources was offered.   2. Infant care and feeding:  If breastfeeding, discussed returning to work, pumping, breastfeeding-associated pain, guidance regarding return to fertility while lactating if not using another method. If needed, patient was provided with a letter to be allowed to pump q 2-3hrs to support lactation in a private location with access to a refrigerator to store breastmilk.   Recommended that all caregivers be immunized for flu, pertussis and other preventable communicable diseases If pt does not  have material needs met for her/baby, referred to local resources for help obtaining these.  3. Sexuality, contraception and birth spacing Provided guidance regarding sexuality, management of dyspareunia, and resumption of intercourse Discussed avoiding interpregnancy interval <4mths and recommended birth spacing of 18 months  4. Sleep and fatigue Discussed coping options for fatigue and sleep disruption Encouraged family/partner/community support of 4 hrs of uninterrupted sleep to help with mood and fatigue  5. Physical recovery  If pt had a C/S, assessed incisional pain and providing guidance on normal vs prolonged recovery If pt had a laceration, perineal healing and pain reviewed.  If urinary or fecal incontinence, discussed management and referred to PT or uro/gyn if indicated  Patient is safe to resume physical activity. Discussed attainment of healthy weight.  6.  Chronic disease management Discussed pregnancy complications if any, and their implications for future childbearing and long-term maternal health. Review recommendations for prevention of recurrent pregnancy complications, such as 17 hydroxyprogesterone caproate to reduce risk for recurrent PTB not applicable, or aspirin to reduce risk of preeclampsia yes. Pt had GDM: yes. If yes, 2hr GTT scheduled: will schedule at check-out. Reviewed  medications and non-pregnant dosing including consideration of whether pt is breastfeeding using a reliable resource such as LactMed: not applicable Referred for f/u w/ PCP or subspecialist providers as indicated: has PCP  7. Health maintenance Mammogram at 27yo or earlier if indicated Pap smears as indicated  Meds:  Meds ordered this encounter  Medications   medroxyPROGESTERone (DEPO-PROVERA) 150 MG/ML injection    Sig: Inject 1 mL (150 mg total) into the muscle every 3 (three) months.    Dispense:  1 mL    Refill:  4    Order Specific Question:   Supervising Provider    Answer:   Myna Hidalgo [[id number]]    Follow-up: Return for sched 2hr GTT; needs RN Depo visit 12 wks from 3/28.   No orders of the defined types were placed in this encounter.   Arabella Merles CNM 04/11/2023 11:23 AM

## 2023-04-11 NOTE — Patient Instructions (Signed)
Use the website www.postpartum.net for postpartum resources. 

## 2023-04-13 ENCOUNTER — Other Ambulatory Visit: Payer: Medicaid Other

## 2023-07-04 ENCOUNTER — Ambulatory Visit (INDEPENDENT_AMBULATORY_CARE_PROVIDER_SITE_OTHER): Payer: Medicaid Other | Admitting: *Deleted

## 2023-07-04 DIAGNOSIS — Z3202 Encounter for pregnancy test, result negative: Secondary | ICD-10-CM | POA: Diagnosis not present

## 2023-07-04 DIAGNOSIS — Z3042 Encounter for surveillance of injectable contraceptive: Secondary | ICD-10-CM | POA: Diagnosis not present

## 2023-07-04 LAB — POCT URINE PREGNANCY: Preg Test, Ur: NEGATIVE

## 2023-07-04 MED ORDER — MEDROXYPROGESTERONE ACETATE 150 MG/ML IM SUSY
150.0000 mg | PREFILLED_SYRINGE | Freq: Once | INTRAMUSCULAR | Status: AC
  Administered 2023-07-04: 150 mg via INTRAMUSCULAR

## 2023-07-04 NOTE — Progress Notes (Signed)
   NURSE VISIT- INJECTION  SUBJECTIVE:  Andrea [name] is a 27 y.o. [phone] female here for a Depo Provera for contraception/period management. She is a GYN patient. Pt is late getting Depo. UPT negative in office. Pt is currently on period.   OBJECTIVE:  LMP 07/03/2023   Breastfeeding No   Appears well, in no apparent distress  Injection administered in: Left deltoid  Meds ordered this encounter  Medications   medroxyPROGESTERone Acetate SUSY 150 mg    ASSESSMENT: GYN patient Depo Provera for contraception/period management PLAN: Follow-up: in 11-13 weeks for next Depo   Malachy Mood  07/04/2023 3:33 PM

## 2023-09-04 DIAGNOSIS — F419 Anxiety disorder, unspecified: Secondary | ICD-10-CM | POA: Diagnosis not present

## 2023-09-04 DIAGNOSIS — F39 Unspecified mood [affective] disorder: Secondary | ICD-10-CM | POA: Diagnosis not present

## 2023-09-25 ENCOUNTER — Ambulatory Visit (INDEPENDENT_AMBULATORY_CARE_PROVIDER_SITE_OTHER): Payer: Medicaid Other

## 2023-09-25 DIAGNOSIS — Z3042 Encounter for surveillance of injectable contraceptive: Secondary | ICD-10-CM | POA: Diagnosis not present

## 2023-09-25 MED ORDER — MEDROXYPROGESTERONE ACETATE 150 MG/ML IM SUSY
150.0000 mg | PREFILLED_SYRINGE | Freq: Once | INTRAMUSCULAR | Status: AC
  Administered 2023-09-25: 150 mg via INTRAMUSCULAR

## 2023-09-25 NOTE — Progress Notes (Signed)
   NURSE VISIT- INJECTION  SUBJECTIVE:  Andrea [name] is a 27 y.o. [phone] female here for a Depo Provera for contraception/period management. She is a GYN patient.   OBJECTIVE:  There were no vitals taken for this visit.  Appears well, in no apparent distress  Injection administered in: Right deltoid  Meds ordered this encounter  Medications   medroxyPROGESTERone Acetate SUSY 150 mg    ASSESSMENT: GYN patient Depo Provera for contraception/period management PLAN: Follow-up: in 11-13 weeks for next Depo   Caralyn Guile  09/25/2023 3:01 PM

## 2023-09-26 ENCOUNTER — Ambulatory Visit: Payer: Medicaid Other

## 2023-11-14 DIAGNOSIS — Z79891 Long term (current) use of opiate analgesic: Secondary | ICD-10-CM | POA: Diagnosis not present

## 2023-11-14 DIAGNOSIS — Z5181 Encounter for therapeutic drug level monitoring: Secondary | ICD-10-CM | POA: Diagnosis not present

## 2023-11-14 DIAGNOSIS — Z024 Encounter for examination for driving license: Secondary | ICD-10-CM | POA: Diagnosis not present

## 2023-11-14 DIAGNOSIS — E669 Obesity, unspecified: Secondary | ICD-10-CM | POA: Diagnosis not present

## 2023-11-14 DIAGNOSIS — Z6835 Body mass index (BMI) 35.0-35.9, adult: Secondary | ICD-10-CM | POA: Diagnosis not present

## 2023-11-14 DIAGNOSIS — H6692 Otitis media, unspecified, left ear: Secondary | ICD-10-CM | POA: Diagnosis not present

## 2023-12-17 ENCOUNTER — Encounter: Payer: Self-pay | Admitting: *Deleted

## 2023-12-17 ENCOUNTER — Telehealth: Payer: Self-pay

## 2023-12-17 NOTE — Telephone Encounter (Signed)
 Patient called and wants her Depo RX sent to [facility] in [place]. She no longer uses [facility].

## 2023-12-18 ENCOUNTER — Ambulatory Visit: Payer: Medicaid Other

## 2023-12-18 ENCOUNTER — Other Ambulatory Visit: Payer: Self-pay | Admitting: Adult Health

## 2023-12-18 MED ORDER — MEDROXYPROGESTERONE ACETATE 150 MG/ML IM SUSP: 150.0000 mg | INTRAMUSCULAR | 4 refills | Status: AC

## 2023-12-18 NOTE — Progress Notes (Signed)
 Rx for depo sent to [facility] in [place]

## 2023-12-20 ENCOUNTER — Ambulatory Visit (INDEPENDENT_AMBULATORY_CARE_PROVIDER_SITE_OTHER): Payer: Medicaid Other | Admitting: *Deleted

## 2023-12-20 DIAGNOSIS — Z3042 Encounter for surveillance of injectable contraceptive: Secondary | ICD-10-CM

## 2023-12-20 MED ORDER — MEDROXYPROGESTERONE ACETATE 150 MG/ML IM SUSY
150.0000 mg | PREFILLED_SYRINGE | Freq: Once | INTRAMUSCULAR | Status: AC
  Administered 2023-12-20: 150 mg via INTRAMUSCULAR

## 2023-12-20 NOTE — Progress Notes (Signed)
   NURSE VISIT- INJECTION  SUBJECTIVE:  Andrea [name] is a 28 y.o. [phone] female here for a Depo Provera  for contraception/period management. She is a GYN patient.   OBJECTIVE:  There were no vitals taken for this visit.  Appears well, in no apparent distress  Injection administered in: Left deltoid  Meds ordered this encounter  Medications   medroxyPROGESTERone  Acetate SUSY 150 mg    ASSESSMENT: GYN patient Depo Provera  for contraception/period management PLAN: Follow-up: in 11-13 weeks for next Depo   Clarita Salt  12/20/2023 3:16 PM

## 2024-01-28 DIAGNOSIS — A09 Infectious gastroenteritis and colitis, unspecified: Secondary | ICD-10-CM | POA: Diagnosis not present

## 2024-01-28 DIAGNOSIS — R112 Nausea with vomiting, unspecified: Secondary | ICD-10-CM | POA: Diagnosis not present

## 2024-03-13 ENCOUNTER — Ambulatory Visit (INDEPENDENT_AMBULATORY_CARE_PROVIDER_SITE_OTHER): Payer: Medicaid Other | Admitting: *Deleted

## 2024-03-13 DIAGNOSIS — Z3042 Encounter for surveillance of injectable contraceptive: Secondary | ICD-10-CM

## 2024-03-13 MED ORDER — MEDROXYPROGESTERONE ACETATE 150 MG/ML IM SUSY
150.0000 mg | PREFILLED_SYRINGE | Freq: Once | INTRAMUSCULAR | Status: AC
  Administered 2024-03-13: 150 mg via INTRAMUSCULAR

## 2024-03-13 NOTE — Progress Notes (Signed)
   NURSE VISIT- INJECTION  SUBJECTIVE:  Andrea [name] is a 28 y.o. [phone] female here for a Depo Provera for contraception/period management. She is a GYN patient.   OBJECTIVE:  There were no vitals taken for this visit.  Appears well, in no apparent distress  Injection administered in: Right deltoid  Meds ordered this encounter  Medications   medroxyPROGESTERone Acetate SUSY 150 mg    ASSESSMENT: GYN patient Depo Provera for contraception/period management PLAN: Follow-up: in 11-13 weeks for next Depo   Malachy Mood  03/13/2024 2:57 PM

## 2024-06-04 ENCOUNTER — Ambulatory Visit (INDEPENDENT_AMBULATORY_CARE_PROVIDER_SITE_OTHER)

## 2024-06-04 DIAGNOSIS — Z3042 Encounter for surveillance of injectable contraceptive: Secondary | ICD-10-CM | POA: Diagnosis not present

## 2024-06-04 MED ORDER — MEDROXYPROGESTERONE ACETATE 150 MG/ML IM SUSY
150.0000 mg | PREFILLED_SYRINGE | Freq: Once | INTRAMUSCULAR | Status: AC
  Administered 2024-06-04: 150 mg via INTRAMUSCULAR

## 2024-06-04 NOTE — Progress Notes (Signed)
   NURSE VISIT- INJECTION  SUBJECTIVE:  Andrea [name] is a 28 y.o. [phone] female here for a Depo Provera  for contraception/period management. She is a GYN patient.   OBJECTIVE:  There were no vitals taken for this visit.  Appears well, in no apparent distress  Injection administered in: Left deltoid  Meds ordered this encounter  Medications   medroxyPROGESTERone  Acetate SUSY 150 mg    ASSESSMENT: GYN patient Depo Provera  for contraception/period management PLAN: Follow-up: in 11-13 weeks for next Depo   Aleck FORBES Blase  06/04/2024 3:16 PM

## 2024-06-05 ENCOUNTER — Ambulatory Visit

## 2024-08-26 ENCOUNTER — Ambulatory Visit

## 2024-08-27 ENCOUNTER — Ambulatory Visit (INDEPENDENT_AMBULATORY_CARE_PROVIDER_SITE_OTHER): Payer: Self-pay | Admitting: *Deleted

## 2024-08-27 DIAGNOSIS — Z3042 Encounter for surveillance of injectable contraceptive: Secondary | ICD-10-CM

## 2024-08-27 MED ORDER — MEDROXYPROGESTERONE ACETATE 150 MG/ML IM SUSY
150.0000 mg | PREFILLED_SYRINGE | Freq: Once | INTRAMUSCULAR | Status: AC
  Administered 2024-08-27: 150 mg via INTRAMUSCULAR

## 2024-08-27 NOTE — Progress Notes (Signed)
   NURSE VISIT- INJECTION  SUBJECTIVE:  Andrea [name] is a 28 y.o. [phone] female here for a Depo Provera  for contraception/period management. She is a GYN patient.   OBJECTIVE:  There were no vitals taken for this visit.  Appears well, in no apparent distress  Injection administered in: Right deltoid  Meds ordered this encounter  Medications   medroxyPROGESTERone  Acetate SUSY 150 mg    ASSESSMENT: GYN patient Depo Provera  for contraception/period management PLAN: Follow-up: in 11-13 weeks for next Depo   Alan LITTIE Fischer  08/27/2024 3:15 PM

## 2024-11-19 ENCOUNTER — Ambulatory Visit

## 2024-11-26 ENCOUNTER — Ambulatory Visit

## 2024-12-01 ENCOUNTER — Ambulatory Visit

## 2024-12-01 DIAGNOSIS — Z3202 Encounter for pregnancy test, result negative: Secondary | ICD-10-CM | POA: Diagnosis not present

## 2024-12-01 DIAGNOSIS — Z3042 Encounter for surveillance of injectable contraceptive: Secondary | ICD-10-CM

## 2024-12-01 LAB — POCT URINE PREGNANCY: Preg Test, Ur: NEGATIVE

## 2024-12-01 MED ORDER — MEDROXYPROGESTERONE ACETATE 150 MG/ML IM SUSY
150.0000 mg | PREFILLED_SYRINGE | Freq: Once | INTRAMUSCULAR | Status: AC
  Administered 2024-12-01: 150 mg via INTRAMUSCULAR

## 2024-12-01 NOTE — Progress Notes (Signed)
" ° °  NURSE VISIT- INJECTION  SUBJECTIVE:  Andrea [name] is a 28 y.o. [phone] female here for a Depo Provera  for contraception/period management. She is a GYN patient. Pt's Depo was due between 11/12/24-11/26/24. UPT was negative.   OBJECTIVE:  There were no vitals taken for this visit.  Appears well, in no apparent distress  Injection administered in: Left deltoid  Meds ordered this encounter  Medications   medroxyPROGESTERone  Acetate SUSY 150 mg    ASSESSMENT: GYN patient Depo Provera  for contraception/period management PLAN: Follow-up: in 11-13 weeks for next Depo   Clarita Salt  12/01/2024 2:50 PM  "

## 2025-01-06 ENCOUNTER — Ambulatory Visit
Admission: EM | Admit: 2025-01-06 | Discharge: 2025-01-06 | Disposition: A | Discharge status: Home / Self Care | Attending: Internal Medicine | Admitting: Internal Medicine

## 2025-01-06 DIAGNOSIS — J111 Influenza due to unidentified influenza virus with other respiratory manifestations: Secondary | ICD-10-CM

## 2025-01-06 DIAGNOSIS — J069 Acute upper respiratory infection, unspecified: Secondary | ICD-10-CM

## 2025-01-06 DIAGNOSIS — J4521 Mild intermittent asthma with (acute) exacerbation: Secondary | ICD-10-CM

## 2025-01-06 LAB — POC COVID19/FLU A&B COMBO
Covid Antigen, POC: NEGATIVE
Influenza A Antigen, POC: NEGATIVE
Influenza B Antigen, POC: NEGATIVE

## 2025-01-06 MED ORDER — ONDANSETRON 4 MG PO TBDP: 4.0000 mg | ORAL_TABLET | Freq: Three times a day (TID) | ORAL | 0 refills | Status: AC | PRN

## 2025-01-06 MED ORDER — PREDNISONE 20 MG PO TABS: 40.0000 mg | ORAL_TABLET | Freq: Every day | ORAL | 0 refills | Status: AC

## 2025-01-06 MED ORDER — PROMETHAZINE-DM 6.25-15 MG/5ML PO SYRP: 5.0000 mL | ORAL_SOLUTION | Freq: Every evening | ORAL | 0 refills | Status: AC | PRN

## 2025-01-06 NOTE — Discharge Instructions (Addendum)
 Your symptoms are due to asthma attack.  - Use albuterol  inhaler 2 puffs every 4-6 hours on a schedule for the next 24 hours, then as needed for cough, shortness of breath, and wheeze. - Take the steroid sent to the pharmacy as directed to help reduce lung inflammation and decrease the risk of another attack in the next few days. No NSAIDs like ibuprofen  or aleve /naproxen  while taking steroid. Take with food to avoid stomach upset. - Cough medication as needed. Promethazine  DM will cause drowsiness, only use this at bedtime.  - Zofran  every 8 hours as needed for nausea/vomiting.   If your symptoms do not improve in the next 2-3 days with interventions, please return. Please seek medical care for new or returning symptoms, such as difficulty breathing that doesn't improve with your medications, chest pain, voice changes, high fevers, confusion, or other new or worsening symptoms. Follow-up with PCP for ongoing management of asthma.

## 2025-01-06 NOTE — ED Triage Notes (Signed)
 Pt reports cough, congestion, headache, body aches, nausea, vomiting, diarrhea and fever started Friday.
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
## 2025-01-16 ENCOUNTER — Other Ambulatory Visit (HOSPITAL_BASED_OUTPATIENT_CLINIC_OR_DEPARTMENT_OTHER): Payer: Self-pay

## 2025-01-16 ENCOUNTER — Other Ambulatory Visit: Payer: Self-pay

## 2025-01-16 MED ORDER — ESCITALOPRAM OXALATE 20 MG PO TABS: 20.0000 mg | ORAL_TABLET | Freq: Every day | ORAL | 2 refills | Status: AC

## 2025-01-16 MED ORDER — TRAZODONE HCL 50 MG PO TABS: 25.0000 mg | ORAL_TABLET | Freq: Every day | ORAL | 2 refills | Status: AC | PRN

## 2025-01-16 MED ORDER — LAMOTRIGINE 150 MG PO TABS: 150.0000 mg | ORAL_TABLET | Freq: Every day | ORAL | 2 refills | Status: AC

## 2025-02-23 ENCOUNTER — Ambulatory Visit: Payer: Self-pay
